# Patient Record
Sex: Female | Born: 1935 | Race: White | Hispanic: No | Marital: Married | Smoking: Current every day smoker
Health system: Southern US, Community
[De-identification: ages and names within clinical notes are randomized; demographics above are authoritative.]

## PROBLEM LIST (undated history)

## (undated) DIAGNOSIS — E559 Vitamin D deficiency, unspecified: Secondary | ICD-10-CM

## (undated) DIAGNOSIS — K2970 Gastritis, unspecified, without bleeding: Secondary | ICD-10-CM

## (undated) DIAGNOSIS — I341 Nonrheumatic mitral (valve) prolapse: Secondary | ICD-10-CM

## (undated) DIAGNOSIS — F0390 Unspecified dementia without behavioral disturbance: Secondary | ICD-10-CM

## (undated) DIAGNOSIS — Z720 Tobacco use: Secondary | ICD-10-CM

## (undated) DIAGNOSIS — IMO0002 Reserved for concepts with insufficient information to code with codable children: Secondary | ICD-10-CM

## (undated) DIAGNOSIS — F1010 Alcohol abuse, uncomplicated: Secondary | ICD-10-CM

## (undated) DIAGNOSIS — S72141A Displaced intertrochanteric fracture of right femur, initial encounter for closed fracture: Secondary | ICD-10-CM

## (undated) DIAGNOSIS — I219 Acute myocardial infarction, unspecified: Secondary | ICD-10-CM

## (undated) DIAGNOSIS — H3530 Unspecified macular degeneration: Secondary | ICD-10-CM

## (undated) DIAGNOSIS — G8929 Other chronic pain: Secondary | ICD-10-CM

## (undated) DIAGNOSIS — E43 Unspecified severe protein-calorie malnutrition: Secondary | ICD-10-CM

## (undated) DIAGNOSIS — R634 Abnormal weight loss: Secondary | ICD-10-CM

## (undated) DIAGNOSIS — F32A Depression, unspecified: Secondary | ICD-10-CM

## (undated) DIAGNOSIS — K5790 Diverticulosis of intestine, part unspecified, without perforation or abscess without bleeding: Secondary | ICD-10-CM

## (undated) DIAGNOSIS — I480 Paroxysmal atrial fibrillation: Secondary | ICD-10-CM

## (undated) DIAGNOSIS — G4700 Insomnia, unspecified: Secondary | ICD-10-CM

## (undated) DIAGNOSIS — F341 Dysthymic disorder: Secondary | ICD-10-CM

## (undated) DIAGNOSIS — I10 Essential (primary) hypertension: Secondary | ICD-10-CM

## (undated) DIAGNOSIS — W19XXXA Unspecified fall, initial encounter: Secondary | ICD-10-CM

## (undated) DIAGNOSIS — I452 Bifascicular block: Secondary | ICD-10-CM

## (undated) DIAGNOSIS — M1990 Unspecified osteoarthritis, unspecified site: Secondary | ICD-10-CM

## (undated) DIAGNOSIS — E119 Type 2 diabetes mellitus without complications: Secondary | ICD-10-CM

## (undated) DIAGNOSIS — M549 Dorsalgia, unspecified: Secondary | ICD-10-CM

## (undated) DIAGNOSIS — E785 Hyperlipidemia, unspecified: Secondary | ICD-10-CM

## (undated) DIAGNOSIS — M519 Unspecified thoracic, thoracolumbar and lumbosacral intervertebral disc disorder: Secondary | ICD-10-CM

## (undated) DIAGNOSIS — J449 Chronic obstructive pulmonary disease, unspecified: Secondary | ICD-10-CM

## (undated) DIAGNOSIS — F329 Major depressive disorder, single episode, unspecified: Secondary | ICD-10-CM

## (undated) HISTORY — DX: Reserved for concepts with insufficient information to code with codable children: IMO0002

## (undated) HISTORY — DX: Gastritis, unspecified, without bleeding: K29.70

## (undated) HISTORY — DX: Dorsalgia, unspecified: M54.9

## (undated) HISTORY — DX: Unspecified osteoarthritis, unspecified site: M19.90

## (undated) HISTORY — DX: Displaced intertrochanteric fracture of right femur, initial encounter for closed fracture: S72.141A

## (undated) HISTORY — PX: OTHER SURGICAL HISTORY: SHX169

## (undated) HISTORY — DX: Dysthymic disorder: F34.1

## (undated) HISTORY — PX: KNEE ARTHROSCOPY: SUR90

## (undated) HISTORY — DX: Vitamin D deficiency, unspecified: E55.9

## (undated) HISTORY — DX: Unspecified macular degeneration: H35.30

## (undated) HISTORY — DX: Insomnia, unspecified: G47.00

## (undated) HISTORY — DX: Unspecified dementia without behavioral disturbance: F03.90

## (undated) HISTORY — DX: Hyperlipidemia, unspecified: E78.5

## (undated) HISTORY — DX: Major depressive disorder, single episode, unspecified: F32.9

## (undated) HISTORY — DX: Abnormal weight loss: R63.4

## (undated) HISTORY — DX: Acute myocardial infarction, unspecified: I21.9

## (undated) HISTORY — DX: Alcohol abuse, uncomplicated: F10.10

## (undated) HISTORY — DX: Paroxysmal atrial fibrillation: I48.0

## (undated) HISTORY — DX: Other chronic pain: G89.29

## (undated) HISTORY — DX: Unspecified fall, initial encounter: W19.XXXA

## (undated) HISTORY — DX: Diverticulosis of intestine, part unspecified, without perforation or abscess without bleeding: K57.90

## (undated) HISTORY — DX: Nonrheumatic mitral (valve) prolapse: I34.1

## (undated) HISTORY — DX: Unspecified thoracic, thoracolumbar and lumbosacral intervertebral disc disorder: M51.9

## (undated) HISTORY — DX: Bifascicular block: I45.2

## (undated) HISTORY — DX: Chronic obstructive pulmonary disease, unspecified: J44.9

## (undated) HISTORY — DX: Tobacco use: Z72.0

## (undated) HISTORY — DX: Type 2 diabetes mellitus without complications: E11.9

## (undated) HISTORY — DX: Essential (primary) hypertension: I10

## (undated) HISTORY — DX: Depression, unspecified: F32.A

## (undated) HISTORY — DX: Unspecified severe protein-calorie malnutrition: E43

---

## 1997-09-01 ENCOUNTER — Inpatient Hospital Stay (HOSPITAL_COMMUNITY): Admission: EM | Admit: 1997-09-01 | Discharge: 1997-09-04 | Payer: Self-pay | Admitting: Emergency Medicine

## 1999-01-12 ENCOUNTER — Other Ambulatory Visit: Admission: RE | Admit: 1999-01-12 | Discharge: 1999-01-12 | Payer: Self-pay | Admitting: *Deleted

## 1999-06-17 ENCOUNTER — Encounter: Payer: Self-pay | Admitting: Family Medicine

## 1999-06-17 ENCOUNTER — Encounter: Admission: RE | Admit: 1999-06-17 | Discharge: 1999-06-17 | Payer: Self-pay | Admitting: Family Medicine

## 2001-12-14 ENCOUNTER — Encounter: Payer: Self-pay | Admitting: Orthopedic Surgery

## 2001-12-14 ENCOUNTER — Encounter: Admission: RE | Admit: 2001-12-14 | Discharge: 2001-12-14 | Payer: Self-pay | Admitting: Orthopedic Surgery

## 2001-12-27 ENCOUNTER — Encounter: Admission: RE | Admit: 2001-12-27 | Discharge: 2001-12-27 | Payer: Self-pay | Admitting: Orthopedic Surgery

## 2001-12-27 ENCOUNTER — Encounter: Payer: Self-pay | Admitting: Orthopedic Surgery

## 2002-01-17 ENCOUNTER — Encounter: Payer: Self-pay | Admitting: Orthopedic Surgery

## 2002-01-17 ENCOUNTER — Encounter: Admission: RE | Admit: 2002-01-17 | Discharge: 2002-01-17 | Payer: Self-pay | Admitting: Orthopedic Surgery

## 2002-02-07 ENCOUNTER — Encounter: Admission: RE | Admit: 2002-02-07 | Discharge: 2002-02-07 | Payer: Self-pay | Admitting: Orthopedic Surgery

## 2002-02-07 ENCOUNTER — Encounter: Payer: Self-pay | Admitting: Orthopedic Surgery

## 2003-01-15 ENCOUNTER — Encounter: Admission: RE | Admit: 2003-01-15 | Discharge: 2003-01-15 | Payer: Self-pay | Admitting: Orthopedic Surgery

## 2003-01-15 ENCOUNTER — Encounter: Payer: Self-pay | Admitting: Orthopedic Surgery

## 2003-02-06 ENCOUNTER — Encounter: Admission: RE | Admit: 2003-02-06 | Discharge: 2003-02-06 | Payer: Self-pay | Admitting: Orthopedic Surgery

## 2003-02-06 ENCOUNTER — Encounter: Payer: Self-pay | Admitting: Orthopedic Surgery

## 2003-03-21 ENCOUNTER — Encounter: Admission: RE | Admit: 2003-03-21 | Discharge: 2003-03-21 | Payer: Self-pay | Admitting: Orthopedic Surgery

## 2003-03-25 ENCOUNTER — Emergency Department (HOSPITAL_COMMUNITY): Admission: EM | Admit: 2003-03-25 | Discharge: 2003-03-25 | Payer: Self-pay | Admitting: Emergency Medicine

## 2004-03-16 ENCOUNTER — Ambulatory Visit: Payer: Self-pay | Admitting: Gastroenterology

## 2004-03-30 ENCOUNTER — Encounter (INDEPENDENT_AMBULATORY_CARE_PROVIDER_SITE_OTHER): Payer: Self-pay | Admitting: *Deleted

## 2004-03-30 ENCOUNTER — Ambulatory Visit: Payer: Self-pay | Admitting: Gastroenterology

## 2004-03-31 ENCOUNTER — Ambulatory Visit: Payer: Self-pay | Admitting: Gastroenterology

## 2004-05-27 ENCOUNTER — Ambulatory Visit: Payer: Self-pay | Admitting: Family Medicine

## 2004-06-28 ENCOUNTER — Ambulatory Visit: Payer: Self-pay

## 2005-09-08 ENCOUNTER — Ambulatory Visit: Payer: Self-pay | Admitting: Family Medicine

## 2006-03-02 ENCOUNTER — Ambulatory Visit: Payer: Self-pay | Admitting: Family Medicine

## 2006-03-02 LAB — CONVERTED CEMR LAB
ALT: 24 units/L (ref 0–40)
AST: 34 units/L (ref 0–37)
BUN: 11 mg/dL (ref 6–23)
Basophils Relative: 0.4 % (ref 0.0–1.0)
CO2: 30 meq/L (ref 19–32)
Calcium: 9.5 mg/dL (ref 8.4–10.5)
Chloride: 98 meq/L (ref 96–112)
Chol/HDL Ratio, serum: 2.7
Creatinine, Ser: 0.6 mg/dL (ref 0.4–1.2)
Eosinophil percent: 2.4 % (ref 0.0–5.0)
Microalb Creat Ratio: 15.5 mg/g (ref 0.0–30.0)
Microalb, Ur: 2.9 mg/dL — ABNORMAL HIGH (ref 0.0–1.9)
Platelets: 267 10*3/uL (ref 150–400)
RBC: 3.82 M/uL — ABNORMAL LOW (ref 3.87–5.11)
RDW: 12.5 % (ref 11.5–14.6)
Triglyceride fasting, serum: 189 mg/dL — ABNORMAL HIGH (ref 0–149)
VLDL: 38 mg/dL (ref 0–40)
WBC: 9.3 10*3/uL (ref 4.5–10.5)

## 2006-12-05 ENCOUNTER — Telehealth (INDEPENDENT_AMBULATORY_CARE_PROVIDER_SITE_OTHER): Payer: Self-pay | Admitting: *Deleted

## 2006-12-12 ENCOUNTER — Encounter: Payer: Self-pay | Admitting: Family Medicine

## 2007-01-25 ENCOUNTER — Telehealth: Payer: Self-pay | Admitting: Family Medicine

## 2007-02-06 ENCOUNTER — Telehealth: Payer: Self-pay | Admitting: Family Medicine

## 2007-03-08 ENCOUNTER — Telehealth: Payer: Self-pay | Admitting: Family Medicine

## 2007-03-22 ENCOUNTER — Telehealth: Payer: Self-pay | Admitting: Family Medicine

## 2007-03-27 ENCOUNTER — Telehealth: Payer: Self-pay | Admitting: Family Medicine

## 2007-04-09 ENCOUNTER — Telehealth: Payer: Self-pay | Admitting: Family Medicine

## 2007-04-10 ENCOUNTER — Telehealth: Payer: Self-pay | Admitting: Family Medicine

## 2007-05-06 ENCOUNTER — Encounter: Payer: Self-pay | Admitting: Family Medicine

## 2007-05-16 ENCOUNTER — Telehealth: Payer: Self-pay | Admitting: Family Medicine

## 2007-05-23 ENCOUNTER — Telehealth: Payer: Self-pay | Admitting: Family Medicine

## 2007-05-30 ENCOUNTER — Ambulatory Visit: Payer: Self-pay | Admitting: Family Medicine

## 2007-05-30 DIAGNOSIS — M129 Arthropathy, unspecified: Secondary | ICD-10-CM | POA: Insufficient documentation

## 2007-05-30 DIAGNOSIS — E785 Hyperlipidemia, unspecified: Secondary | ICD-10-CM

## 2007-05-30 DIAGNOSIS — M5137 Other intervertebral disc degeneration, lumbosacral region: Secondary | ICD-10-CM | POA: Insufficient documentation

## 2007-05-30 DIAGNOSIS — D649 Anemia, unspecified: Secondary | ICD-10-CM | POA: Insufficient documentation

## 2007-05-30 DIAGNOSIS — T50995A Adverse effect of other drugs, medicaments and biological substances, initial encounter: Secondary | ICD-10-CM | POA: Insufficient documentation

## 2007-05-30 HISTORY — DX: Hyperlipidemia, unspecified: E78.5

## 2007-05-31 ENCOUNTER — Telehealth: Payer: Self-pay | Admitting: Family Medicine

## 2007-06-04 LAB — CONVERTED CEMR LAB
BUN: 10 mg/dL (ref 6–23)
Basophils Absolute: 0 10*3/uL (ref 0.0–0.1)
Bilirubin, Direct: 0.2 mg/dL (ref 0.0–0.3)
Chloride: 97 meq/L (ref 96–112)
Cholesterol: 126 mg/dL (ref 0–200)
Creatinine, Ser: 0.6 mg/dL (ref 0.4–1.2)
GFR calc Af Amer: 126 mL/min
Glucose, Bld: 100 mg/dL — ABNORMAL HIGH (ref 70–99)
HDL: 41.6 mg/dL (ref >39.0)
Hemoglobin: 15.6 g/dL — ABNORMAL HIGH (ref 12.0–15.0)
Hgb A1c MFr Bld: 6.1 % — ABNORMAL HIGH (ref 4.6–6.0)
LDL Cholesterol: 61 mg/dL (ref 0–99)
MCHC: 36 g/dL (ref 30.0–36.0)
Monocytes Absolute: 0.8 10*3/uL — ABNORMAL HIGH (ref 0.2–0.7)
Monocytes Relative: 8.5 % (ref 3.0–11.0)
Neutro Abs: 6.2 10*3/uL (ref 1.4–7.7)
Potassium: 4.9 meq/L (ref 3.5–5.1)
RDW: 12.1 % (ref 11.5–14.6)
Sodium: 139 meq/L (ref 135–145)
Total Bilirubin: 1.2 mg/dL (ref 0.3–1.2)
Total Protein: 7.3 g/dL (ref 6.0–8.3)

## 2007-06-05 ENCOUNTER — Telehealth: Payer: Self-pay | Admitting: Family Medicine

## 2007-07-03 ENCOUNTER — Telehealth: Payer: Self-pay | Admitting: Family Medicine

## 2007-07-17 ENCOUNTER — Telehealth: Payer: Self-pay | Admitting: Family Medicine

## 2007-07-18 ENCOUNTER — Ambulatory Visit: Payer: Self-pay | Admitting: Cardiology

## 2007-07-18 ENCOUNTER — Inpatient Hospital Stay (HOSPITAL_COMMUNITY): Admission: EM | Admit: 2007-07-18 | Discharge: 2007-07-21 | Payer: Self-pay | Admitting: Emergency Medicine

## 2007-07-18 ENCOUNTER — Ambulatory Visit: Payer: Self-pay | Admitting: Family Medicine

## 2007-07-18 DIAGNOSIS — I4891 Unspecified atrial fibrillation: Secondary | ICD-10-CM

## 2007-07-20 ENCOUNTER — Encounter: Payer: Self-pay | Admitting: Cardiovascular Disease

## 2007-07-24 ENCOUNTER — Ambulatory Visit: Payer: Self-pay | Admitting: Cardiology

## 2007-07-26 ENCOUNTER — Telehealth: Payer: Self-pay | Admitting: Family Medicine

## 2007-07-30 ENCOUNTER — Ambulatory Visit: Payer: Self-pay | Admitting: Internal Medicine

## 2007-08-06 ENCOUNTER — Telehealth: Payer: Self-pay | Admitting: Family Medicine

## 2007-08-20 ENCOUNTER — Ambulatory Visit: Payer: Self-pay | Admitting: Cardiovascular Disease

## 2007-08-22 ENCOUNTER — Telehealth: Payer: Self-pay | Admitting: Family Medicine

## 2007-08-31 ENCOUNTER — Ambulatory Visit: Payer: Self-pay | Admitting: Internal Medicine

## 2007-08-31 ENCOUNTER — Encounter: Payer: Self-pay | Admitting: Family Medicine

## 2007-09-03 ENCOUNTER — Telehealth: Payer: Self-pay | Admitting: Family Medicine

## 2007-09-04 ENCOUNTER — Telehealth: Payer: Self-pay | Admitting: Family Medicine

## 2007-09-05 ENCOUNTER — Ambulatory Visit: Payer: Self-pay

## 2007-09-05 ENCOUNTER — Ambulatory Visit: Payer: Self-pay | Admitting: Cardiology

## 2007-09-07 ENCOUNTER — Ambulatory Visit: Payer: Self-pay | Admitting: Cardiovascular Disease

## 2007-09-07 ENCOUNTER — Inpatient Hospital Stay (HOSPITAL_COMMUNITY): Admission: EM | Admit: 2007-09-07 | Discharge: 2007-09-18 | Payer: Self-pay | Admitting: Emergency Medicine

## 2007-09-12 ENCOUNTER — Ambulatory Visit: Payer: Self-pay | Admitting: Physical Medicine & Rehabilitation

## 2007-09-18 ENCOUNTER — Telehealth: Payer: Self-pay | Admitting: Family Medicine

## 2007-09-19 ENCOUNTER — Telehealth: Payer: Self-pay | Admitting: Family Medicine

## 2007-09-20 ENCOUNTER — Ambulatory Visit: Payer: Self-pay | Admitting: Internal Medicine

## 2007-09-24 ENCOUNTER — Telehealth: Payer: Self-pay | Admitting: Family Medicine

## 2007-09-26 ENCOUNTER — Ambulatory Visit: Payer: Self-pay | Admitting: Cardiology

## 2007-09-27 ENCOUNTER — Telehealth: Payer: Self-pay | Admitting: Family Medicine

## 2007-10-16 ENCOUNTER — Ambulatory Visit: Payer: Self-pay | Admitting: Internal Medicine

## 2007-10-16 ENCOUNTER — Ambulatory Visit: Payer: Self-pay | Admitting: Cardiology

## 2007-10-31 ENCOUNTER — Ambulatory Visit: Payer: Self-pay | Admitting: Internal Medicine

## 2007-11-06 ENCOUNTER — Telehealth: Payer: Self-pay | Admitting: Family Medicine

## 2007-11-30 ENCOUNTER — Ambulatory Visit: Payer: Self-pay | Admitting: Cardiology

## 2007-12-18 ENCOUNTER — Ambulatory Visit: Payer: Self-pay | Admitting: Cardiology

## 2007-12-31 ENCOUNTER — Ambulatory Visit: Payer: Self-pay | Admitting: Cardiology

## 2008-01-08 ENCOUNTER — Telehealth: Payer: Self-pay | Admitting: Family Medicine

## 2008-01-24 ENCOUNTER — Ambulatory Visit: Payer: Self-pay | Admitting: Cardiology

## 2008-02-13 ENCOUNTER — Telehealth: Payer: Self-pay | Admitting: Family Medicine

## 2008-02-19 ENCOUNTER — Telehealth: Payer: Self-pay | Admitting: Family Medicine

## 2008-02-22 ENCOUNTER — Ambulatory Visit: Payer: Self-pay | Admitting: Internal Medicine

## 2008-03-14 ENCOUNTER — Ambulatory Visit: Payer: Self-pay | Admitting: Internal Medicine

## 2008-03-20 ENCOUNTER — Ambulatory Visit: Payer: Self-pay | Admitting: Family Medicine

## 2008-03-20 DIAGNOSIS — J309 Allergic rhinitis, unspecified: Secondary | ICD-10-CM | POA: Insufficient documentation

## 2008-03-20 DIAGNOSIS — E559 Vitamin D deficiency, unspecified: Secondary | ICD-10-CM | POA: Insufficient documentation

## 2008-03-20 DIAGNOSIS — M954 Acquired deformity of chest and rib: Secondary | ICD-10-CM | POA: Insufficient documentation

## 2008-03-20 DIAGNOSIS — M545 Low back pain: Secondary | ICD-10-CM | POA: Insufficient documentation

## 2008-03-20 DIAGNOSIS — E039 Hypothyroidism, unspecified: Secondary | ICD-10-CM | POA: Insufficient documentation

## 2008-03-20 DIAGNOSIS — E1149 Type 2 diabetes mellitus with other diabetic neurological complication: Secondary | ICD-10-CM | POA: Insufficient documentation

## 2008-03-20 HISTORY — DX: Vitamin D deficiency, unspecified: E55.9

## 2008-03-20 LAB — CONVERTED CEMR LAB
Blood in Urine, dipstick: NEGATIVE
Nitrite: NEGATIVE
Urobilinogen, UA: 0.2

## 2008-03-21 ENCOUNTER — Telehealth: Payer: Self-pay | Admitting: Family Medicine

## 2008-03-25 ENCOUNTER — Telehealth: Payer: Self-pay | Admitting: Family Medicine

## 2008-03-26 LAB — CONVERTED CEMR LAB
AST: 46 units/L — ABNORMAL HIGH (ref 0–37)
Albumin: 4.3 g/dL (ref 3.5–5.2)
Alkaline Phosphatase: 87 units/L (ref 39–117)
BUN: 17 mg/dL (ref 6–23)
Basophils Absolute: 0 10*3/uL (ref 0.0–0.1)
Chloride: 102 meq/L (ref 96–112)
Creatinine, Ser: 0.5 mg/dL (ref 0.4–1.2)
Eosinophils Absolute: 0.2 10*3/uL (ref 0.0–0.7)
Eosinophils Relative: 2.9 % (ref 0.0–5.0)
Glucose, Bld: 125 mg/dL — ABNORMAL HIGH (ref 70–99)
HDL: 49.2 mg/dL (ref >39.0)
MCHC: 35.6 g/dL (ref 30.0–36.0)
MCV: 103.4 fL — ABNORMAL HIGH (ref 78.0–100.0)
Neutrophils Relative %: 53.2 % (ref 43.0–77.0)
Platelets: 192 10*3/uL (ref 150–400)
Potassium: 4.5 meq/L (ref 3.5–5.1)
Total Protein: 7.1 g/dL (ref 6.0–8.3)
VLDL: 22 mg/dL (ref 0–40)
Vit D, 1,25-Dihydroxy: 12 — ABNORMAL LOW (ref 30–89)
WBC: 8.2 10*3/uL (ref 4.5–10.5)

## 2008-04-03 ENCOUNTER — Ambulatory Visit: Payer: Self-pay | Admitting: Cardiology

## 2008-04-03 ENCOUNTER — Telehealth: Payer: Self-pay | Admitting: Family Medicine

## 2008-04-25 ENCOUNTER — Ambulatory Visit: Payer: Self-pay | Admitting: Cardiovascular Disease

## 2008-05-15 ENCOUNTER — Ambulatory Visit: Payer: Self-pay | Admitting: Family Medicine

## 2008-05-23 ENCOUNTER — Ambulatory Visit: Payer: Self-pay | Admitting: Cardiology

## 2008-06-04 ENCOUNTER — Telehealth: Payer: Self-pay | Admitting: Family Medicine

## 2008-06-18 ENCOUNTER — Ambulatory Visit: Payer: Self-pay | Admitting: Cardiology

## 2008-06-27 ENCOUNTER — Ambulatory Visit: Payer: Self-pay | Admitting: Internal Medicine

## 2008-07-11 ENCOUNTER — Ambulatory Visit: Payer: Self-pay | Admitting: Internal Medicine

## 2008-07-11 ENCOUNTER — Ambulatory Visit: Payer: Self-pay

## 2008-07-29 ENCOUNTER — Ambulatory Visit: Payer: Self-pay | Admitting: Internal Medicine

## 2008-08-12 ENCOUNTER — Telehealth: Payer: Self-pay | Admitting: Family Medicine

## 2008-08-19 ENCOUNTER — Telehealth: Payer: Self-pay | Admitting: Family Medicine

## 2008-08-19 ENCOUNTER — Ambulatory Visit: Payer: Self-pay | Admitting: Cardiovascular Disease

## 2008-09-16 ENCOUNTER — Ambulatory Visit: Payer: Self-pay | Admitting: Internal Medicine

## 2008-09-24 ENCOUNTER — Telehealth: Payer: Self-pay | Admitting: Family Medicine

## 2008-09-30 ENCOUNTER — Telehealth: Payer: Self-pay | Admitting: Family Medicine

## 2008-10-09 ENCOUNTER — Telehealth: Payer: Self-pay | Admitting: Family Medicine

## 2008-10-10 ENCOUNTER — Ambulatory Visit: Payer: Self-pay | Admitting: Internal Medicine

## 2008-10-15 ENCOUNTER — Encounter: Payer: Self-pay | Admitting: *Deleted

## 2008-11-06 ENCOUNTER — Ambulatory Visit: Payer: Self-pay | Admitting: Internal Medicine

## 2008-11-13 ENCOUNTER — Telehealth (INDEPENDENT_AMBULATORY_CARE_PROVIDER_SITE_OTHER): Payer: Self-pay | Admitting: *Deleted

## 2008-11-19 ENCOUNTER — Encounter: Payer: Self-pay | Admitting: *Deleted

## 2008-12-01 ENCOUNTER — Telehealth: Payer: Self-pay | Admitting: Family Medicine

## 2008-12-02 ENCOUNTER — Ambulatory Visit: Payer: Self-pay | Admitting: Cardiology

## 2008-12-02 LAB — CONVERTED CEMR LAB: POC INR: 2.5

## 2008-12-30 ENCOUNTER — Ambulatory Visit: Payer: Self-pay | Admitting: Cardiovascular Disease

## 2008-12-30 LAB — CONVERTED CEMR LAB: POC INR: 2.8

## 2009-01-27 ENCOUNTER — Telehealth: Payer: Self-pay | Admitting: Family Medicine

## 2009-01-29 ENCOUNTER — Ambulatory Visit: Payer: Self-pay | Admitting: Cardiology

## 2009-02-10 ENCOUNTER — Telehealth: Payer: Self-pay | Admitting: Family Medicine

## 2009-02-18 ENCOUNTER — Telehealth: Payer: Self-pay | Admitting: Family Medicine

## 2009-02-24 ENCOUNTER — Encounter (INDEPENDENT_AMBULATORY_CARE_PROVIDER_SITE_OTHER): Payer: Self-pay | Admitting: *Deleted

## 2009-03-11 ENCOUNTER — Ambulatory Visit: Payer: Self-pay | Admitting: Internal Medicine

## 2009-03-11 ENCOUNTER — Ambulatory Visit: Payer: Self-pay | Admitting: Cardiology

## 2009-03-11 DIAGNOSIS — I447 Left bundle-branch block, unspecified: Secondary | ICD-10-CM

## 2009-03-11 DIAGNOSIS — I495 Sick sinus syndrome: Secondary | ICD-10-CM

## 2009-03-12 ENCOUNTER — Encounter (INDEPENDENT_AMBULATORY_CARE_PROVIDER_SITE_OTHER): Payer: Self-pay | Admitting: *Deleted

## 2009-03-20 ENCOUNTER — Encounter (INDEPENDENT_AMBULATORY_CARE_PROVIDER_SITE_OTHER): Payer: Self-pay | Admitting: *Deleted

## 2009-03-24 ENCOUNTER — Telehealth: Payer: Self-pay | Admitting: Family Medicine

## 2009-04-01 ENCOUNTER — Telehealth: Payer: Self-pay | Admitting: Family Medicine

## 2009-04-13 ENCOUNTER — Encounter: Payer: Self-pay | Admitting: Family Medicine

## 2009-04-16 ENCOUNTER — Ambulatory Visit: Payer: Self-pay | Admitting: Cardiovascular Disease

## 2009-04-23 ENCOUNTER — Encounter (INDEPENDENT_AMBULATORY_CARE_PROVIDER_SITE_OTHER): Payer: Self-pay | Admitting: *Deleted

## 2009-04-29 ENCOUNTER — Ambulatory Visit: Payer: Self-pay | Admitting: Family Medicine

## 2009-04-29 LAB — CONVERTED CEMR LAB
Bilirubin Urine: NEGATIVE
Blood in Urine, dipstick: NEGATIVE
Glucose, Urine, Semiquant: NEGATIVE
Ketones, urine, test strip: NEGATIVE
Nitrite: NEGATIVE
Specific Gravity, Urine: 1.02
pH: 6

## 2009-05-06 LAB — CONVERTED CEMR LAB
Albumin: 4.3 g/dL (ref 3.5–5.2)
Alkaline Phosphatase: 57 units/L (ref 39–117)
Basophils Relative: 0.2 % (ref 0.0–3.0)
CO2: 31 meq/L (ref 19–32)
Calcium: 9.4 mg/dL (ref 8.4–10.5)
Chloride: 102 meq/L (ref 96–112)
Cholesterol: 131 mg/dL (ref 0–200)
Eosinophils Absolute: 0.2 10*3/uL (ref 0.0–0.7)
Glucose, Bld: 78 mg/dL (ref 70–99)
HCT: 43.9 % (ref 36.0–46.0)
HDL: 46.2 mg/dL (ref >39.00)
Hemoglobin: 14.7 g/dL (ref 12.0–15.0)
Lymphocytes Relative: 25.8 % (ref 12.0–46.0)
Lymphs Abs: 2.2 10*3/uL (ref 0.7–4.0)
MCHC: 33.4 g/dL (ref 30.0–36.0)
MCV: 106.9 fL — ABNORMAL HIGH (ref 78.0–100.0)
Microalb, Ur: 1 mg/dL (ref 0.0–1.9)
Neutro Abs: 5.2 10*3/uL (ref 1.4–7.7)
Potassium: 4.9 meq/L (ref 3.5–5.1)
RBC: 4.11 M/uL (ref 3.87–5.11)
RDW: 12.8 % (ref 11.5–14.6)
Sodium: 141 meq/L (ref 135–145)
TSH: 1.26 microintl units/mL (ref 0.35–5.50)
Total CHOL/HDL Ratio: 3
Total Protein: 7.5 g/dL (ref 6.0–8.3)
Triglycerides: 168 mg/dL — ABNORMAL HIGH (ref 0.0–149.0)
VLDL: 33.6 mg/dL (ref 0.0–40.0)

## 2009-05-13 ENCOUNTER — Telehealth: Payer: Self-pay | Admitting: Family Medicine

## 2009-05-19 ENCOUNTER — Ambulatory Visit: Payer: Self-pay | Admitting: Cardiology

## 2009-05-19 ENCOUNTER — Encounter (INDEPENDENT_AMBULATORY_CARE_PROVIDER_SITE_OTHER): Payer: Self-pay | Admitting: Cardiology

## 2009-06-07 ENCOUNTER — Observation Stay (HOSPITAL_COMMUNITY): Admission: EM | Admit: 2009-06-07 | Discharge: 2009-06-08 | Payer: Self-pay | Admitting: Emergency Medicine

## 2009-06-10 ENCOUNTER — Telehealth: Payer: Self-pay | Admitting: Family Medicine

## 2009-06-16 ENCOUNTER — Telehealth: Payer: Self-pay | Admitting: Family Medicine

## 2009-06-17 ENCOUNTER — Encounter: Payer: Self-pay | Admitting: Family Medicine

## 2009-06-17 DIAGNOSIS — J449 Chronic obstructive pulmonary disease, unspecified: Secondary | ICD-10-CM

## 2009-06-17 DIAGNOSIS — J4489 Other specified chronic obstructive pulmonary disease: Secondary | ICD-10-CM | POA: Insufficient documentation

## 2009-06-18 ENCOUNTER — Ambulatory Visit: Payer: Self-pay | Admitting: Cardiology

## 2009-06-24 ENCOUNTER — Telehealth: Payer: Self-pay | Admitting: Family Medicine

## 2009-07-02 ENCOUNTER — Telehealth: Payer: Self-pay | Admitting: Family Medicine

## 2009-07-06 ENCOUNTER — Telehealth: Payer: Self-pay | Admitting: Cardiology

## 2009-07-09 ENCOUNTER — Telehealth: Payer: Self-pay | Admitting: Family Medicine

## 2009-07-15 ENCOUNTER — Telehealth: Payer: Self-pay | Admitting: Family Medicine

## 2009-07-16 ENCOUNTER — Telehealth: Payer: Self-pay | Admitting: Family Medicine

## 2009-07-20 ENCOUNTER — Ambulatory Visit: Payer: Self-pay | Admitting: Cardiovascular Disease

## 2009-07-21 ENCOUNTER — Inpatient Hospital Stay (HOSPITAL_COMMUNITY): Admission: EM | Admit: 2009-07-21 | Discharge: 2009-07-25 | Payer: Self-pay | Admitting: Emergency Medicine

## 2009-07-21 ENCOUNTER — Encounter (INDEPENDENT_AMBULATORY_CARE_PROVIDER_SITE_OTHER): Payer: Self-pay | Admitting: Internal Medicine

## 2009-07-28 ENCOUNTER — Telehealth (INDEPENDENT_AMBULATORY_CARE_PROVIDER_SITE_OTHER): Payer: Self-pay | Admitting: *Deleted

## 2009-07-28 ENCOUNTER — Telehealth: Payer: Self-pay | Admitting: Family Medicine

## 2009-07-29 ENCOUNTER — Ambulatory Visit: Payer: Self-pay | Admitting: Pulmonary Disease

## 2009-07-29 ENCOUNTER — Encounter: Payer: Self-pay | Admitting: Internal Medicine

## 2009-07-29 DIAGNOSIS — R0602 Shortness of breath: Secondary | ICD-10-CM | POA: Insufficient documentation

## 2009-07-29 LAB — CONVERTED CEMR LAB: POC INR: 3

## 2009-08-11 ENCOUNTER — Telehealth: Payer: Self-pay | Admitting: Internal Medicine

## 2009-08-11 ENCOUNTER — Telehealth: Payer: Self-pay | Admitting: Family Medicine

## 2009-09-01 ENCOUNTER — Telehealth: Payer: Self-pay | Admitting: Family Medicine

## 2009-09-02 ENCOUNTER — Telehealth: Payer: Self-pay | Admitting: Family Medicine

## 2009-09-07 ENCOUNTER — Ambulatory Visit: Payer: Self-pay | Admitting: Cardiovascular Disease

## 2009-09-16 ENCOUNTER — Observation Stay (HOSPITAL_COMMUNITY): Admission: EM | Admit: 2009-09-16 | Discharge: 2009-09-18 | Payer: Self-pay | Admitting: Emergency Medicine

## 2009-09-17 ENCOUNTER — Telehealth: Payer: Self-pay | Admitting: Family Medicine

## 2009-09-18 ENCOUNTER — Telehealth: Payer: Self-pay | Admitting: Cardiovascular Disease

## 2009-09-23 ENCOUNTER — Ambulatory Visit: Payer: Self-pay | Admitting: Internal Medicine

## 2009-09-24 ENCOUNTER — Telehealth: Payer: Self-pay | Admitting: Family Medicine

## 2009-09-29 ENCOUNTER — Telehealth: Payer: Self-pay | Admitting: Family Medicine

## 2009-10-01 ENCOUNTER — Ambulatory Visit: Payer: Self-pay | Admitting: Family Medicine

## 2009-10-01 DIAGNOSIS — F341 Dysthymic disorder: Secondary | ICD-10-CM

## 2009-10-01 DIAGNOSIS — IMO0001 Reserved for inherently not codable concepts without codable children: Secondary | ICD-10-CM

## 2009-10-01 DIAGNOSIS — D126 Benign neoplasm of colon, unspecified: Secondary | ICD-10-CM

## 2009-10-01 HISTORY — DX: Dysthymic disorder: F34.1

## 2009-10-06 ENCOUNTER — Telehealth: Payer: Self-pay | Admitting: Family Medicine

## 2009-10-06 LAB — CONVERTED CEMR LAB
ALT: 13 units/L (ref 0–35)
Alkaline Phosphatase: 68 units/L (ref 39–117)
Bilirubin, Direct: 0.2 mg/dL (ref 0.0–0.3)
Cholesterol: 139 mg/dL (ref 0–200)
Total Protein: 6.9 g/dL (ref 6.0–8.3)
VLDL: 30 mg/dL (ref 0.0–40.0)

## 2009-10-07 ENCOUNTER — Telehealth: Payer: Self-pay | Admitting: Family Medicine

## 2009-10-09 ENCOUNTER — Encounter (INDEPENDENT_AMBULATORY_CARE_PROVIDER_SITE_OTHER): Payer: Self-pay | Admitting: *Deleted

## 2009-10-16 ENCOUNTER — Ambulatory Visit: Payer: Self-pay | Admitting: Cardiovascular Disease

## 2009-10-16 LAB — CONVERTED CEMR LAB: POC INR: 3.3

## 2009-10-22 ENCOUNTER — Encounter: Payer: Self-pay | Admitting: Family Medicine

## 2009-10-30 ENCOUNTER — Ambulatory Visit: Payer: Self-pay | Admitting: Cardiology

## 2009-10-30 LAB — CONVERTED CEMR LAB: POC INR: 1.7

## 2009-11-10 ENCOUNTER — Ambulatory Visit: Payer: Self-pay | Admitting: Internal Medicine

## 2009-12-10 ENCOUNTER — Ambulatory Visit: Payer: Self-pay | Admitting: Internal Medicine

## 2009-12-10 LAB — CONVERTED CEMR LAB: POC INR: 2.2

## 2009-12-14 ENCOUNTER — Ambulatory Visit: Payer: Self-pay | Admitting: Gastroenterology

## 2009-12-14 ENCOUNTER — Telehealth: Payer: Self-pay | Admitting: Internal Medicine

## 2009-12-14 DIAGNOSIS — R198 Other specified symptoms and signs involving the digestive system and abdomen: Secondary | ICD-10-CM

## 2009-12-18 ENCOUNTER — Telehealth: Payer: Self-pay | Admitting: Internal Medicine

## 2009-12-24 ENCOUNTER — Ambulatory Visit: Payer: Self-pay | Admitting: Gastroenterology

## 2009-12-25 LAB — CONVERTED CEMR LAB: OCCULT 1: NEGATIVE

## 2010-01-06 ENCOUNTER — Ambulatory Visit: Payer: Self-pay | Admitting: Internal Medicine

## 2010-02-03 ENCOUNTER — Ambulatory Visit: Payer: Self-pay | Admitting: Cardiology

## 2010-02-03 ENCOUNTER — Telehealth: Payer: Self-pay | Admitting: Family Medicine

## 2010-02-03 LAB — CONVERTED CEMR LAB: POC INR: 2.2

## 2010-02-11 ENCOUNTER — Telehealth: Payer: Self-pay | Admitting: Gastroenterology

## 2010-02-12 ENCOUNTER — Encounter: Payer: Self-pay | Admitting: Gastroenterology

## 2010-02-19 ENCOUNTER — Telehealth: Payer: Self-pay | Admitting: Gastroenterology

## 2010-03-03 ENCOUNTER — Telehealth: Payer: Self-pay | Admitting: Gastroenterology

## 2010-03-03 ENCOUNTER — Ambulatory Visit: Payer: Self-pay | Admitting: Gastroenterology

## 2010-03-07 ENCOUNTER — Encounter: Payer: Self-pay | Admitting: Gastroenterology

## 2010-03-09 ENCOUNTER — Telehealth: Payer: Self-pay | Admitting: Family Medicine

## 2010-03-12 ENCOUNTER — Telehealth: Payer: Self-pay | Admitting: Family Medicine

## 2010-03-15 ENCOUNTER — Ambulatory Visit: Payer: Self-pay | Admitting: Cardiology

## 2010-03-15 ENCOUNTER — Ambulatory Visit: Payer: Self-pay | Admitting: Internal Medicine

## 2010-03-16 ENCOUNTER — Telehealth: Payer: Self-pay | Admitting: Internal Medicine

## 2010-03-17 ENCOUNTER — Ambulatory Visit: Payer: Self-pay | Admitting: Family Medicine

## 2010-03-17 DIAGNOSIS — N63 Unspecified lump in unspecified breast: Secondary | ICD-10-CM | POA: Insufficient documentation

## 2010-03-24 ENCOUNTER — Encounter: Payer: Self-pay | Admitting: Family Medicine

## 2010-04-01 ENCOUNTER — Ambulatory Visit: Payer: Self-pay | Admitting: Cardiovascular Disease

## 2010-04-01 LAB — CONVERTED CEMR LAB: POC INR: 1.9

## 2010-04-12 ENCOUNTER — Telehealth: Payer: Self-pay | Admitting: *Deleted

## 2010-04-12 ENCOUNTER — Telehealth: Payer: Self-pay | Admitting: Family Medicine

## 2010-04-22 ENCOUNTER — Ambulatory Visit: Payer: Self-pay

## 2010-04-28 ENCOUNTER — Ambulatory Visit: Payer: Self-pay | Admitting: Cardiology

## 2010-04-28 LAB — CONVERTED CEMR LAB: POC INR: 2.6

## 2010-05-04 ENCOUNTER — Telehealth: Payer: Self-pay | Admitting: Family Medicine

## 2010-05-06 ENCOUNTER — Telehealth: Payer: Self-pay | Admitting: Family Medicine

## 2010-05-11 ENCOUNTER — Telehealth: Payer: Self-pay | Admitting: Family Medicine

## 2010-05-26 ENCOUNTER — Ambulatory Visit: Admission: RE | Admit: 2010-05-26 | Discharge: 2010-05-26 | Payer: Self-pay | Source: Home / Self Care

## 2010-05-31 ENCOUNTER — Ambulatory Visit
Admission: RE | Admit: 2010-05-31 | Discharge: 2010-05-31 | Payer: Self-pay | Source: Home / Self Care | Attending: Internal Medicine | Admitting: Internal Medicine

## 2010-05-31 ENCOUNTER — Telehealth: Payer: Self-pay | Admitting: Internal Medicine

## 2010-06-06 ENCOUNTER — Encounter: Payer: Self-pay | Admitting: Orthopedic Surgery

## 2010-06-09 ENCOUNTER — Ambulatory Visit
Admission: RE | Admit: 2010-06-09 | Discharge: 2010-06-09 | Payer: Self-pay | Source: Home / Self Care | Attending: Internal Medicine | Admitting: Internal Medicine

## 2010-06-17 NOTE — Letter (Signed)
Summary: Patient Notice-Hyperplastic Polyps  [facility]  [address] [place], [place] [address]   Phone: [phone]  Fax: [phone]        March 07, 2010 MRN: [id number]    [facility] [address] [place], [place]  [address]    Dear [name],  I am pleased to inform you that the colon polyp(s) removed during your recent colonoscopy was (were) found to be hyperplastic. These types of polyps are NOT pre-cancerous.  Should you develop new or worsening symptoms of abdominal pain, bowel habit changes or bleeding from the rectum or bowels, please schedule an evaluation with either your primary care physician or with me.  No further action with gastroenterology is needed at this time.      Please follow-up with your primary care physician for your other      healthcare needs.  Continue treatment plan as outlined the day of your exam.  Please call us if you are having persistent problems or have questions about your condition that have not been fully answered at this time.  Sincerely,  Meryl Dare MD [facility]  This letter has been electronically signed by your physician.  Appended Document: Patient Notice-Hyperplastic Polyps letter mailed

## 2010-06-17 NOTE — Letter (Signed)
Summary: Anticoagulation Modification Letter  [facility]  [address] [place], [place] [address]   Phone: [phone]  Fax: [phone]    December 14, 2009  Re:    [name] DOB:    09-26-1932 MRN:    [id number]    Dear Dr. Graciela Husbands,  We have scheduled the above patient for an endoscopic procedure. Our records show that  he/she is on anticoagulation therapy. Please advise as to how long the patient may come off their therapy of warfarin prior to the scheduled procedure(s) on 12/24/09.   Please fax back/or route the completed form to [place] at [phone].  Thank you for your help with this matter.  Sincerely,  Christie Nottingham CMA Duncan Dull)   Physician Recommendation:  Hold Plavix 7 days prior ________________  Hold Coumadin 5 days prior ____________  Other ______________________________     Appended Document: Anticoagulation Modification Letter ok to hold coumadin [name]  Appended Document: Anticoagulation Modification Letter Pt notified to come off coumadin 5 days before procedure. Pt verbalized understanding.

## 2010-06-17 NOTE — Progress Notes (Signed)
Summary: REFILL REQUEST  Phone Note Refill Request Message from:  Patient on May 06, 2010 11:20 AM  Refills Requested: Medication #1:  DURAGESIC-50 50 MCG/HR PT72 1 patch every 3 days for pain   Dosage confirmed as above?Dosage Confirmed   Notes: Pt can be reached at [phone] when Rx is ready for p/u.... Pt has appt for cpx on 09/16/2010  at  10am.  Medication #2:  TEMAZEPAM 30 MG  CAPS 1 capsule at bedtime   Notes: [facility] [address].  Pt has appt for CPX with Dr Scotty Court on 09/16/2010 at  10am.    Initial call taken by: Debbra Riding,  May 06, 2010 11:22 AM    Prescriptions: TEMAZEPAM 30 MG  CAPS (TEMAZEPAM) 1 capsule at bedtime  #30 x 0   Entered by:   Romualdo Bolk, CMA (AAMA)   Authorized by:   Judithann Sheen MD   Signed by:   Romualdo Bolk, CMA (AAMA) on 05/07/2010   Method used:   Print then Give to Patient   RxID:   [id number] DURAGESIC-50 50 MCG/HR PT72 (FENTANYL) 1 patch every 3 days for pain  #10 x 0   Entered by:   Romualdo Bolk, CMA (AAMA)   Authorized by:   Judithann Sheen MD   Signed by:   Romualdo Bolk, CMA (AAMA) on 05/07/2010   Method used:   Print then Give to Patient   RxID:   [id number]

## 2010-06-17 NOTE — Medication Information (Signed)
Summary: rov/jaj   Anticoagulant Therapy  Managed by: Weston Brass, PharmD Referring MD: Olga Millers MD PCP: Dianna Limbo, MD Supervising MD: Juanda Chance MD, Bruce Indication 1: Atrial Fibrillation (ICD-427.31) Indication 2: Atrial Flutter (ICD-427.32) Lab Used: [facility] Site: [facility] INR RANGE 2 - 3           Allergies: 1)  Codeine  Anticoagulation Management History:      Positive risk factors for bleeding include an age of 75 years or older and presence of serious comorbidities.  The bleeding index is 'intermediate risk'.  Positive CHADS2 values include History of Diabetes.  Negative CHADS2 values include Age > 51 years old.  The start date was 07/16/2007.  Her last INR was 3.0 ratio.  Anticoagulation responsible Anayia Eugene: Juanda Chance MD, Smitty Cords.  Exp: 03/2011.    Anticoagulation Management Assessment/Plan:      The patient's current anticoagulation dose is Warfarin sodium 5 mg tabs: Take as directed by coumadin clinic..  The target INR is 2.0-3.0.  The next INR is due 03/15/2010.  Anticoagulation instructions were given to patient.  Results were reviewed/authorized by Weston Brass, PharmD.         Prior Anticoagulation Instructions: INR 2.2  Continue taking 1/2 tablet everyday except take 1 tablet on Mondays and Fridays. Stop Coumadin on 02/26/10 prior to colonscopy. When the doctor doing the procedure tells you to restart the Coumadin, resume your regular dose. Re-check INR on 03/15/10. Call us if you have any questions or problems.   Appended Document: Coumadin Clinic    Anticoagulant Therapy  Managed by: Tammy Sours, PharmD Referring MD: Olga Millers MD PCP: Dianna Limbo, MD Supervising MD: Shirlee Latch MD, Dalton Indication 1: Atrial Fibrillation (ICD-427.31) Indication 2: Atrial Flutter (ICD-427.32) Lab Used: [facility] Site: [facility] INR POC 1.6 INR RANGE 2 - 3  Dietary changes: no    Health status changes: no    Bleeding/hemorrhagic  complications: no    Recent/future hospitalizations: no    Any changes in medication regimen? yes       Details: Dr. Graciela Husbands changed today, pt. does not know the changes.  Recent/future dental: no  Any missed doses?: yes     Details: Pt. stopped taking med 10/14-10/19 for colonoscopy.  Is patient compliant with meds? yes       Allergies: 1)  Codeine  Anticoagulation Management History:      The patient is taking warfarin and comes in today for a routine follow up visit.  Positive risk factors for bleeding include an age of 32 years or older and presence of serious comorbidities.  The bleeding index is 'intermediate risk'.  Positive CHADS2 values include History of Diabetes.  Negative CHADS2 values include Age > 48 years old.  The start date was 07/16/2007.  Her last INR was 3.0 ratio.  Anticoagulation responsible Inaara Tye: Shirlee Latch MD, Dalton.  INR POC: 1.6.  Cuvette Lot#: [id number].  Exp: 03/2011.    Anticoagulation Management Assessment/Plan:      The patient's current anticoagulation dose is Warfarin sodium 5 mg tabs: Take as directed by coumadin clinic..  The target INR is 2.0-3.0.  The next INR is due 03/29/2010.  Anticoagulation instructions were given to patient.  Results were reviewed/authorized by Tammy Sours, PharmD.         Prior Anticoagulation Instructions: INR 2.2  Continue taking 1/2 tablet everyday except take 1 tablet on Mondays and Fridays. Stop Coumadin on 02/26/10 prior to colonscopy. When the doctor doing the procedure tells you to restart the  Coumadin, resume your regular dose. Re-check INR on 03/15/10. Call us if you have any questions or problems.   Current Anticoagulation Instructions: INR 1.6  Take 2 whole tablets today, then resume normal schedule of 1/2 tablet (2.5 mg) everyday except take 1 tablet (5 mg) on Monday and Friday.  Re-check INR in 2 weeks.

## 2010-06-17 NOTE — Letter (Signed)
Summary: Handout Printed  Printed Handout:  - Coumadin Instructions-w/out Meds 

## 2010-06-17 NOTE — Miscellaneous (Signed)
Summary: Orders Update   Clinical Lists Changes  Problems: Added new problem of COPD (ICD-496) Orders: Added new Referral order of Pulmonary Referral (Pulmonary) - Signed

## 2010-06-17 NOTE — Assessment & Plan Note (Signed)
Summary: consult for dyspnea   Copy to:  [facility] Primary Provider/Referring Provider:  Judithann Sheen MD  CC:  Consult for COPD.  History of Present Illness: The pt is a 77y/o female who I have been asked to see for dyspnea.  She has had breathing issues for the past 4-5 mos, and recently was hospitalized for an episode of chest pain unknown etiology.  She has continued to have sob since d/c home, but also continues to smoke.  She has smoked her entire adult life.  Prior to hospitalization, she had a one block doe at moderate pace on flat ground.  She feels better since being in the hospital.  She has minimal cough currently, with scant mucus.  She also describes a lot of spine issues which she thinks affects her mobility more than her breathing.    Current Medications (verified): 1)  Verapamil Hcl Cr 180 Mg Cr-Tabs (Verapamil Hcl) .... Take 1 Tablet By Mouth Two Times A Day 2)  Temazepam 30 Mg  Caps (Temazepam) .Marland Kitchen.. 1 Capsule At Bedtime 3)  Glimepiride 4 Mg Tabs (Glimepiride) .... Take 1/2 Tab By Mouth Two Times A Day 4)  Paxil 40 Mg  Tabs (Paroxetine Hcl) .Marland Kitchen.. 1 By Mouth Once Daily 5)  Duragesic-50 50 Mcg/hr  Pt72 (Fentanyl) .... Take One Patch Every 3 Days Fill For   Jul 15, 2009 6)  Simvastatin 80 Mg Tabs (Simvastatin) .Marland Kitchen.. 1 Hs 7)  Warfarin Sodium 5 Mg Tabs (Warfarin Sodium) .... Take As Directed By Coumadin Clinic. 8)  Metoprolol Tartrate 25 Mg Tabs (Metoprolol Tartrate) .... Take 1/2 Tablet Two Times A Day 9)  Diazepam 5 Mg Tabs (Diazepam) .Marland Kitchen.. 1 By Mouth Four Times A Day As Needed Stress 10)  Hydrocodone-Acetaminophen 10-650 Mg Tabs (Hydrocodone-Acetaminophen) .Marland Kitchen.. 1 By Mouth Every 4-6 Hrs As Needed Pain. Not To Exceed 4 Per Day. 11)  Vitamin D 1000 Unit  Tabs (Cholecalciferol) .... Once Daily 12)  One Touch Ultra Strips .... Test in Am 13)  Metformin Hcl 500 Mg Tabs (Metformin Hcl) .Marland Kitchen.. 1 Bid 14)  Medrol 4 Mg Tabs (Methylprednisolone) .Marland Kitchen.. 1 Once Daily 14 Days Then 1/2 Tab  Once Daily, Increase To 1 Per Day  If Neccessary For Arthritic Pain 15)  Proair Hfa 108 (90 Base) Mcg/act  Aers (Albuterol Sulfate) .Marland Kitchen.. 1-2 Puffs Every 4-6 Hours As Needed 16)  Combivent 18-103 Mcg/act Aero (Ipratropium-Albuterol) .... Inhale 2 Puffs Four Times A Day  Allergies (verified): No Known Drug Allergies  Past History:  Past Medical History: Reviewed history from 06/11/2009 and no changes required. arthritis htn hyperlipideminaia Diabetes mellitus, type I Paroxysmal atrial fibrillation Fatigue Incomplete left bundle-branch block with left axis deviation Chronic back pain Mitral valve prolapse Macular degeneration Esophageal Stricture Gastritis Diverticulosis hyperplastic colon polyps  03/2004 Hemorrhoids  Past Surgical History: Right ankle surgery  Family History: Reviewed history from 08/06/2008 and no changes required. Mother died at age 12, heart disease  father died at age 42, throat cancer.   She does have a sister that is alive and also has atrial  fibrillation.   Social History: Reviewed history from 08/06/2008 and no changes required. Retired --[facility] Married  Tobacco Use - Yes.  1/2 ppd.  started at age 77.   Alcohol Use - yes-Daily  Review of Systems       The patient complains of shortness of breath with activity, productive cough, irregular heartbeats, acid heartburn, depression, joint stiffness or pain, and change in color of  mucus.  The patient denies shortness of breath at rest, non-productive cough, coughing up blood, chest pain, indigestion, loss of appetite, weight change, abdominal pain, difficulty swallowing, sore throat, tooth/dental problems, headaches, nasal congestion/difficulty breathing through nose, sneezing, itching, ear ache, anxiety, hand/feet swelling, rash, and fever.    Vital Signs:  Patient profile:   77 year old female Height:      61 inches Weight:      144.38 pounds BMI:     27.38 O2 Sat:       92 % on Room air Temp:     97.7 degrees F oral Pulse rate:   57 / minute BP sitting:   100 / 60  (left arm) Cuff size:   regular  Vitals Entered By: Abigail Miyamoto RN (July 29, 2009 11:19 AM)  O2 Flow:  Room air CC: Consult for COPD Comments Medications reviewed with patient Arman Filter LPN  July 29, 2009 11:29 AM    Physical Exam  General:  wd female in nad Eyes:  PERRLA and EOMI.   Nose:  patent without discharge Mouth:  clear Neck:  no jvd, tmg, LN Lungs:  mildly decreased bs, no wheezing or rhonchi Heart:  rrr, no mrg Abdomen:  soft and nontender, bs+ Extremities:  no edema or cyanosis pulses intact distally Neurologic:  alert and oriented, moves all 4.   Impression & Recommendations:  Problem # 1:  DYSPNEA (ICD-786.05) the pt is having doe that most likely is multifactorial.  The main question that I have is whether she has underlying obstructive disease related to  her ongoing smoking, or whether she just has chronic airway inflammation that would resolve with smoking cessation.  I have asked her to continue on her current bronchodilators for now, but will re-assess once her pfts are done.  I have also told her it is essential that she stops smoking.  Medications Added to Medication List This Visit: 1)  Glimepiride 4 Mg Tabs (Glimepiride) .... Take 1/2 tab by mouth two times a day 2)  Proair Hfa 108 (90 Base) Mcg/act Aers (Albuterol sulfate) .Marland Kitchen.. 1-2 puffs every 4-6 hours as needed 3)  Combivent 18-103 Mcg/act Aero (Ipratropium-albuterol) .... Inhale 2 puffs four times a day  Other Orders: Consultation Level IV (04540) Pulmonary Referral (Pulmonary) TLB-PT (Protime) (85610-PTP)  Patient Instructions: 1)  continue your taper of medrol as directed previously 2)  no change in breathing meds for now. 3)  will schedule for breathing studies and see you back on same day 4)  no smoking.

## 2010-06-17 NOTE — Progress Notes (Signed)
Summary: requesting a call ASAP  Phone Note Call from Patient   Caller: Patient Call For: Toni Sheen MD Reason for Call: Talk to Doctor Action Taken: Provider Notified Summary of Call: Pt is calling to ask Dr. Scotty Court to call her ASAP.  She was discharged from the hospital this weekend and is having a difficult time with her BS .  States she cannot spend another day like yesterday, and does not want to talk to anyone but Dr. Scotty Court. [phone] Dr. Mena Pauls was her MD while in the hospital, and he changed ALL of her meds. Initial call taken by: Lynann Beaver CMA,  July 28, 2009 8:33 AM  Follow-up for Phone Call        called pt

## 2010-06-17 NOTE — Progress Notes (Signed)
Summary: coumadin clinic   Phone Note Call from Patient Call back at Home Phone [phone]   Caller: Patient Reason for Call: Talk to Nurse Summary of Call: request to speak to someone in the coumadin clinic, please call before 3:15 Initial call taken by: Migdalia Dk,  Sep 18, 2009 12:43 PM  Follow-up for Phone Call        Spoke with pt's husband.  She was in the hospital this week for hypoglycemia.  Her INR today was 1.59.  They instructed her to take 5mg  x 2 days then resume same dose.  Told pt this was okay and they have an appt on 5/11.  Follow-up by: Weston Brass PharmD,  Sep 18, 2009 1:30 PM

## 2010-06-17 NOTE — Progress Notes (Signed)
Summary: new rx glimeride needs ov  Phone Note Call from Patient Call back at [phone]   Caller: Spouse Call For: Judithann Sheen MD Summary of Call: pt needs new rx for glimepiride 4 mg take half twice a day call into [facility] [phone] Initial call taken by: Heron Sabins,  September 01, 2009 10:11 AM  Follow-up for Phone Call         ok p[er drstafford but needs office visit.  Follow-up by: Pura Spice, RN,  September 01, 2009 5:09 PM    New/Updated Medications: GLIMEPIRIDE 4 MG TABS (GLIMEPIRIDE) take 1/2 tab by mouth two times a day needs to be seen Prescriptions: GLIMEPIRIDE 4 MG TABS (GLIMEPIRIDE) take 1/2 tab by mouth two times a day needs to be seen  #30 x 0   Entered by:   Pura Spice, RN   Authorized by:   Judithann Sheen MD   Signed by:   Pura Spice, RN on 09/01/2009   Method used:   Electronically to        [facility] (retail)       [address]       [place], [place]  [address]       Ph: [phone]       Fax: [phone]   RxID:   [id number]

## 2010-06-17 NOTE — Progress Notes (Signed)
Summary: fasting?  Phone Note Call from Patient   Caller: Patient Call For: Judithann Sheen MD Summary of Call: Pt has appt Thursday, and is planning to get labs but wants to be sure Dr. Scotty Court wants her to fast.  Have explained to her if she wants Lipids etc, she needs to fast.  She insists we ask Dr. Scotty Court????? [phone] Initial call taken by: Lynann Beaver CMA,  Sep 29, 2009 9:41 AM  Follow-up for Phone Call        thanks you are absolutely correct we need fasting labs  Follow-up by: Pura Spice, RN,  Sep 29, 2009 9:44 AM  Additional Follow-up for Phone Call Additional follow up Details #1::        Pt notified. Additional Follow-up by: Lynann Beaver CMA,  Sep 29, 2009 9:51 AM

## 2010-06-17 NOTE — Progress Notes (Signed)
Summary: refill steroids  Phone Note Call from Patient   Caller: Patient Call For: Judithann Sheen MD Summary of Call: Pt will run out of steroids this weekend, and [facility]  Initial call taken by: Lynann Beaver CMA,  July 15, 2009 11:09 AM  Follow-up for Phone Call        will call in AM, left message

## 2010-06-17 NOTE — Progress Notes (Signed)
Summary: colonoscopy referral  Phone Note Call from Patient Call back at Home Phone [phone]   Summary of Call: Colonoscopy referral.  Who was the doctor you said? Initial call taken by: Rudy Jew, RN,  Oct 07, 2009 4:43 PM  Follow-up for Phone Call        to schedule with Dr. Marina Goodell

## 2010-06-17 NOTE — Progress Notes (Signed)
Summary: refill meds   Phone Note Refill Request Call back at Home Phone [phone] Message from:  Patient on December 14, 2009 8:59 AM  Refills Requested: Medication #1:  SIMVASTATIN 80 MG TABS 1 hs [facility] on [address] . [phone]   Method Requested: Fax to Local Pharmacy Initial call taken by: Lorne Skeens,  December 14, 2009 9:00 AM  Follow-up for Phone Call        let pt know it was filled on the 29th Follow-up by: Hardin Negus, RMA,  December 16, 2009 2:02 PM

## 2010-06-17 NOTE — Assessment & Plan Note (Signed)
Summary: consult re: lump in breast/cjr/pt rsc/cjr/pt rsc/cjr/pt rsc/cjr   Vital Signs:  Patient profile:   77 year old female Height:      61 inches (154.94 cm) Weight:      150.31 pounds (68.32 kg) O2 Sat:      93 % on Room air Temp:     97.7 degrees F (36.50 degrees C) oral Pulse rate:   82 / minute BP sitting:   122 / 82  (left arm) Cuff size:   regular  Vitals Entered By: Josph Macho RMA (March 17, 2010 11:16 AM)  O2 Flow:  Room air CC: Lump in left breast- realized it X10 days/ CF Is Patient Diabetic? Yes   History of Present Illness: Patient is a 77 yo in today for reevaluation of breast lesions. She follows with [facility]/Dr Yolanda Bonine for some breast lesions and calcifications having MGMs every 6 months. She noted increased tenderness and a lump in her left breast in the past week. Denies any discharge from breast, skin changes or anorexia. No CP/palp/SOB/f/c/gi or gu c/o.  Current Medications (verified): 1)  Verapamil Hcl Cr 180 Mg Cr-Tabs (Verapamil Hcl) .... Take 1 Tablet By Mouth Two Times A Day 2)  Temazepam 30 Mg  Caps (Temazepam) .Marland Kitchen.. 1 Capsule At Bedtime 3)  Glimepiride 4 Mg Tabs (Glimepiride) .... Take 1/2 Tab By Mouth Two Times A Day 4)  Paxil 40 Mg  Tabs (Paroxetine Hcl) .Marland Kitchen.. 1 By Mouth Once Daily 5)  Warfarin Sodium 5 Mg Tabs (Warfarin Sodium) .... Take As Directed By Coumadin Clinic. 6)  Metoprolol Tartrate 25 Mg Tabs (Metoprolol Tartrate) .... Take 1/2 Tablet Two Times A Day 7)  Diazepam 5 Mg Tabs (Diazepam) .Marland Kitchen.. 1 By Mouth Four Times A Day As Needed Stress 8)  Hydrocodone-Acetaminophen 10-650 Mg Tabs (Hydrocodone-Acetaminophen) .Marland Kitchen.. 1 By Mouth Every 4-6 Hrs As Needed Pain. Not To Exceed 4 Per Day. 9)  One Touch Ultra Strips .... Test in Am 10)  Metformin Hcl 500 Mg Tabs (Metformin Hcl) .Marland Kitchen.. 1 Bid 11)  Duragesic-50 50 Mcg/hr Pt72 (Fentanyl) .Marland Kitchen.. 1 Patch Every 3 Days For Pain 12)  Altace 5 Mg Caps (Ramipril) .... Take 1 Capsule By Mouth Once A Day 13)   Vitamin D 1000 Unit Tabs (Cholecalciferol) .... Take 1 Tablet By Mouth Once A Day 14)  Align  Caps (Probiotic Product) .... One Capsule By Mouth Once Daily X 1 Month 15)  Metamucil Multihealth Fiber 58.6 % Powd (Psyllium) .... Once Daily 16)  Levsin/sl 0.125 Mg Subl (Hyoscyamine Sulfate) .Marland Kitchen.. 1-2 Sl Qid As Needed For Abdominal Cramping and Diarrhea 17)  Pravastatin Sodium 80 Mg Tabs (Pravastatin Sodium) .... Once Daily  Allergies (verified): 1)  Codeine  Past History:  Past medical history reviewed for relevance to current acute and chronic problems. Social history (including risk factors) reviewed for relevance to current acute and chronic problems.  Past Medical History: Reviewed history from 03/12/2010 and no changes required. Arthritis HTN Hyperlipideminaia Diabetes mellitus, type I Paroxysmal atrial fibrillation Fatigue Incomplete left bundle-branch block with left axis deviation Chronic back pain-narcotic dependence Lumbar disc disease Mitral valve prolapse Macular degeneration Esophageal Stricture Gastritis, RUT negative, 2005 Diverticulosis Hyperplastic colon polyps  03/2004 Hemorrhoids Alcohol use-dependence  Social History: Reviewed history from 12/14/2009 and no changes required. Retired --[facility] Married, 1 girl Tobacco Use - Yes.  1/2 ppd.  started at age 22.   Alcohol Use - yes-Daily Daily Caffeine Use 2 cups of 1/2 caf Illicit Drug Use -  no  Review of Systems      See HPI       Flu Vaccine Consent Questions     Do you have a history of severe allergic reactions to this vaccine? no    Any prior history of allergic reactions to egg and/or gelatin? no    Do you have a sensitivity to the preservative Thimersol? no    Do you have a past history of Guillan-Barre Syndrome? no    Do you currently have an acute febrile illness? no    Have you ever had a severe reaction to latex? no    Vaccine information given and explained to  patient? yes    Are you currently pregnant? no    Lot Number:[id number]   Exp Date:11/13/2010   Site Given  Left Deltoid IM Josph Macho RMA  March 17, 2010 11:32 AM   Physical Exam  General:  Well-developed,well-nourished,in no acute distress; alert,appropriate and cooperative throughout examination Head:  Normocephalic and atraumatic without obvious abnormalities. No apparent alopecia or balding. Mouth:  Oral mucosa and oropharynx without lesions or exudates.  Teeth in good repair. Neck:  No deformities, masses, or tenderness noted. Breasts:  tender at 2 oclock in right breast, lesion, firm linear lesion at 9 oclock in the left breast, tender and a 1 cm, firm, mobile circular lesion at 1 oclock in left breast Lungs:  Normal respiratory effort, chest expands symmetrically. Lungs are clear to auscultation, no crackles or wheezes. Heart:  Normal rate and irregular  rhythm. S1 and S2 normal without gallop, murmur, click, rub or other extra sounds irregular rhythm.   Abdomen:  Bowel sounds positive,abdomen soft and non-tender without masses, organomegaly or hernias noted. Extremities:  No clubbing, cyanosis, edema, or deformity noted with normal full range of motion of all joints.   Psych:  Cognition and judgment appear intact. Alert and cooperative with normal attention span and concentration. No apparent delusions, illusions, hallucinations   Impression & Recommendations:  Problem # 1:  BREAST MASS, LEFT (ICD-611.72)  Orders: Radiology Referral (Radiology) Sees [facility] and Dr Yolanda Bonine already will rerefer regarding the new lesions.   Problem # 2:  DIABETES MELLITUS, TYPE II, UNCONTROLLED (ICD-250.02)  Her updated medication list for this problem includes:    Glimepiride 4 Mg Tabs (Glimepiride) .Marland Kitchen... Take 1/2 tab by mouth two times a day    Metformin Hcl 500 Mg Tabs (Metformin hcl) .Marland Kitchen... 1 bid    Altace 5 Mg Caps (Ramipril) .Marland Kitchen... Take 1 capsule by mouth once a day No recent bump  in sugars  Problem # 3:  ATRIAL FIBRILLATION (ICD-427.31)  Her updated medication list for this problem includes:    Verapamil Hcl Cr 180 Mg Cr-tabs (Verapamil hcl) .Marland Kitchen... Take 1 tablet by mouth two times a day    Warfarin Sodium 5 Mg Tabs (Warfarin sodium) .Marland Kitchen... Take as directed by coumadin clinic.    Metoprolol Tartrate 25 Mg Tabs (Metoprolol tartrate) .Marland Kitchen... Take 1/2 tablet two times a day Rate controlled  Complete Medication List: 1)  Verapamil Hcl Cr 180 Mg Cr-tabs (Verapamil hcl) .... Take 1 tablet by mouth two times a day 2)  Temazepam 30 Mg Caps (Temazepam) .Marland Kitchen.. 1 capsule at bedtime 3)  Glimepiride 4 Mg Tabs (Glimepiride) .... Take 1/2 tab by mouth two times a day 4)  Paxil 40 Mg Tabs (Paroxetine hcl) .Marland Kitchen.. 1 by mouth once daily 5)  Warfarin Sodium 5 Mg Tabs (Warfarin sodium) .... Take as directed by coumadin clinic. 6)  Metoprolol Tartrate 25 Mg Tabs (Metoprolol tartrate) .... Take 1/2 tablet two times a day 7)  Diazepam 5 Mg Tabs (Diazepam) .Marland Kitchen.. 1 by mouth four times a day as needed stress 8)  Hydrocodone-acetaminophen 10-650 Mg Tabs (Hydrocodone-acetaminophen) .Marland Kitchen.. 1 by mouth every 4-6 hrs as needed pain. not to exceed 4 per day. 9)  One Touch Ultra Strips  .... Test in am 10)  Metformin Hcl 500 Mg Tabs (Metformin hcl) .Marland Kitchen.. 1 bid 11)  Duragesic-50 50 Mcg/hr Pt72 (Fentanyl) .Marland Kitchen.. 1 patch every 3 days for pain 12)  Altace 5 Mg Caps (Ramipril) .... Take 1 capsule by mouth once a day 13)  Vitamin D 1000 Unit Tabs (Cholecalciferol) .... Take 1 tablet by mouth once a day 14)  Align Caps (Probiotic product) .... One capsule by mouth once daily x 1 month 15)  Metamucil Multihealth Fiber 58.6 % Powd (Psyllium) .... Once daily 16)  Levsin/sl 0.125 Mg Subl (Hyoscyamine sulfate) .Marland Kitchen.. 1-2 sl qid as needed for abdominal cramping and diarrhea 17)  Pravastatin Sodium 80 Mg Tabs (Pravastatin sodium) .... Once daily  Other Orders: Flu Vaccine 27yrs + MEDICARE PATIENTS (M8413) Administration Flu  vaccine - MCR (K4401)  Patient Instructions: 1)  Please schedule a follow-up appointment in 2 to 3  months or as needed, call if any concerns Prescriptions: DURAGESIC-50 50 MCG/HR PT72 (FENTANYL) 1 patch every 3 days for pain  #10 x 0   Entered and Authorized by:   Danise Edge MD   Signed by:   Danise Edge MD on 03/17/2010   Method used:   Print then Give to Patient   RxID:   [id number]    Orders Added: 1)  Flu Vaccine 8yrs + MEDICARE PATIENTS [Q2039] 2)  Administration Flu vaccine - MCR [G0008] 3)  Radiology Referral [Radiology] 4)  Est. Patient Level IV [59563]

## 2010-06-17 NOTE — Medication Information (Signed)
Summary: Coumadin Clinic  Anticoagulant Therapy  Managed by: Cloyde Reams, RN, BSN Referring MD: Olga Millers MD Supervising MD: Johney Frame MD, Fayrene Fearing Indication 1: Atrial Fibrillation (ICD-427.31) Indication 2: Atrial Flutter (ICD-427.32) Lab Used: [facility] Site: [facility] PT 30.6 INR POC 3.0 INR RANGE 2 - 3    Bleeding/hemorrhagic complications: no     Any changes in medication regimen? yes       Details: Continue on prednisone 12 day taper started on 07/25/09 upon hospital discharge.     Any missed doses?: no        Comments: Pt was d/c from hospital on 07/25/09 INR 1.82.  Pt was d/c on 2.5mg  daily, but since home resumed taking previous dosage prior to hospital which is 2.5mg  daily except 5mg  on Su,Th.  Allergies: No Known Drug Allergies  Anticoagulation Management History:      Her anticoagulation is being managed by telephone today.  Positive risk factors for bleeding include an age of 75 years or older and presence of serious comorbidities.  The bleeding index is 'intermediate risk'.  Positive CHADS2 values include History of Diabetes.  Negative CHADS2 values include Age > 32 years old.  The start date was 07/16/2007.  Prothrombin time is 30.6.  Anticoagulation responsible provider: Tashena Ibach MD, Fayrene Fearing.  INR POC: 3.0.  Exp: 08/2010.    Anticoagulation Management Assessment/Plan:      The patient's current anticoagulation dose is Warfarin sodium 5 mg tabs: Take as directed by coumadin clinic..  The target INR is 2.0-3.0.  The next INR is due 08/07/2009.  Anticoagulation instructions were given to patient.  Results were reviewed/authorized by Cloyde Reams, RN, BSN.  She was notified by Cloyde Reams RN.         Prior Anticoagulation Instructions: INR 2.3 Continue 2.5mg s daily except 5mg s on Sundays and Thursdays. Recheck in 4 weeks.   Current Anticoagulation Instructions: INR 3.0  Called spoke with pt.  Advised while on prednisone to decr coumadin to 2.5mg  daily, given  recent INR 7 upon hosp admission d/t 3 Medrol dose paks.  Recommended pt return for f/u in 1 week.  Pt requests to return after Prednisone completed.  Pt aware of risks assoc with prednisone and coumadin and the possibility of medication interaction.  Pt advised to monitor for bleeding problems.  Made f/u appt in clinic for 08/07/09.

## 2010-06-17 NOTE — Assessment & Plan Note (Signed)
Summary: DARK STOOL...AS.   History of Present Illness Visit Type: consult Primary GI MD: Elie Goody MD [facility] Primary Provider: Dianna Limbo, MD Requesting Provider: Dianna Limbo, MD Chief Complaint: alternating constipation and diarrhea History of Present Illness:   This is a 77 year old female previously evaluated by Dr. Victorino Dike, who relates a change in bowel habits over the past several months with alternating diarrhea and constipation. She is using Lomotil and Ducolax intermittently to help manage her symptoms, but her symptoms have been steadily worsening. She notes occasionally dark brown to black colored stool when she is very constipated. She denies tarry, sticky stools or red rectal bleeding.  She had a colonoscopy by Dr. Corinda Gubler in November 2005 that showed hyperplastic colon polyps, diverticulosis, and external hemorrhoids.   GI Review of Systems    Reports bloating and  heartburn.      Denies abdominal pain, acid reflux, belching, chest pain, dysphagia with liquids, dysphagia with solids, loss of appetite, nausea, vomiting, vomiting blood, weight loss, and  weight gain.      Reports constipation and  diarrhea.     Denies anal fissure, black tarry stools, change in bowel habit, diverticulosis, fecal incontinence, heme positive stool, hemorrhoids, irritable bowel syndrome, jaundice, light color stool, liver problems, rectal bleeding, and  rectal pain. Preventive Screening-Counseling & Management      Drug Use:  no.     Current Medications (verified): 1)  Verapamil Hcl Cr 180 Mg Cr-Tabs (Verapamil Hcl) .... Take 1 Tablet By Mouth Two Times A Day 2)  Temazepam 30 Mg  Caps (Temazepam) .Marland Kitchen.. 1 Capsule At Bedtime 3)  Glimepiride 4 Mg Tabs (Glimepiride) .... Take 1/2 Tab By Mouth Two Times A Day 4)  Paxil 40 Mg  Tabs (Paroxetine Hcl) .Marland Kitchen.. 1 By Mouth Once Daily 5)  Simvastatin 80 Mg Tabs (Simvastatin) .Marland Kitchen.. 1 Hs 6)  Warfarin Sodium 5 Mg Tabs (Warfarin Sodium) ....  Take As Directed By Coumadin Clinic. 7)  Metoprolol Tartrate 25 Mg Tabs (Metoprolol Tartrate) .... Take 1/2 Tablet Two Times A Day 8)  Diazepam 5 Mg Tabs (Diazepam) .Marland Kitchen.. 1 By Mouth Four Times A Day As Needed Stress 9)  Hydrocodone-Acetaminophen 10-650 Mg Tabs (Hydrocodone-Acetaminophen) .Marland Kitchen.. 1 By Mouth Every 4-6 Hrs As Needed Pain. Not To Exceed 4 Per Day. 10)  One Touch Ultra Strips .... Test in Am 11)  Metformin Hcl 500 Mg Tabs (Metformin Hcl) .Marland Kitchen.. 1 Bid 12)  Duragesic-50 50 Mcg/hr Pt72 (Fentanyl) .Marland Kitchen.. 1 Patch Every 3 Days For Pain Fill On July 19 13)  Altace 5 Mg Caps (Ramipril) .... Take 1 Capsule By Mouth Once A Day 14)  Vitamin D 1000 Unit Tabs (Cholecalciferol) .... Take 1 Tablet By Mouth Once A Day  Allergies (verified): 1)  Codeine  Past History:  Past Medical History: Arthritis HTN Hyperlipideminaia Diabetes mellitus, type I Paroxysmal atrial fibrillation Fatigue Incomplete left bundle-branch block with left axis deviation Chronic back pain Lumbar disc disease Mitral valve prolapse Macular degeneration Esophageal Stricture Gastritis, RUT negative, 2005 Diverticulosis Hyperplastic colon polyps  03/2004 Hemorrhoids  Past Surgical History: Right ankle surgery Knee Arthroscopy--right  Family History: Reviewed history from 07/29/2009 and no changes required. Mother died at age 58, heart disease  father died at age 10, throat cancer.   She does have a sister that is alive and also has atrial  fibrillation.   Social History: Retired --[facility] Married, 1 girl Tobacco Use - Yes.  1/2 ppd.  started at age 49.  Alcohol Use - yes-Daily Daily Caffeine Use 2 cups of 1/2 caf Illicit Drug Use - no Drug Use:  no  Review of Systems       The patient complains of arthritis/joint pain, back pain, heart rhythm changes, and shortness of breath.         The pertinent positives and negatives are noted as above and in the HPI. All other ROS were  reviewed and were negative.   Vital Signs:  Patient profile:   77 year old female Height:      61 inches Weight:      145 pounds BMI:     27.50 Pulse rate:   64 / minute Pulse rhythm:   regular BP sitting:   112 / 62  (left arm) Cuff size:   regular  Vitals Entered By: Francee Piccolo CMA Duncan Dull) (December 14, 2009 11:09 AM)  Physical Exam  General:  Well developed, well nourished, no acute distress. In a wheelchair, with uses a cane and sometimes a walker. Head:  Normocephalic and atraumatic. Eyes:  PERRLA, no icterus. Ears:  Normal auditory acuity. Mouth:  No deformity or lesions, dentition normal. Neck:  Supple; no masses or thyromegaly. Lungs:  Clear throughout to auscultation. Heart:  Regular rate and rhythm; no murmurs, rubs,  or bruits. Abdomen:  Soft, nontender and nondistended. No masses, hepatosplenomegaly or hernias noted. Normal bowel sounds. Rectal:  deferred until time of colonoscopy.  deferred until time of colonoscopy.   Pulses:  Normal pulses noted. Extremities:  No clubbing, cyanosis, edema or deformities noted. Neurologic:  Alert and  oriented x4;  grossly normal neurologically. Cervical Nodes:  No significant cervical adenopathy. Inguinal Nodes:  No significant inguinal adenopathy. Psych:  Alert and cooperative. Normal mood and affect.  Impression & Recommendations:  Problem # 1:  CHANGE IN BOWELS (ICD-787.99)  Change in bowel habits with worsening diarrhea and constipation and occasional dark stools. I have advised her to discontinue the use of Lomotil and Ducolax except for extreme circumstances. Begin a high fiber diet with daily fiber supplements and increased daily water intake to 6 or 8 glasses daily. Align for one month. Stool Hemoccults. Rule out colorectal lesions. The risks, benefits and alternatives to colonoscopy with possible biopsy and possible polypectomy were discussed with the patient and they consent to proceed. The procedure will be  scheduled electively.  Orders: Colonoscopy (Colon) [facility] Hemoccult Cards #3 (take home) (Hem cards #3)  Problem # 2:  COUMADIN THERAPY (ICD-V58.61)  The risks, benefits, and alternatives to 5 day hold Coumadin to allow for biopsy or polypectomy at the time of colonoscopy was discussed with the patient. She consents to proceed. She has had stable atrial fibrillation and we will obtain clearance from Dr. Graciela Husbands.  Orders: Colonoscopy (Colon)  Patient Instructions: 1)  Start Align one tablet by mouth once daily x 1 month. 2)  Start Metamucil once daily. 3)  High Fiber, Low Fat  Healthy Eating Plan brochure given.  4)  Colonoscopy and Flexible Sigmoidoscopy brochure given.  5)  Follow instructions on Hemoccult cards and mail back to [place] when finished.  6)  Copy sent to : Dianna Limbo, MD 7)  The medication list was reviewed and reconciled.  All changed / newly prescribed medications were explained.  A complete medication list was provided to the patient / caregiver.  Prescriptions: MOVIPREP 100 GM  SOLR (PEG-KCL-NACL-NASULF-NA ASC-C) As per prep instructions.  #1 x 0   Entered by:   Christie Nottingham CMA (  AAMA)   Authorized by:   Meryl Dare MD [facility]   Signed by:   Meryl Dare MD FACG on 12/14/2009   Method used:   Electronically to        [facility] [address] [phone]* (retail)       [address]       [place], [place]  [address]       Ph: [phone] or [phone]       Fax: [phone]   RxID:   [id number]   Appended Document: DARK STOOL...AS. reviewedOV  Appended Document: DARK STOOL...AS.    Clinical Lists Changes  Medications: Added new medication of MOVIPREP 100 GM  SOLR (PEG-KCL-NACL-NASULF-NA ASC-C) As per prep instructions. - Signed Rx of MOVIPREP 100 GM  SOLR (PEG-KCL-NACL-NASULF-NA ASC-C) As per prep instructions.;  #1 x 0;  Signed;  Entered by: Christie Nottingham CMA (AAMA);  Authorized by: Meryl Dare MD [facility];  Method used: Electronically  [facility]  [address] [phone]*, [address] [place], [place]  [address], Ph: [phone] or [phone], Fax: [phone]    Prescriptions: MOVIPREP 100 GM  SOLR (PEG-KCL-NACL-NASULF-NA ASC-C) As per prep instructions.  #1 x 0   Entered by:   Christie Nottingham CMA (AAMA)   Authorized by:   Meryl Dare MD [facility]   Signed by:   Christie Nottingham CMA (AAMA) on 02/12/2010   Method used:   [facility]        [facility]  [address] [phone]* (retail)       [address]       [place], [place]  [address]       Ph: [phone] or [phone]       Fax: [phone]   RxID:   [id number]

## 2010-06-17 NOTE — Procedures (Signed)
Summary: EGD Victorino Dike)   EGD  Procedure date:  03/30/2004  Findings:      Findings: Gastritis  Findings: Stricture:  Location: [facility]   Patient Name: [name], [name]. MRN:  Procedure Procedures: Panendoscopy (EGD) CPT: 43235.  Personnel: Endoscopist: Ulyess Mort, MD.  Referred By: Dianna Limbo, MD.  Exam Location: Exam performed in Outpatient Clinic. Outpatient  Patient Consent: Procedure, Alternatives, Risks and Benefits discussed, consent obtained, from patient. Consent was obtained by the RN.  Indications Symptoms: Dyspepsia, Abdominal pain,  Surveillance of: Prior gastric ulcer.  Exam Exam Info: Maximum depth of insertion Duodenum, intended Duodenum. Vocal cords visualized. Gastric retroflexion performed. Images were not taken. ASA Classification: II. Tolerance: good.  Sedation Meds: Patient assessed and found to be appropriate for moderate (conscious) sedation. Fentanyl 50 mcg. given IV. Versed 5 mg. given IV. Cetacaine Spray 2 sprays given aerosolized.  Monitoring: BP and pulse monitoring done. Oximetry used. Supplemental O2 given  Findings - HIATAL HERNIA: 2 cms. in length. stricture. ICD9: Esophageal Stricture: 530.3. - Dilation: Distal Esophagus. Maloney dilator used, Diameter: 52 mm, Minimal Resistance, No Heme present on extraction. Patient tolerance good.  - MUCOSAL ABNORMALITY: Body to Antrum. Friable mucosa. Granular mucosa. RUT done, results pending. ICD9: Gastritis, Chronic: 535.10. Comment: mild hemorrhagic gastritis.  - OTHER FINDING: deformity in Pyloric Sphincter.  - MUCOSAL ABNORMALITY: Duodenal Bulb to Jejunum. Granular mucosa.   Assessment Abnormal examination, see findings above.  Diagnoses: 530.3: Esophageal Stricture.  535.10: Gastritis, Chronic.   Events  Unplanned Intervention: No unplanned interventions were required.  Unplanned Events: There were no complications. Plans Medication(s): Await  pathology. Continue current medications.  Patient Education: Patient given standard instructions for: Hiatal Hernia. Reflux. Stenosis / Stricture. Mucosal Abnormality. needs ultra sound of gall bladder.  Disposition: After procedure patient sent to recovery. After recovery patient sent home.  This report was created from the original endoscopy report, which was reviewed and signed by the above listed endoscopist.    cc. London Sheer

## 2010-06-17 NOTE — Progress Notes (Signed)
Summary: Triage  Medications Added LEVSIN/SL 0.125 MG SUBL (HYOSCYAMINE SULFATE) 1-2 SL qid as needed for abdominal cramping and diarrhea       Phone Note Call from Patient Call back at Home Phone [phone]   Caller: Patient Call For: Dr. Russella Dar Reason for Call: Talk to Nurse Summary of Call: immediately after eating she gets abd pain, diarrhea, nausea Initial call taken by: Karna Christmas,  February 19, 2010 9:52 AM  Follow-up for Phone Call        Patient  is c/o urgency and diarrhea .  Mainly after meals.  Having difficulty getting to the bathroom on time.  Dr Russella Dar can we call her in an antispasmodic?  She is scheduled for a colon on 03/03/10 Follow-up by: Darcey Nora RN, CGRN,  February 19, 2010 11:01 AM  Additional Follow-up for Phone Call Additional follow up Details #1::        Trial of Levsin SL 1-2 qid as needed, #80, 5 refills Additional Follow-up by: Meryl Dare MD Clementeen Graham,  February 19, 2010 1:06 PM    Additional Follow-up for Phone Call Additional follow up Details #2::    Patient  aware of Dr Ardell Isaacs recommendations.  New orders sent to rx. Follow-up by: Darcey Nora RN, CGRN,  February 19, 2010 1:14 PM  New/Updated Medications: LEVSIN/SL 0.125 MG SUBL (HYOSCYAMINE SULFATE) 1-2 SL qid as needed for abdominal cramping and diarrhea Prescriptions: LEVSIN/SL 0.125 MG SUBL (HYOSCYAMINE SULFATE) 1-2 SL qid as needed for abdominal cramping and diarrhea  #80 x 5   Entered by:   Darcey Nora RN, CGRN   Authorized by:   Meryl Dare MD [facility]   Signed by:   Darcey Nora RN, CGRN on 02/19/2010   Method used:   Electronically to        [facility] (retail)       [address]       [place], [place]  [address]       Ph: [phone]       Fax: [phone]   RxID:   [id number]

## 2010-06-17 NOTE — Progress Notes (Signed)
  Phone Note Refill Request Message from:  Fax from Pharmacy on March 09, 2010 11:20 AM  Needs 90 days for insurance to pay instead of 30 day supply  Initial call taken by: Josph Macho RMA,  March 09, 2010 11:20 AM    Prescriptions: PAXIL 40 MG  TABS (PAROXETINE HCL) 1 by mouth once daily  #90 x 0   Entered by:   Josph Macho RMA   Authorized by:   Danise Edge MD   Signed by:   Josph Macho RMA on 03/09/2010   Method used:   Telephoned to ...       [facility]  [address] [phone]* (retail)       [address]       [place], [place]  [address]       Ph: [phone] or [phone]       Fax: [phone]   RxID:   [id number]

## 2010-06-17 NOTE — Medication Information (Signed)
Summary: rov/cb  Anticoagulant Therapy  Managed by: Bethena Midget, RN, BSN Referring MD: Olga Millers MD PCP: Judithann Sheen MD Supervising MD: Juanda Chance MD, Yoko Mcgahee Indication 1: Atrial Fibrillation (ICD-427.31) Indication 2: Atrial Flutter (ICD-427.32) Lab Used: [facility] Site: [facility] INR POC 1.7 INR RANGE 2 - 3  Dietary changes: no    Health status changes: no    Bleeding/hemorrhagic complications: no    Recent/future hospitalizations: no    Any changes in medication regimen? no    Recent/future dental: no  Any missed doses?: no       Is patient compliant with meds? yes       Allergies: No Known Drug Allergies  Anticoagulation Management History:      The patient is taking warfarin and comes in today for a routine follow up visit.  Positive risk factors for bleeding include an age of 31 years or older and presence of serious comorbidities.  The bleeding index is 'intermediate risk'.  Positive CHADS2 values include History of Diabetes.  Negative CHADS2 values include Age > 51 years old.  The start date was 07/16/2007.  Her last INR was 3.0 ratio.  Anticoagulation responsible provider: Juanda Chance MD, Smitty Cords.  INR POC: 1.7.  Cuvette Lot#: [id number].  Exp: 12/2010.    Anticoagulation Management Assessment/Plan:      The patient's current anticoagulation dose is Warfarin sodium 5 mg tabs: Take as directed by coumadin clinic..  The target INR is 2.0-3.0.  The next INR is due 11/10/2009.  Anticoagulation instructions were given to patient.  Results were reviewed/authorized by Bethena Midget, RN, BSN.  She was notified by Bethena Midget, RN, BSN.         Prior Anticoagulation Instructions: INR 3.3. Hold Coumadin today, then take 0.5 tablet daily except 1 tablet on Sundays. Recheck in 2 weeks.  Current Anticoagulation Instructions: INR 1.7 Today take 5mg s then change dose to 2.5mg s everyday except 5mg s on Mondays and Fridays. Recheck in 2 weeks.

## 2010-06-17 NOTE — Medication Information (Signed)
Summary: rov/kh   Anticoagulant Therapy  Managed by: Tammy Sours PharmD Referring MD: Olga Millers MD PCP: Dianna Limbo, MD Supervising MD: Shirlee Latch MD, Dalton Indication 1: Atrial Fibrillation (ICD-427.31) Indication 2: Atrial Flutter (ICD-427.32) Lab Used: [facility] Site: [facility] INR POC 2.6 INR RANGE 2 - 3  Dietary changes: no    Health status changes: no    Bleeding/hemorrhagic complications: no    Recent/future hospitalizations: no    Any changes in medication regimen? no    Recent/future dental: no  Any missed doses?: no       Is patient compliant with meds? yes       Allergies: 1)  Codeine  Anticoagulation Management History:      The patient is taking warfarin and comes in today for a routine follow up visit.  Positive risk factors for bleeding include an age of 75 years or older and presence of serious comorbidities.  The bleeding index is 'intermediate risk'.  Positive CHADS2 values include History of Diabetes.  Negative CHADS2 values include Age > 31 years old.  The start date was 07/16/2007.  Her last INR was 3.0 ratio.  Anticoagulation responsible Kemper Hochman: Shirlee Latch MD, Dalton.  INR POC: 2.6.  Cuvette Lot#: [id number].  Exp: 03/2011.    Anticoagulation Management Assessment/Plan:      The patient's current anticoagulation dose is Warfarin sodium 5 mg tabs: Take as directed by coumadin clinic..  The target INR is 2.0-3.0.  The next INR is due 04/22/2010.  Anticoagulation instructions were given to patient.  Results were reviewed/authorized by Tammy Sours PharmD.         Prior Anticoagulation Instructions: INR 1.9 Take 1 tablet today then resume previous dose of 0.5 tablet everyday except 1 tablet on Monday and Friday Recheck INR in 3 weeks   Current Anticoagulation Instructions: INR 2.6  Continue current regimen. Take 0.5 tablets everday except 1 tablet on Mondays and Fridays.  Recheck INR in 4 weeks.

## 2010-06-17 NOTE — Progress Notes (Signed)
Summary: pls call  Phone Note Call from Patient   Caller: Spouse, Joe Call For: Judithann Sheen MD Summary of Call: wants doctor to call, he thinks wife needs to go to the hospital, not getting better. Initial call taken by: Raechel Ache, RN,  July 16, 2009 12:06 PM  Follow-up for Phone Call        ok take her to [place] or [place]  Follow-up by: Pura Spice, RN,  July 16, 2009 12:39 PM  Additional Follow-up for Phone Call Additional follow up Details #1::        Pt notified. Additional Follow-up by: Lynann Beaver CMA,  July 16, 2009 12:52 PM

## 2010-06-17 NOTE — Medication Information (Signed)
Summary: rov/sp  Anticoagulant Therapy  Managed by: Bethena Midget, RN, BSN Referring MD: Olga Millers MD PCP: Judithann Sheen MD Supervising MD: Excell Seltzer MD, Casimiro Needle Indication 1: Atrial Fibrillation (ICD-427.31) Indication 2: Atrial Flutter (ICD-427.32) Lab Used: [facility] Site: [facility] INR POC 3.6 INR RANGE 2 - 3  Dietary changes: no    Health status changes: no    Bleeding/hemorrhagic complications: no    Recent/future hospitalizations: no    Any changes in medication regimen? no    Recent/future dental: no  Any missed doses?: no       Is patient compliant with meds? yes       Allergies: No Known Drug Allergies  Anticoagulation Management History:      The patient is taking warfarin and comes in today for a routine follow up visit.  Positive risk factors for bleeding include an age of 75 years or older and presence of serious comorbidities.  The bleeding index is 'intermediate risk'.  Positive CHADS2 values include History of Diabetes.  Negative CHADS2 values include Age > 70 years old.  The start date was 07/16/2007.  Her last INR was 3.0 ratio.  Anticoagulation responsible provider: Excell Seltzer MD, Casimiro Needle.  INR POC: 3.6.  Cuvette Lot#: [id number].  Exp: 10/2010.    Anticoagulation Management Assessment/Plan:      The patient's current anticoagulation dose is Warfarin sodium 5 mg tabs: Take as directed by coumadin clinic..  The target INR is 2.0-3.0.  The next INR is due 09/23/2009.  Anticoagulation instructions were given to patient.  Results were reviewed/authorized by Bethena Midget, RN, BSN.  She was notified by Bethena Midget, RN, BSN.         Prior Anticoagulation Instructions: INR 3.0  Called spoke with pt.  Advised while on prednisone to decr coumadin to 2.5mg  daily, given recent INR 7 upon hosp admission d/t 3 Medrol dose paks.  Recommended pt return for f/u in 1 week.  Pt requests to return after Prednisone completed.  Pt aware of risks assoc with prednisone  and coumadin and the possibility of medication interaction.  Pt advised to monitor for bleeding problems.  Made f/u appt in clinic for 08/07/09.  Current Anticoagulation Instructions: INR 3.6 Skip today's dose then change dose to 2.5mg s everyday except on Wednesday 1mg . Recheck in 2 weeks.

## 2010-06-17 NOTE — Progress Notes (Signed)
Summary: refill meds   Phone Note Refill Request Call back at Home Phone [phone] Message from:  Patient on July 06, 2009 10:18 AM  Refills Requested: Medication #1:  VERAPAMIL HCL CR 180 MG CR-TABS Take 1 tablet by mouth two times a day [facility] [address]    Method Requested: Fax to Local Pharmacy Initial call taken by: Lorne Skeens,  July 06, 2009 10:18 AM  Follow-up for Phone Call        called pharmacy and Rx already filled.  Rx was sent in with 11 refills in December.   Follow-up by: Judithe Modest CMA,  July 06, 2009 12:56 PM

## 2010-06-17 NOTE — Progress Notes (Signed)
Summary: BS up and down  Phone Note Call from Patient   Caller: Spouse Call For: Judithann Sheen MD Summary of Call: Husband called to let Almira Coaster know that pt's BS was 63 , and had a bowl of cereal and banana.  He found her sitting her on the couch dazed. Checked BS again and it was 280. Heart racing. Off Prednisone x 5 days. [phone] Not eating correctly. Initial call taken by: Lynann Beaver CMA,  August 11, 2009 11:18 AM  Follow-up for Phone Call         per dr Scotty Court will  make appt with dr Lurene Shadow Follow-up by: Pura Spice, RN,  August 11, 2009 11:28 AM  Additional Follow-up for Phone Call Additional follow up Details #1::        Phone Call Completed Additional Follow-up by: Rudy Jew, RN,  August 11, 2009 11:35 AM

## 2010-06-17 NOTE — Medication Information (Signed)
Summary: rov/tm  Anticoagulant Therapy  Managed by: Weston Brass, PharmD Referring MD: Olga Millers MD PCP: Judithann Sheen MD Supervising MD: Gala Romney MD, Reuel Boom Indication 1: Atrial Fibrillation (ICD-427.31) Indication 2: Atrial Flutter (ICD-427.32) Lab Used: [facility] Site: [facility] INR POC 2.3 INR RANGE 2 - 3  Dietary changes: no    Health status changes: no    Bleeding/hemorrhagic complications: no    Recent/future hospitalizations: no    Any changes in medication regimen? no    Recent/future dental: no  Any missed doses?: no       Is patient compliant with meds? yes       Allergies: No Known Drug Allergies  Anticoagulation Management History:      The patient is taking warfarin and comes in today for a routine follow up visit.  Positive risk factors for bleeding include an age of 45 years or older and presence of serious comorbidities.  The bleeding index is 'intermediate risk'.  Positive CHADS2 values include History of Diabetes.  Negative CHADS2 values include Age > 25 years old.  The start date was 07/16/2007.  Her last INR was 3.0 ratio.  Anticoagulation responsible provider: Erick Oxendine MD, Reuel Boom.  INR POC: 2.3.  Cuvette Lot#: [id number].  Exp: 01/2011.    Anticoagulation Management Assessment/Plan:      The patient's current anticoagulation dose is Warfarin sodium 5 mg tabs: Take as directed by coumadin clinic..  The target INR is 2.0-3.0.  The next INR is due 12/08/2009.  Anticoagulation instructions were given to patient.  Results were reviewed/authorized by Weston Brass, PharmD.  She was notified by Weston Brass PharmD.         Prior Anticoagulation Instructions: INR 1.7 Today take 5mg s then change dose to 2.5mg s everyday except 5mg s on Mondays and Fridays. Recheck in 2 weeks.   Current Anticoagulation Instructions: INR 2.3  Continue same dose of 1/2 tablet every day except 1 tablet on Monday and Friday.

## 2010-06-17 NOTE — Medication Information (Signed)
  Anticoagulant Therapy  Managed by: Shelby Dubin, PharmD, BCPS, CPP Referring MD: Olga Millers MD Supervising MD: Juanda Chance MD, Derak Schurman Indication 1: Atrial Fibrillation (ICD-427.31) Indication 2: Atrial Flutter (ICD-427.32) Lab Used[facility]  Site: [facility] INR POC 2.1 INR RANGE 2 - 3  Dietary changes: no    Health status changes: no    Bleeding/hemorrhagic complications: no    Recent/future hospitalizations: no    Any changes in medication regimen? no    Recent/future dental: no  Any missed doses?: no       Is patient compliant with meds? yes       Allergies: No Known Drug Allergies  Anticoagulation Management History:      The patient is taking warfarin and comes in today for a routine follow up visit.  Positive risk factors for bleeding include an age of 75 years or older and presence of serious comorbidities.  The bleeding index is 'intermediate risk'.  Positive CHADS2 values include History of Diabetes.  Negative CHADS2 values include Age > 72 years old.  The start date was 07/16/2007.  Anticoagulation responsible provider: Juanda Chance MD, Smitty Cords.  INR POC: 2.1.  Cuvette Lot#: [id number].  Exp: 06/2010.    Anticoagulation Management Assessment/Plan:      The patient's current anticoagulation dose is Warfarin sodium 5 mg tabs: Take as directed by coumadin clinic..  The target INR is 2.0-3.0.  The next INR is due 06/16/2009.  Anticoagulation instructions were given to patient.  Results were reviewed/authorized by Shelby Dubin, PharmD, BCPS, CPP.  She was notified by Shelby Dubin PharmD, BCPS, CPP.         Prior Anticoagulation Instructions: INR 2.4 Continue 2.5mg s daily except 5mg s on Sundays and Thursdays. Recheck in 4 weeks.  Current Anticoagulation Instructions: INR 2.1  Continue 1 tab each Sunday and Thursday and 0.5 tab on all other days.    Recheck in 4 weeks.

## 2010-06-17 NOTE — Progress Notes (Signed)
Summary: speak to Dr. Scotty Court  Phone Note Call from Patient   Caller: Patient Call For: Judithann Sheen MD Summary of Call: Pt is feeling better and wants to talk to Dr. Scotty Court.  Needs to talk to him before 2pm. [phone] Initial call taken by: Lynann Beaver CMA,  July 02, 2009 9:12 AM  Follow-up for Phone Call        discussed problems patient much better     Appended Document: speak to Dr. Scotty Court discussed medications with patient

## 2010-06-17 NOTE — Miscellaneous (Signed)
Summary: Resend Movi prep  Clinical Lists Changes  Medications: Rx of MOVIPREP 100 GM  SOLR (PEG-KCL-NACL-NASULF-NA ASC-C) As per prep instructions.;  #1 x 0;  Signed;  Entered by: Christie Nottingham CMA (AAMA);  Authorized by: Meryl Dare MD [facility];  Method used: Electronically to [facility] [address] [place], [place], [place]  [address], Ph: [phone], Fax: [phone]    Prescriptions: MOVIPREP 100 GM  SOLR (PEG-KCL-NACL-NASULF-NA ASC-C) As per prep instructions.  #1 x 0   Entered by:   Christie Nottingham CMA (AAMA)   Authorized by:   Meryl Dare MD [facility]   Signed by:   Christie Nottingham CMA (AAMA) on 02/12/2010   Method used:   Electronically to        [facility] (retail)       [address]       [place], [place]  [address]       Ph: [phone]       Fax: [phone]   RxID:   [id number]

## 2010-06-17 NOTE — Progress Notes (Signed)
Summary: coumadin f/u s/p hospital d/c  Phone Note Call from Patient   Caller: Patient Call For: Coumadin Clinic Summary of Call: Spoke with pt.  Pt was discharged from hospital on 3/12 with INR of 1.82.  INR upon admission on 07/20/09 was 7.  Pt's dosage was decreased upon d/c to 2.5mg  daily of coumadin, but pt resumed same dosage 2.5mg  daily except 5mg  on Sundays and Thursdays.  Pt was also d/c on a 12 day Prednisone taper which she is on day 4.  Advised pt she needs to have INR checked this week.  Pt states she is unable to come into clinic d/t other MD appts scheduled for this week.  Pt is scheduled to see Dr Shelle Iron tomorrow in office.  Called spoke with Dr, Teddy Spike nurse Aundra Millet and requested PT/INR drawn at [facility] with results sent to coumadin clinic.  Pt aware INR to be done at [facility] with Dr Shelle Iron tomorrow.  Will await results and call pt with instructions.   Initial call taken by: Cloyde Reams RN,  July 28, 2009 4:45 PM

## 2010-06-17 NOTE — Progress Notes (Signed)
Summary: Pt found golf ball size lump in breast. Discloration  Phone Note Call from Patient Call back at Home Phone [phone] Call back at [phone] cell   Caller: Spouse - Joe Summary of Call: Pt was referred over to [facility] re: lumps in breast and under arm. Pt had mamogram in November. Nothing was foud abnormal. In the last  few days, pt has discovered and new lump in lft breast. Lump is size of golf ball and now breast is discoloration like bruising on breast. Pls advise.   Something needs to be done quickly.  Initial call taken by: Lucy Antigua,  May 31, 2010 10:10 AM  Follow-up for Phone Call        Scheduled with Dr. Rodena Medin today. Follow-up by: Lynann Beaver CMA AAMA,  May 31, 2010 10:28 AM

## 2010-06-17 NOTE — Progress Notes (Signed)
Summary: labs  Phone Note Call from Patient   Caller: Patient Call For: Judithann Sheen MD Summary of Call: Per request of pt.......copy of labs sent to pt. Initial call taken by: Lynann Beaver CMA,  Oct 06, 2009 3:29 PM

## 2010-06-17 NOTE — Medication Information (Signed)
Summary: rov/kh   Anticoagulant Therapy  Managed by: Weston Brass, PharmD Referring MD: Olga Millers MD PCP: Dianna Limbo, MD Supervising MD: Johney Frame MD, Fayrene Fearing Indication 1: Atrial Fibrillation (ICD-427.31) Indication 2: Atrial Flutter (ICD-427.32) Lab Used: [facility] Site: [facility] INR POC 3.6 INR RANGE 2 - 3  Dietary changes: no    Health status changes: no    Bleeding/hemorrhagic complications: no    Recent/future hospitalizations: no    Any changes in medication regimen? no    Recent/future dental: no  Any missed doses?: no       Is patient compliant with meds? yes       Allergies: 1)  Codeine  Anticoagulation Management History:      The patient is taking warfarin and comes in today for a routine follow up visit.  Positive risk factors for bleeding include an age of 75 years or older and presence of serious comorbidities.  The bleeding index is 'intermediate risk'.  Positive CHADS2 values include History of Diabetes.  Negative CHADS2 values include Age > 48 years old.  The start date was 07/16/2007.  Her last INR was 3.0 ratio.  Anticoagulation responsible provider: Belen Pesch MD, Fayrene Fearing.  INR POC: 3.6.  Cuvette Lot#: [id number].  Exp: 03/2011.    Anticoagulation Management Assessment/Plan:      The patient's current anticoagulation dose is Warfarin sodium 5 mg tabs: Take as directed by coumadin clinic..  The target INR is 2.0-3.0.  The next INR is due 06/16/2010.  Anticoagulation instructions were given to patient.  Results were reviewed/authorized by Weston Brass, PharmD.  She was notified by Linward Headland, PharmD candidate.         Prior Anticoagulation Instructions: INR 2.6  Continue current regimen. Take 0.5 tablets everday except 1 tablet on Mondays and Fridays.  Recheck INR in 4 weeks.   Current Anticoagulation Instructions: INR 3.6 (goal INR: 2-3)  Skip tomorrow's(Thursday) dose.  Resume 1/2 tablet everyday except 1 tablet on Mondays and Fridays. Recheck  in 3 weeks.

## 2010-06-17 NOTE — Progress Notes (Signed)
Summary: refill diazepam   Phone Note From Pharmacy   [facility]  [address]  Summary of Call: refill temazepam last ov 04/2009 Initial call taken by: Pura Spice, RN,  Sep 17, 2009 8:31 AM  Follow-up for Phone Call        call in #30 with no rf Follow-up by: Nelwyn Salisbury MD,  Sep 17, 2009 10:33 AM  Additional Follow-up for Phone Call Additional follow up Details #1::        [facility]  Additional Follow-up by: Pura Spice, RN,  Sep 17, 2009 11:11 AM    New/Updated Medications: TEMAZEPAM 30 MG  CAPS (TEMAZEPAM) 1 capsule at bedtime Prescriptions: TEMAZEPAM 30 MG  CAPS (TEMAZEPAM) 1 capsule at bedtime  #30 x 0   Entered by:   Pura Spice, RN   Authorized by:   Nelwyn Salisbury MD   Signed by:   Pura Spice, RN on 09/17/2009   Method used:   Telephoned to ...       [facility]  [address] [phone][facility] (retail)       [address]       [place], [place]  [address]       Ph: [phone] or [phone]       Fax: [phone]   RxID:   [id number]

## 2010-06-17 NOTE — Progress Notes (Signed)
Summary: Triage   Phone Note Call from Patient Call back at Home Phone [phone]   Caller: Patient Call For: Dr. Russella Dar Reason for Call: Talk to Nurse Complaint: Abdominal Pain Summary of Call: pt. is having a COL on 03-03-10 and wants to know if she can get a EGD at the same time Initial call taken by: Karna Christmas,  February 11, 2010 4:57 PM  Follow-up for Phone Call        Last Endoscopy 2005.  Denies heartburn, dysphagia,  n/v. C/O increased frequency of bloating, states it is now daily.  Pt. wants an Endoscopy along with her Colonoscopy.   DR.STARK PLEASE ADVISE  Follow-up by: Laureen Ochs LPN,  February 12, 2010 9:19 AM  Additional Follow-up for Phone Call Additional follow up Details #1::        EGD probably not indicated for bloating alone. Can try a low gas diet and Gas-X qid prn. Offer visit extender to evaluate to see if EGD needed. Additional Follow-up by: Meryl Dare MD Clementeen Graham,  February 12, 2010 5:30 PM    Additional Follow-up for Phone Call Additional follow up Details #2::    Patient  has decided she does not feel she should have an EGD anymore.  She thanked me for the call Follow-up by: Darcey Nora RN, CGRN,  February 15, 2010 9:06 AM

## 2010-06-17 NOTE — Progress Notes (Signed)
Summary: call for Dr Scotty Court  Phone Note Call from Patient Call back at [facility] Phone [phone]   Caller: Patient Call For: Judithann Sheen MD Summary of Call: wants to talk with Dr Scotty Court- refused to leave any info Initial call taken by: Raechel Ache, RN,  July 09, 2009 9:00 AM  Follow-up for Phone Call        CBGs elevated. to icrease g

## 2010-06-17 NOTE — Progress Notes (Signed)
Summary: meds for nerves  Phone Note Call from Patient   Caller: Patient Call For: Judithann Sheen MD Summary of Call: Message on voice mail. [phone]  Pt states she is in bad shape with her AF.  Would [facility] Dr[facility] Scotty [facility] for this to calm her down.  Is a wreck. Initial call taken by: Lynann Beaver CMA,  Sep 24, 2009 12:04 PM  Follow-up for Phone Call        per dr Scotty Court he can't give her anything and he wants her to call her cardiologist. Follow-up by: Pura Spice, RN,  Sep 24, 2009 12:57 PM  Additional Follow-up for Phone Call Additional follow up Details #1::        Pt. notified. Additional Follow-up by: Lynann Beaver CMA,  Sep 24, 2009 12:58 PM

## 2010-06-17 NOTE — Progress Notes (Signed)
Summary: high sugar  Phone Note Call from Patient Call back at Home Phone [phone]   Caller: Patient Call For: Judithann Sheen MD Summary of Call: FBS 220 this morning after doubling her "sugar pill". Wants to know what to do. Initial call taken by: Raechel Ache, RN,  July 16, 2009 8:17 AM  Follow-up for Phone Call        to start metformin 500 mg two times a day a;so decrease medrol

## 2010-06-17 NOTE — Medication Information (Signed)
Summary: rov/tm  Anticoagulant Therapy  Managed by: Bethena Midget, RN, BSN Referring MD: Olga Millers MD PCP: Judithann Sheen MD Supervising MD: Ladona Ridgel MD, Sharlot Gowda Indication 1: Atrial Fibrillation (ICD-427.31) Indication 2: Atrial Flutter (ICD-427.32) Lab Used: [facility] Site: [facility] INR POC 2.5 INR RANGE 2 - 3  Dietary changes: no    Health status changes: no    Bleeding/hemorrhagic complications: no    Recent/future hospitalizations: no    Any changes in medication regimen? no    Recent/future dental: no  Any missed doses?: no       Is patient compliant with meds? yes       Allergies: No Known Drug Allergies  Anticoagulation Management History:      The patient is taking warfarin and comes in today for a routine follow up visit.  Positive risk factors for bleeding include an age of 75 years or older and presence of serious comorbidities.  The bleeding index is 'intermediate risk'.  Positive CHADS2 values include History of Diabetes.  Negative CHADS2 values include Age > 13 years old.  The start date was 07/16/2007.  Her last INR was 3.0 ratio.  Anticoagulation responsible provider: Ladona Ridgel MD, Sharlot Gowda.  INR POC: 2.5.  Cuvette Lot#: [id number].  Exp: 12/2010.    Anticoagulation Management Assessment/Plan:      The patient's current anticoagulation dose is Warfarin sodium 5 mg tabs: Take as directed by coumadin clinic..  The target INR is 2.0-3.0.  The next INR is due 10/09/2009.  Anticoagulation instructions were given to patient.  Results were reviewed/authorized by Bethena Midget, RN, BSN.  She was notified by [facility].         Prior Anticoagulation Instructions: INR 3.6 Skip today's dose then change dose to 2.5mg s everyday except on Wednesday 1mg . Recheck in 2 weeks.   Current Anticoagulation Instructions: INR-2.5 Change dosing schedule, Take 1 tablet on Sunday and Thursday and 1/2 a tablet on all other days of each week. Return in 2 weeks.

## 2010-06-17 NOTE — Progress Notes (Signed)
Summary: rx refill  Phone Note Call from Patient Call back at Home Phone [phone]   Caller: Patient Call For: Judithann Sheen MD Summary of Call: Pt is callling this message URGENT stating that Dr. Scotty Court called her x 2 Thursday, and needs to speak to his nurse to get this straightened out.  Initial call taken by: Lynann Beaver CMA AAMA,  May 11, 2010 9:29 AM  Follow-up for Phone Call        Phone Call Completed Follow-up by: Kern Reap CMA Duncan Dull),  May 11, 2010 10:10 AM

## 2010-06-17 NOTE — Progress Notes (Signed)
Summary: REQUEST FOR EARLY AM CPX APPT   Phone Note Call from Patient   Caller: Patient  [phone] Summary of Call: Pts husband (Joe) called to adv that the pt needs to have cpx early January....was bumped from Jan. 3rd appt and will need to reschedule.... would like to have cpx in Jan. 2012 and will need an early morning cpx due to being diabetic.... Can you advise?    Pt can be reached at [phone].  Initial call taken by: Debbra Riding,  May 04, 2010 10:08 AM  Follow-up for Phone Call        Contacted pt and appt was scheduled for cpx on 09/16/2010  at 10am.... Pt will need refills before then - refill request sent to Dr Scotty Court ref same.  Follow-up by: Debbra Riding,  May 06, 2010 11:25 AM

## 2010-06-17 NOTE — Letter (Signed)
Summary: New Patient letter  [facility]  [address] [place], [place] [address]   Phone: [phone]  Fax: [phone]       10/09/2009 MRN: [id number]  [name] [address] [place], [place]  [address]  Dear Ms. [name],  Welcome to the Gastroenterology Division at [facility].    You are scheduled to see Dr. Russella Dar on 12/07/2009 at 11:00AM on the 3rd floor at [facility], [address]  We ask that you try to arrive at our office 15 minutes prior to your appointment time to allow for check-in.  We would like you to complete the enclosed self-administered evaluation form prior to your visit and bring it with you on the day of your appointment.  We will review it with you.  Also, please bring a complete list of all your medications or, if you prefer, bring the medication bottles and we will list them.  Please bring your insurance card so that we may make a copy of it.  If your insurance requires a referral to see a specialist, please bring your referral form from your primary care physician.  Co-payments are due at the time of your visit and may be paid by cash, check or credit card.     Your office visit will consist of a consult with your physician (includes a physical exam), any laboratory testing he/she may order, scheduling of any necessary diagnostic testing (e.g. x-ray, ultrasound, CT-scan), and scheduling of a procedure (e.g. Endoscopy, Colonoscopy) if required.  Please allow enough time on your schedule to allow for any/all of these possibilities.    If you cannot keep your appointment, please call [phone] to cancel or reschedule prior to your appointment date.  This allows [place] the opportunity to schedule an appointment for another patient in need of care.  If you do not cancel or reschedule by 5 p.m. the business day prior to your appointment date, you will be charged a $50.00 late cancellation/no-show fee.    Thank you for choosing   Gastroenterology for your medical needs.  We appreciate the opportunity to care for you.  Please visit [place] at our website  to learn more about our practice.                     Sincerely,                                                             The Gastroenterology Division

## 2010-06-17 NOTE — Progress Notes (Signed)
Summary: REFILL REQUEST  Phone Note Refill Request Message from:  Patient on April 12, 2010 11:50 AM  Refills Requested: Medication #1:  TEMAZEPAM 30 MG  CAPS 1 capsule at bedtime   Notes: [facility]  [address]    Initial call taken by: Debbra Riding,  April 12, 2010 11:51 AM  Follow-up for Phone Call        can refill x 1  further refills per Dr Scotty Court Follow-up by: Madelin Headings MD,  April 12, 2010 1:00 PM  Additional Follow-up for Phone Call Additional follow up Details #1::        rx called into pharmacy. Additional Follow-up by: Romualdo Bolk, CMA (AAMA),  April 12, 2010 3:00 PM    Prescriptions: TEMAZEPAM 30 MG  CAPS (TEMAZEPAM) 1 capsule at bedtime  #30 x 0   Entered by:   Romualdo Bolk, CMA (AAMA)   Authorized by:   Madelin Headings MD   Signed by:   Romualdo Bolk, CMA (AAMA) on 04/12/2010   Method used:   Telephoned to ...       [facility] (retail)       [address]       [place], [place]  [address]       Ph: [phone]       Fax: [phone]   RxID:   [id number]

## 2010-06-17 NOTE — Progress Notes (Signed)
Summary: rx refill diazepam 5 mg   Phone Note From Pharmacy   [facility]  Reason for Call: Needs renewal Summary of Call: refill diazepam  Initial call taken by: Pura Spice, RN,  September 01, 2009 8:28 AM  Follow-up for Phone Call        ok per dr Alfonzo Feller and faxed to [facility] [address] with 5 refills  Follow-up by: Pura Spice, RN,  September 01, 2009 8:28 AM    New/Updated Medications: DIAZEPAM 5 MG TABS (DIAZEPAM) 1 by mouth four times a day as needed stress Prescriptions: DIAZEPAM 5 MG TABS (DIAZEPAM) 1 by mouth four times a day as needed stress  #120 x 0   Entered by:   Pura Spice, RN   Authorized by:   Judithann Sheen MD   Signed by:   Pura Spice, RN on 09/01/2009   Method used:   Printed then faxed to ...       [facility] (retail)       [address]       [place], [place]  [address]       Ph: [phone]       Fax: [phone]   RxID:   [id number]

## 2010-06-17 NOTE — Letter (Signed)
Summary: [facility] Instructions  [facility]  [address] [place], [place] [address]   Phone: [phone]  Fax: [phone]       EARLY ORD    Jul 22, 1932    MRN: [id number]        Procedure Day Dorna Bloom: Wednesday October 19th, 2011     Arrival Time: 10:30am       Procedure Time: 11:30am     Location of Procedure:                    _x _  [facility] (4th Floor)                        PREPARATION FOR COLONOSCOPY WITH MOVIPREP   Starting 5 days prior to your procedure 02/26/10 do not eat nuts, seeds, popcorn, corn, beans, peas,  salads, or any raw vegetables.  Do not take any fiber supplements (e.g. Metamucil, Citrucel, and Benefiber).  THE DAY BEFORE YOUR PROCEDURE         DATE: 03/02/10  DAY: Tuesday  1.  Drink clear liquids the entire day-NO SOLID FOOD  2.  Do not drink anything colored red or purple.  Avoid juices with pulp.  No orange juice.  3.  Drink at least 64 oz. (8 glasses) of fluid/clear liquids during the day to prevent dehydration and help the prep work efficiently.  CLEAR LIQUIDS INCLUDE: Water Jello Ice Popsicles Tea (sugar ok, no milk/cream) Powdered fruit flavored drinks Coffee (sugar ok, no milk/cream) Gatorade Juice: apple, white grape, white cranberry  Lemonade Clear bullion, consomm, broth Carbonated beverages (any kind) Strained chicken noodle soup Hard Candy                             4.  In the morning, mix first dose of MoviPrep solution:    Empty 1 Pouch A and 1 Pouch B into the disposable container    Add lukewarm drinking water to the top line of the container. Mix to dissolve    Refrigerate (mixed solution should be used within 24 hrs)  5.  Begin drinking the prep at 5:00 p.m. The MoviPrep container is divided by 4 marks.   Every 15 minutes drink the solution down to the next mark (approximately 8 oz) until the full liter is complete.   6.  Follow completed prep with 16 oz of clear liquid of your  choice (Nothing red or purple).  Continue to drink clear liquids until bedtime.  7.  Before going to bed, mix second dose of MoviPrep solution:    Empty 1 Pouch A and 1 Pouch B into the disposable container    Add lukewarm drinking water to the top line of the container. Mix to dissolve    Refrigerate  THE DAY OF YOUR PROCEDURE      DATE:03/03/10 [id number]  Beginning at 6:30am. (5 hours before procedure):         1. Every 15 minutes, drink the solution down to the next mark (approx 8 oz) until the full liter is complete.  2. Follow completed prep with 16 oz. of clear liquid of your choice.    3. You may drink clear liquids until 9:30am (2 HOURS BEFORE PROCEDURE).   MEDICATION INSTRUCTIONS  Unless otherwise instructed, you should take regular prescription medications with a small sip of water   as early as possible the morning of your procedure.  Diabetic patients - see separate instructions.   Stop taking Coumadin on  _ _  (5 days before procedure).  Additional medication instructions: You will be contaced by our office prior to your procedure for directions on holding your Coumadin/Warfarin.  If you do not hear from our office 1 week prior to your scheduled procedure, please call [phone] to discuss.          OTHER INSTRUCTIONS  You will need a responsible adult at least 75 years of age to accompany you and drive you home.   This person must remain in the waiting room during your procedure.  Wear loose fitting clothing that is easily removed.  Leave jewelry and other valuables at home.  However, you may wish to bring a book to read or  an iPod/MP3 player to listen to music as you wait for your procedure to start.  Remove all body piercing jewelry and leave at home.  Total time from sign-in until discharge is approximately 2-3 hours.  You should go home directly after your procedure and rest.  You can resume normal activities the  day after your  procedure.  The day of your procedure you should not:   Drive   Make legal decisions   Operate machinery   Drink alcohol   Return to work  You will receive specific instructions about eating, activities and medications before you leave.    The above instructions have been reviewed and explained to me by   Marchelle Folks.     I fully understand and can verbalize these instructions _____________________________ Date _________

## 2010-06-17 NOTE — Progress Notes (Signed)
Summary: speak to Dr. Scotty Court  Phone Note Call from Patient   Caller: Patient Call For: Judithann Sheen MD Summary of Call: Pt was admitted to the hospital last week with chest pain and told to schedule an appt with Dr. Scotty Court.  Will not schedule until she speaks to Dr. Scotty Court. [phone] Initial call taken by: Lynann Beaver CMA,  June 10, 2009 9:10 AM  Follow-up for Phone Call        talked to patient for25 minutesand discussed copd and heart disease

## 2010-06-17 NOTE — Assessment & Plan Note (Signed)
Summary: YEARLY/SL  Medications Added PRAVASTATIN SODIUM 80 MG TABS (PRAVASTATIN SODIUM) once daily      Allergies Added:   Visit Type:  1 year OV Referring Provider:  Rickard Patience, MD Primary Provider:  Dianna Limbo, MD  CC:  No complaints.  History of Present Illness:    [name] is seen in followup for paroxysmal atrial fibrillation with a rapid ventricular response. This occurs in the context of hypertension and diabetes.    Problems Prior to Update: 1)  Coumadin Therapy  (ICD-V58.61) 2)  Coumadin Therapy  (ICD-V58.61) 3)  Change in Bowels  (ICD-787.99) 4)  Anxiety Depression  (ICD-300.4) 5)  Colonic Polyps  (ICD-211.3) 6)  Muscle Pain  (ICD-729.1) 7)  Dyspnea  (ICD-786.05) 8)  COPD  (ICD-496) 9)  Lbbb  (ICD-426.3) 10)  Bradycardia-tachycardia Syndrome  (ICD-427.81) 11)  Diabetic Peripheral Neuropathy  (ICD-250.60) 12)  Acquired Deformity of Chest and Rib  (ICD-738.3) 13)  Allergic Rhinitis  (ICD-477.9) 14)  Low Back Pain, Chronic  (ICD-724.2) 15)  Diabetes Mellitus, Type II, Uncontrolled  (ICD-250.02) 16)  Vitamin D Deficiency  (ICD-268.9) 17)  Hypothyroidism  (ICD-244.9) 18)  Atrial Fibrillation  (ICD-427.31) 19)  Chest Discomfort  (ICD-786.59) 20)  Disc Disease, Lumbosacral Spine  (ICD-722.52) 21)  Arthritis  (ICD-716.90) 22)  Anemia  (ICD-285.9) 23)  Uns Advrs Eff Oth Rx Medicinal&biological Sbstnc  (ICD-995.29) 24)  Hyperlipidemia  (ICD-272.4)  Current Medications (verified): 1)  Verapamil Hcl Cr 180 Mg Cr-Tabs (Verapamil Hcl) .... Take 1 Tablet By Mouth Two Times A Day 2)  Temazepam 30 Mg  Caps (Temazepam) .Marland Kitchen.. 1 Capsule At Bedtime 3)  Glimepiride 4 Mg Tabs (Glimepiride) .... Take 1/2 Tab By Mouth Two Times A Day 4)  Paxil 40 Mg  Tabs (Paroxetine Hcl) .Marland Kitchen.. 1 By Mouth Once Daily 5)  Simvastatin 80 Mg Tabs (Simvastatin) .Marland Kitchen.. 1 Hs 6)  Warfarin Sodium 5 Mg Tabs (Warfarin Sodium) .... Take As Directed By Coumadin Clinic. 7)  Metoprolol Tartrate 25  Mg Tabs (Metoprolol Tartrate) .... Take 1/2 Tablet Two Times A Day 8)  Diazepam 5 Mg Tabs (Diazepam) .Marland Kitchen.. 1 By Mouth Four Times A Day As Needed Stress 9)  Hydrocodone-Acetaminophen 10-650 Mg Tabs (Hydrocodone-Acetaminophen) .Marland Kitchen.. 1 By Mouth Every 4-6 Hrs As Needed Pain. Not To Exceed 4 Per Day. 10)  One Touch Ultra Strips .... Test in Am 11)  Metformin Hcl 500 Mg Tabs (Metformin Hcl) .Marland Kitchen.. 1 Bid 12)  Duragesic-50 50 Mcg/hr Pt72 (Fentanyl) .Marland Kitchen.. 1 Patch Every 3 Days For Pain 13)  Altace 5 Mg Caps (Ramipril) .... Take 1 Capsule By Mouth Once A Day 14)  Vitamin D 1000 Unit Tabs (Cholecalciferol) .... Take 1 Tablet By Mouth Once A Day 15)  Align  Caps (Probiotic Product) .... One Capsule By Mouth Once Daily X 1 Month 16)  Metamucil [facility] Fiber 58.6 % Powd (Psyllium) .... Once Daily 17)  Levsin/sl 0.125 Mg Subl (Hyoscyamine Sulfate) .Marland Kitchen.. 1-2 Sl Qid As Needed For Abdominal Cramping and Diarrhea  Allergies (verified): 1)  Codeine  Past History:  Past Medical History: Last updated: 2010/03/14 Arthritis HTN Hyperlipideminaia Diabetes mellitus, type I Paroxysmal atrial fibrillation Fatigue Incomplete left bundle-branch block with left axis deviation Chronic back pain-narcotic dependence Lumbar disc disease Mitral valve prolapse Macular degeneration Esophageal Stricture Gastritis, RUT negative, 2005 Diverticulosis Hyperplastic colon polyps  03/2004 Hemorrhoids Alcohol use-dependence  Past Surgical History: Last updated: 12/14/2009 Right ankle surgery Knee Arthroscopy--right  Family History: Last updated: 03-14-10 Mother died at age 15, heart disease  father died at age 26, throat cancer.  She does have a sister that is alive and also has atrial  fibrillation.   Social History: Last updated: 12/14/2009 Retired --[facility] Married, 1 girl Tobacco Use - Yes.  1/2 ppd.  started at age 52.   Alcohol Use - yes-Daily Daily Caffeine Use 2 cups of 1/2  caf Illicit Drug Use - no  Risk Factors: Smoking Status: current (04/29/2009) Packs/Day: 0.5 (04/29/2009)  Vital Signs:  Patient profile:   77 year old female Height:      61 inches Weight:      150.50 pounds BMI:     28.54 Pulse rate:   61 / minute BP sitting:   120 / 64  (left arm) Cuff size:   regular  Vitals Entered By: Caralee Ates CMA (March 15, 2010 2:06 PM)  Physical Exam  General:  The patient was alert and oriented in no acute distress. HEENT Normal.  Neck veins were flat, carotids were brisk.  Lungs were clear.  Heart sounds were regular without murmurs or gallops.  Abdomen was soft with active bowel sounds. There is no clubbing cyanosis or edema. Skin Warm and dry    Impression & Recommendations:  Problem # 1:  ATRIAL FIBRILLATION (ICD-427.31)  E. sinus rhythm. She is appropriately on warfarin Her updated medication list for this problem includes:    Warfarin Sodium 5 Mg Tabs (Warfarin sodium) .Marland Kitchen... Take as directed by coumadin clinic.    Metoprolol Tartrate 25 Mg Tabs (Metoprolol tartrate) .Marland Kitchen... Take 1/2 tablet two times a day  Her updated medication list for this problem includes:    Warfarin Sodium 5 Mg Tabs (Warfarin sodium) .Marland Kitchen... Take as directed by coumadin clinic.    Metoprolol Tartrate 25 Mg Tabs (Metoprolol tartrate) .Marland Kitchen... Take 1/2 tablet two times a day  Problem # 2:  HYPERLIPIDEMIA (ICD-272.4) SHe has previously been ont on simvastatin. Drug interactions with verapamil  however require adjusting of her statin. We'll discontinue the simvastatin and begin her on pravastatin  Her updated medication list for this problem includes:    Simvastatin 80 Mg Tabs (Simvastatin) .Marland Kitchen... 1 hs  Patient Instructions: 1)  Your physician has recommended you make the following change in your medication: Stop Simvastation, Start Pravastatin 2)  Your physician wants you to follow-up in: 1 year  You will receive a reminder letter in the mail two months in advance.  If you don't receive a letter, please call our office to schedule the follow-up appointment. Prescriptions: PRAVASTATIN SODIUM 80 MG TABS (PRAVASTATIN SODIUM) once daily  #30 x 11   Entered by:   Claris Gladden RN   Authorized by:   Nathen May, MD, [facility]   Signed by:   Claris Gladden RN on 03/15/2010   Method used:   Electronically to        [facility] [address] (retail)       [address]       [place], [place]  [address]       Ph: [phone]       Fax: [phone]   RxID:   [id number]

## 2010-06-17 NOTE — Progress Notes (Signed)
Summary: REQ FOR EARLY AM CPX  Phone Note Call from Patient   Caller: Patient's Husband   [phone] Summary of Call: Pts husband called to request a refill on pts med but also wanted to reschedule bumped cpx appt for his wife.... He adv that pt will not be able to go till 11:00am without eating, states she will need an early morning appt due to hypoglycemic concerns... Can you advise?  Initial call taken by: Debbra Riding,  April 12, 2010 11:54 AM  Follow-up for Phone Call        Pt req refill of Temazepam. [facility]s [facility]. Need this med called in today.  Follow-up by: Lucy Antigua,  April 12, 2010 4:42 PM  Additional Follow-up for Phone Call Additional follow up Details #1::        call in #30 of temazepam. As for moving the cpx time, that would be up to Dr. Scotty Court  Additional Follow-up by: Nelwyn Salisbury MD,  April 12, 2010 4:54 PM    Additional Follow-up for Phone Call Additional follow up Details #2::    pt aware.  Follow-up by: Pura Spice, RN,  April 12, 2010 5:23 PM  Prescriptions: TEMAZEPAM 30 MG  CAPS (TEMAZEPAM) 1 capsule at bedtime  #30 x 0   Entered by:   Pura Spice, RN   Authorized by:   Nelwyn Salisbury MD   Signed by:   Pura Spice, RN on 04/12/2010   Method used:   [facility]phoned to ...       [facility]  [address] [phone][facility] ([facility]ail)       [address]       [place], [place]  [address]       Ph: [phone] or [phone]       Fax: [phone]   RxID:   [id number]

## 2010-06-17 NOTE — Medication Information (Signed)
Summary: rov/kh  Anticoagulant Therapy  Managed by: Weston Brass, PharmD Referring MD: Olga Millers MD PCP: Dianna Limbo, MD Supervising MD: Clifton James MD, Cristal Deer Indication 1: Atrial Fibrillation (ICD-427.31) Indication 2: Atrial Flutter (ICD-427.32) Lab Used: [facility] Site: [facility] INR POC 1.9 INR RANGE 2 - 3  Dietary changes: no    Health status changes: no    Bleeding/hemorrhagic complications: no    Recent/future hospitalizations: no    Any changes in medication regimen? no    Recent/future dental: no  Any missed doses?: no       Is patient compliant with meds? yes      Comments: Pt didn't want to return in 2 weeks, will be returning in 3 weeks.  Allergies: 1)  Codeine  Anticoagulation Management History:      The patient is taking warfarin and comes in today for a routine follow up visit.  Positive risk factors for bleeding include an age of 75 years or older and presence of serious comorbidities.  The bleeding index is 'intermediate risk'.  Positive CHADS2 values include History of Diabetes.  Negative CHADS2 values include Age > 88 years old.  The start date was 07/16/2007.  Her last INR was 3.0 ratio.  Anticoagulation responsible provider: Clifton James MD, Cristal Deer.  INR POC: 1.9.  Cuvette Lot#: [id number].  Exp: 03/2011.    Anticoagulation Management Assessment/Plan:      The patient's current anticoagulation dose is Warfarin sodium 5 mg tabs: Take as directed by coumadin clinic..  The target INR is 2.0-3.0.  The next INR is due 04/22/2010.  Anticoagulation instructions were given to patient.  Results were reviewed/authorized by Weston Brass, PharmD.  She was notified by Hoy Register, PharmD Candidate.         Prior Anticoagulation Instructions: INR 1.6  Take 2 whole tablets today, then resume normal schedule of 1/2 tablet (2.5 mg) everyday except take 1 tablet (5 mg) on Monday and Friday.  Re-check INR in 2 weeks.   Current Anticoagulation  Instructions: INR 1.9 Take 1 tablet today then resume previous dose of 0.5 tablet everyday except 1 tablet on Monday and Friday Recheck INR in 3 weeks

## 2010-06-17 NOTE — Assessment & Plan Note (Signed)
Summary: breast mass/dm   Vital Signs:  Patient profile:   77 year old female Weight:      150 pounds Pulse rate:   94 / minute Pulse rhythm:   regular BP sitting:   130 / 80  (right arm) Cuff size:   regular  Vitals Entered By: Kyung Rudd, CMA (May 31, 2010 3:22 PM) CC: pt c/o new huge mass on left breast   Primary Care Provider:  Dianna Limbo, MD  CC:  pt c/o new huge mass on left breast.  History of Present Illness: Patient presents to clinic as a workin for evaluation of possible breast mass. Reviewed past h/o breast masses as well as most recent dx mammogram and [place] dated 03/2010 without suspicious lesion. Notes 2 day h/o painful soft tissue mass noted in left breast. Denies f/c, nipple drainage or wound. Does regular SBE and the mass was not previously noted. Does admit to recent fall with her left anterior chest striking a table. No LOC or other injury noted by pt. Has chronic anticoagulation with coumadin given h/o atrial fibrillation.  Current Medications (verified): 1)  Verapamil Hcl Cr 180 Mg Cr-Tabs (Verapamil Hcl) .... Take 1 Tablet By Mouth Two Times A Day 2)  Temazepam 30 Mg  Caps (Temazepam) .Marland Kitchen.. 1 Capsule At Bedtime 3)  Glimepiride 4 Mg Tabs (Glimepiride) .... Take 1/2 Tab By Mouth Two Times A Day 4)  Paxil 40 Mg  Tabs (Paroxetine Hcl) .Marland Kitchen.. 1 By Mouth Once Daily 5)  Warfarin Sodium 5 Mg Tabs (Warfarin Sodium) .... Take As Directed By Coumadin Clinic. 6)  Metoprolol Tartrate 25 Mg Tabs (Metoprolol Tartrate) .... Take 1/2 Tablet Two Times A Day 7)  Diazepam 5 Mg Tabs (Diazepam) .Marland Kitchen.. 1 By Mouth Four Times A Day As Needed Stress 8)  Hydrocodone-Acetaminophen 10-650 Mg Tabs (Hydrocodone-Acetaminophen) .Marland Kitchen.. 1 By Mouth Every 4-6 Hrs As Needed Pain. Not To Exceed 4 Per Day. 9)  One Touch Ultra Strips .... Test in Am 10)  Metformin Hcl 500 Mg Tabs (Metformin Hcl) .Marland Kitchen.. 1 Bid 11)  Duragesic-50 50 Mcg/hr Pt72 (Fentanyl) .Marland Kitchen.. 1 Patch Every 3 Days For Pain 12)   Altace 5 Mg Caps (Ramipril) .... Take 1 Capsule By Mouth Once A Day 13)  Vitamin D 1000 Unit Tabs (Cholecalciferol) .... Take 1 Tablet By Mouth Once A Day 14)  Align  Caps (Probiotic Product) .... One Capsule By Mouth Once Daily X 1 Month 15)  Metamucil Multihealth Fiber 58.6 % Powd (Psyllium) .... Once Daily 16)  Pravastatin Sodium 80 Mg Tabs (Pravastatin Sodium) .... Once Daily  Allergies (verified): 1)  Codeine  Past History:  Past medical, surgical, family and social histories (including risk factors) reviewed, and no changes noted (except as noted below).  Past Medical History: Reviewed history from 03/12/2010 and no changes required. Arthritis HTN Hyperlipideminaia Diabetes mellitus, type I Paroxysmal atrial fibrillation Fatigue Incomplete left bundle-branch block with left axis deviation Chronic back pain-narcotic dependence Lumbar disc disease Mitral valve prolapse Macular degeneration Esophageal Stricture Gastritis, RUT negative, 2005 Diverticulosis Hyperplastic colon polyps  03/2004 Hemorrhoids Alcohol use-dependence  Past Surgical History: Reviewed history from 12/14/2009 and no changes required. Right ankle surgery Knee Arthroscopy--right  Family History: Reviewed history from 03/12/2010 and no changes required. Mother died at age 20, heart disease  father died at age 14, throat cancer.  She does have a sister that is alive and also has atrial  fibrillation.   Social History: Reviewed history from 12/14/2009 and no changes required. Retired --  [facility] Married, 1 girl Tobacco Use - Yes.  1/2 ppd.  started at age 63.   Alcohol Use - yes-Daily Daily Caffeine Use 2 cups of 1/2 caf Illicit Drug Use - no  Review of Systems      See HPI General:  Denies chills, fever, and sweats. Derm:  Complains of changes in color of skin and lesion(s); denies itching and rash.  Physical Exam  General:  Well-developed,well-nourished,in no  acute distress; alert,appropriate and cooperative throughout examination Head:  Normocephalic and atraumatic without obvious abnormalities. No apparent alopecia or balding. Eyes:  pupils equal, pupils round, and corneas and lenses clear.   Ears:  no external deformities.   Nose:  no external deformity.   Msk:  Left ant shoulder NT. No bony abn. Skin:  turgor normal, color normal, and no rashes.  +ecchymosis overlying left breast and left shoulder. Additional Exam:  CBE performed with female nurse escort. +ecchymosis overlying left breast. Soft tissue mass located inner left breast  ~1 oclock. +tenderness   Impression & Recommendations:  Problem # 1:  BREAST MASS, LEFT (ICD-611.72) Assessment New Exam and hx most consistent with hematoma with associated ecchymosis. Recommend conservative care and re-evaluate in 1-2 weeks. Pt denies need for prescription pain medication. May take otc tylenol as needed.  Complete Medication List: 1)  Verapamil Hcl Cr 180 Mg Cr-tabs (Verapamil hcl) .... Take 1 tablet by mouth two times a day 2)  Temazepam 30 Mg Caps (Temazepam) .Marland Kitchen.. 1 capsule at bedtime 3)  Glimepiride 4 Mg Tabs (Glimepiride) .... Take 1/2 tab by mouth two times a day 4)  Paxil 40 Mg Tabs (Paroxetine hcl) .Marland Kitchen.. 1 by mouth once daily 5)  Warfarin Sodium 5 Mg Tabs (Warfarin sodium) .... Take as directed by coumadin clinic. 6)  Metoprolol Tartrate 25 Mg Tabs (Metoprolol tartrate) .... Take 1/2 tablet two times a day 7)  Diazepam 5 Mg Tabs (Diazepam) .Marland Kitchen.. 1 by mouth four times a day as needed stress 8)  Hydrocodone-acetaminophen 10-650 Mg Tabs (Hydrocodone-acetaminophen) .Marland Kitchen.. 1 by mouth every 4-6 hrs as needed pain. not to exceed 4 per day. 9)  One Touch Ultra Strips  .... Test in am 10)  Metformin Hcl 500 Mg Tabs (Metformin hcl) .Marland Kitchen.. 1 bid 11)  Duragesic-50 50 Mcg/hr Pt72 (Fentanyl) .Marland Kitchen.. 1 patch every 3 days for pain 12)  Altace 5 Mg Caps (Ramipril) .... Take 1 capsule by mouth once a day 13)   Vitamin D 1000 Unit Tabs (Cholecalciferol) .... Take 1 tablet by mouth once a day 14)  Align Caps (Probiotic product) .... One capsule by mouth once daily x 1 month 15)  Metamucil Multihealth Fiber 58.6 % Powd (Psyllium) .... Once daily 16)  Pravastatin Sodium 80 Mg Tabs (Pravastatin sodium) .... Once daily  Patient Instructions: 1)  Followup with Dr. Rodena Medin in 1and a half weeks   Orders Added: 1)  Est. Patient Level III [[address]]

## 2010-06-17 NOTE — Progress Notes (Signed)
Summary: Cancelled CVRR follow-up  Phone Note Outgoing Call   Call placed by: Cloyde Reams RN,  August 11, 2009 3:53 PM Call placed to: Patient Summary of Call: Pt's called LM on CVRR clinic VM cancelling today's CVRR appt.  Called back pt having dizziness and elevated BS has talked to Dr Charmian Muff RN and her primary MD is addressing these issues, but unable to make today's appt.  Husband has now started back to work and pt does not drive has transportation issues.  Advised it is Very important to have INR checked ASAP last check INR was 3.0 and pt was on day 4 of a 12 day prednisone taper.  Wanted pt to f/u on 08/05/09 for recheck, pt insisted on returning after pred taper completed appt made for 08/07/09.  Pt cancelled this appt and r/s to today 08/11/09.  Pt's INR recently jumped to 7 upon hospital admission after multiple medrol dose packs.  Pt and her husband are aware of the risks and dangers associated with coumadin, steroids and bleeding, even risk of death.  Pt's husband states they are unable to come into clinic today and at this time are unable to make f/u appt d/t transportation issues.  Will await call back from pt or husband to r/s CVRR appt.  Will make Dr Scotty Court and Dr Graciela Husbands aware. Initial call taken by: Cloyde Reams RN,  August 11, 2009 4:02 PM

## 2010-06-17 NOTE — Medication Information (Signed)
Summary: rov/jk  Anticoagulant Therapy  Managed by: Weston Brass, PharmD Referring MD: Olga Millers MD PCP: Dianna Limbo, MD Supervising MD: Juanda Chance MD, Bruce Indication 1: Atrial Fibrillation (ICD-427.31) Indication 2: Atrial Flutter (ICD-427.32) Lab Used: [facility] Site: [facility] INR POC 2.2 INR RANGE 2 - 3  Dietary changes: no    Health status changes: no    Bleeding/hemorrhagic complications: no    Recent/future hospitalizations: no    Any changes in medication regimen? no    Recent/future dental: no  Any missed doses?: no       Is patient compliant with meds? yes      Comments: Pt has colonoscopy with  Gastroenterology on 10/19 and needs to stop Coumadin on 10/14. Pt has appointment with Dr. Graciela Husbands on 10/31 and does not want to come back to re-check Coumadin until then due to transportation and mobility issues. Pt was told that we normally like to see them back 1 week after re-starting Coumadin, but pt is aware of the risk and has been informed to call us with any problems.   Allergies: 1)  Codeine  Anticoagulation Management History:      The patient is taking warfarin and comes in today for a routine follow up visit.  Positive risk factors for bleeding include an age of 75 years or older and presence of serious comorbidities.  The bleeding index is 'intermediate risk'.  Positive CHADS2 values include History of Diabetes.  Negative CHADS2 values include Age > 66 years old.  The start date was 07/16/2007.  Her last INR was 3.0 ratio.  Anticoagulation responsible provider: Juanda Chance MD, Smitty Cords.  INR POC: 2.2.  Cuvette Lot#: [id number].  Exp: 03/2011.    Anticoagulation Management Assessment/Plan:      The patient's current anticoagulation dose is Warfarin sodium 5 mg tabs: Take as directed by coumadin clinic..  The target INR is 2.0-3.0.  The next INR is due 03/15/2010.  Anticoagulation instructions were given to patient.  Results were reviewed/authorized by Weston Brass, PharmD.  She was notified by Harrel Carina, PharmD candidate.         Prior Anticoagulation Instructions: INR 2.1  Continue taking 1/2 tablet (2.5mg ) every day except take 1 tablet (5mg ) on Mondays and Fridays.  Recheck in 4 weeks.    Current Anticoagulation Instructions: INR 2.2  Continue taking 1/2 tablet everyday except take 1 tablet on Mondays and Fridays. Stop Coumadin on 02/26/10 prior to colonscopy. When the doctor doing the procedure tells you to restart the Coumadin, resume your regular dose. Re-check INR on 03/15/10. Call us if you have any questions or problems.

## 2010-06-17 NOTE — Procedures (Signed)
Summary: Colonoscopy  Patient: [name] Note: All result statuses are Final unless otherwise noted.  Tests: (1) Colonoscopy (COL)   COL Colonoscopy           DONE     [facility]     [address]     [place], [place]  [address]           COLONOSCOPY PROCEDURE REPORT           PATIENT:  [name], [name]  MR#:  [id number]     BIRTHDATE:  [birth year], 76 yrs. old  GENDER:  female     ENDOSCOPIST:  Judie Petit T. Russella Dar, MD, [facility]           PROCEDURE DATE:  03/03/2010     PROCEDURE:  Colonoscopy with snare polypectomy     ASA CLASS:  Class III     INDICATIONS:  1) change in bowel habits     MEDICATIONS:   Fentanyl 50 mcg IV, Versed 7 mg IV, Benadryl 12.5     mg IV     DESCRIPTION OF PROCEDURE:   After the risks benefits and     alternatives of the procedure were thoroughly explained, informed     consent was obtained.  Digital rectal exam was performed and     revealed no abnormalities.   The LB [id number] endoscope     was introduced through the anus and advanced to the cecum, which     was identified by both the appendix and ileocecal valve, without     limitations.  The quality of the prep was good, using MoviPrep.     The instrument was then slowly withdrawn as the colon was fully     examined.     <<PROCEDUREIMAGES>>     FINDINGS:  A sessile polyp was found in the ascending colon. It     was 5 mm in size. Polyp was snared without cautery. Retrieval was     successful.  Mild diverticulosis was found in the ascending colon.     Moderate diverticulosis was found in the sigmoid to descending     colon. A sessile polyp was found in the rectum. It was 4 mm in     size. Polyp was snared without cautery. Retrieval was successful.     This was otherwise a normal examination of the colon. Retroflexed     views in the rectum revealed internal hemorrhoids, small.  The     time to cecum =  1.67  minutes. The scope was then withdrawn (time     =  11.25  min) from the patient  and the procedure completed.           COMPLICATIONS:  None           ENDOSCOPIC IMPRESSION:     1) 5 mm sessile polyp in the ascending colon     2) Mild diverticulosis in the ascending colon     3) Moderate diverticulosis in the sigmoid to descending colon     4) 4 mm sessile polyp in the rectum     5) Internal hemorrhoids           RECOMMENDATIONS:     1) Await pathology results     2) High fiber diet with liberal fluid intake.     3) If the polyps removed today are adenomatous (pre-cancerous),     repeat colonoscopy in 5 years. Otherwise no plan for routine  screening colonoscopy due to age and comorbidities.     4) Resume Coumadin today           Dason Mosley T. Russella Dar, MD, [facility]           CC: Dianna Limbo, MD           n.     Rosalie DoctorVenita Lick. Udell Mazzocco at 03/03/2010 12:31 PM           [name], [id number]  Note: An exclamation mark (!) indicates a result that was not dispersed into the flowsheet. Document Creation Date: 03/03/2010 12:31 PM _______________________________________________________________________  (1) Order result status: Final Collection or observation date-time: 03/03/2010 12:25 Requested date-time:  Receipt date-time:  Reported date-time:  Referring Physician:   Ordering Physician: Claudette Head [id number]) Specimen Source:  Source: Launa Grill Order Number: [id number] Lab site:

## 2010-06-17 NOTE — Medication Information (Signed)
Summary: cvrr  Anticoagulant Therapy  Managed by: Bethena Midget, RN, BSN Referring MD: Olga Millers MD Supervising MD: Juanda Chance MD, Awanda Wilcock Indication 1: Atrial Fibrillation (ICD-427.31) Indication 2: Atrial Flutter (ICD-427.32) Lab Used: [facility] Site: [facility] INR POC 2.3 INR RANGE 2 - 3  Dietary changes: no    Health status changes: no       Details: on 06/07/09 admitted to [facility] and d/c 06/08/09. Going to have COPD workup she states  Bleeding/hemorrhagic complications: no    Recent/future hospitalizations: no    Any changes in medication regimen? yes       Details: Doxycycline, Combivent and Prednisone for 7 days. Finsihed 2-3 days ago she states.   Recent/future dental: no  Any missed doses?: no       Is patient compliant with meds? yes       Allergies: No Known Drug Allergies  Anticoagulation Management History:      The patient is taking warfarin and comes in today for a routine follow up visit.  Positive risk factors for bleeding include an age of 75 years or older and presence of serious comorbidities.  The bleeding index is 'intermediate risk'.  Positive CHADS2 values include History of Diabetes.  Negative CHADS2 values include Age > 23 years old.  The start date was 07/16/2007.  Anticoagulation responsible provider: Juanda Chance MD, Smitty Cords.  INR POC: 2.3.  Cuvette Lot#: [id number].  Exp: 08/2010.    Anticoagulation Management Assessment/Plan:      The patient's current anticoagulation dose is Warfarin sodium 5 mg tabs: Take as directed by coumadin clinic..  The target INR is 2.0-3.0.  The next INR is due 07/16/2009.  Anticoagulation instructions were given to patient.  Results were reviewed/authorized by Bethena Midget, RN, BSN.  She was notified by Bethena Midget, RN, BSN.         Prior Anticoagulation Instructions: INR 2.1  Continue 1 tab each Sunday and Thursday and 0.5 tab on all other days.    Recheck in 4 weeks.    Current Anticoagulation Instructions: INR  2.3 Continue 2.5mg s daily except 5mg s on Sundays and Thursdays. Recheck in 4 weeks.

## 2010-06-17 NOTE — Medication Information (Signed)
Summary: rov/ln   Anticoagulant Therapy  Managed by: Weston Brass, PharmD Referring MD: Olga Millers MD PCP: Dianna Limbo, MD Supervising MD: Johney Frame MD, Fayrene Fearing Indication 1: Atrial Fibrillation (ICD-427.31) Indication 2: Atrial Flutter (ICD-427.32) Lab Used: [facility] Site: [facility] INR POC 2.1 INR RANGE 2 - 3  Dietary changes: no    Health status changes: no    Bleeding/hemorrhagic complications: no    Recent/future hospitalizations: no    Any changes in medication regimen? no    Recent/future dental: no  Any missed doses?: no       Is patient compliant with meds? yes       Allergies: 1)  Codeine  Anticoagulation Management History:      The patient is taking warfarin and comes in today for a routine follow up visit.  Positive risk factors for bleeding include an age of 7 years or older and presence of serious comorbidities.  The bleeding index is 'intermediate risk'.  Positive CHADS2 values include History of Diabetes.  Negative CHADS2 values include Age > 64 years old.  The start date was 07/16/2007.  Her last INR was 3.0 ratio.  Anticoagulation responsible provider: Allred MD, Fayrene Fearing.  INR POC: 2.1.  Cuvette Lot#: [id number].  Exp: 02/2011.    Anticoagulation Management Assessment/Plan:      The patient's current anticoagulation dose is Warfarin sodium 5 mg tabs: Take as directed by coumadin clinic..  The target INR is 2.0-3.0.  The next INR is due 02/03/2010.  Anticoagulation instructions were given to patient.  Results were reviewed/authorized by Weston Brass, PharmD.  She was notified by Gweneth Fritter, PharmD Candidate.         Prior Anticoagulation Instructions: INR 2.2  Continue on same dose of 1 tab on Monday and Friday and 1/2 tab all other days.  Re-check in 4 weeks.  Current Anticoagulation Instructions: INR 2.1  Continue taking 1/2 tablet (2.5mg ) every day except take 1 tablet (5mg ) on Mondays and Fridays.  Recheck in 4 weeks.    Prescriptions: WARFARIN SODIUM 5 MG TABS (WARFARIN SODIUM) Take as directed by coumadin clinic.  #90 Tablet x 1   Entered by:   Reina Fuse PharmD   Authorized by:   Nathen May, MD, [facility]   Signed by:   Reina Fuse PharmD on 01/06/2010   Method used:   Electronically to        [facility] (retail)       [address]       [place], [place]  [address]       Ph: [phone]       Fax: [phone]   RxID:   [id number]

## 2010-06-17 NOTE — Progress Notes (Signed)
Summary: Triage   Phone Note Call from Patient Call back at Home Phone [phone]   Caller: Patient Call For: Dr. Russella Dar Reason for Call: Talk to Nurse Summary of Call: Has an appt. this morning and said "I cannot stay out of the bathroom long enough to get dressed" Initial call taken by: Karna Christmas,  March 03, 2010 8:47 AM  Follow-up for Phone Call        pt. worried about still going to bathroom and trying to get here suggested to pt. to apply a pad and layer car seat the plastic bag pt.stated you have been very helpful. Follow-up by: Greer Ee RN,  March 03, 2010 9:07 AM

## 2010-06-17 NOTE — Medication Information (Signed)
Summary: rov/sp  Anticoagulant Therapy  Managed by: Weston Brass, PharmD Referring MD: Olga Millers MD PCP: Judithann Sheen MD Supervising MD: Gala Romney MD, Reuel Boom Indication 1: Atrial Fibrillation (ICD-427.31) Indication 2: Atrial Flutter (ICD-427.32) Lab Used: [facility] Site: [facility] INR POC 2.2 INR RANGE 2 - 3  Dietary changes: no    Health status changes: no    Bleeding/hemorrhagic complications: no    Recent/future hospitalizations: yes       Details: pt is to have colonoscopy but no scheduled date yet, instructed pt to call us when she found out more information  Any changes in medication regimen? no    Recent/future dental: no  Any missed doses?: no       Is patient compliant with meds? yes       Allergies: No Known Drug Allergies  Anticoagulation Management History:      The patient is taking warfarin and comes in today for a routine follow up visit.  Positive risk factors for bleeding include an age of 75 years or older and presence of serious comorbidities.  The bleeding index is 'intermediate risk'.  Positive CHADS2 values include History of Diabetes.  Negative CHADS2 values include Age > 55 years old.  The start date was 07/16/2007.  Her last INR was 3.0 ratio.  Anticoagulation responsible provider: Bekah Igoe MD, Reuel Boom.  INR POC: 2.2.  Cuvette Lot#: [id number].  Exp: 02/2011.    Anticoagulation Management Assessment/Plan:      The patient's current anticoagulation dose is Warfarin sodium 5 mg tabs: Take as directed by coumadin clinic..  The target INR is 2.0-3.0.  The next INR is due 01/07/2010.  Anticoagulation instructions were given to patient.  Results were reviewed/authorized by Weston Brass, PharmD.  She was notified by Dillard Cannon.         Prior Anticoagulation Instructions: INR 2.3  Continue same dose of 1/2 tablet every day except 1 tablet on Monday and Friday.   Current Anticoagulation Instructions: INR 2.2  Continue on same dose of 1  tab on Monday and Friday and 1/2 tab all other days.  Re-check in 4 weeks.

## 2010-06-17 NOTE — Progress Notes (Signed)
Summary: not better  Phone Note Call from Patient Call back at [facility] Phone [phone]   Summary of Call: Was to call report when meds finished.  She has finished her prescriptions prednisone & doxycycline & is out of combivent.  No refills on any she thinks.  Not better. Cough is very strenuous & gagging.  Lung infection - is coughing, productive of clear mucus.  No fever.  Not short of breath.  Chest does not hurt.  Is at least the same if not worse.  Is taking liquids & foods.   Plumbing is good.  HT Friendly.  NKDA.   Initial call taken by: Rudy Jew, RN,  June 16, 2009 9:52 AM  Follow-up for Phone Call        will call for pt to come in for examonation     Appended Document: not better Orders sent to [facility] for Pulmonary Consult.

## 2010-06-17 NOTE — Progress Notes (Signed)
Summary: wants medd that Annette Stable knows  Phone Note Call from Patient Call back at [facility] Phone [phone]   Summary of Call: Chest is tight & painful again as when she ended up in hospital.  Wants med, Annette Stable will know all about it & what she needs, without coming for ov.  Requests Dr. Satira Sark call her.  [facility]  NKDA. Initial call taken by: Rudy Jew, RN,  June 24, 2009 3:14 PM  Follow-up for Phone Call        will call patient Follow-up by: Judithann Sheen MD,  June 25, 2009 11:56 AM

## 2010-06-17 NOTE — Progress Notes (Signed)
Summary: fentanyl  Phone Note Call from Patient Call back at [facility] Phone [phone]   Summary of Call: Fentanyl patches one q 3 days for back pain, cervicl to lumbosacral.  Has 2 weeks worth.  Gets them ahead.  [facility]ses [facility]  Call when ready to pickup Initial call taken by: Rudy Jew, RN,  February 03, 2010 8:24 AM  Follow-up for Phone Call        OK to give a 30 day supply, same sig 1 patch topically every 72 hours, Disp:P 10 Follow-up by: Danise Edge MD,  February 03, 2010 2:16 PM    Prescriptions: DURAGESIC-50 50 MCG/HR PT72 (FENTANYL) 1 patch every 3 days for pain fill on July 19  #10 x 0   Entered by:   Josph Macho RMA   Authorized by:   Danise Edge MD   Signed by:   Josph Macho RMA on 02/03/2010   Method used:   Telephoned to ...       [facility] [phone][facility] (retail)       [address]       [place], [place]  [address]       Ph: [phone] or [phone]       Fax: [phone]   RxID:   [id number]   Appended Document: fentanyl Medications Added DURAGESIC-50 50 MCG/HR PT72 (FENTANYL) 1 patch every 3 days for pain       Pt called and stated this RX needs to be hand written. I verified with Pharmacy. Will reprint RX and notify pt that RX is available. Pt was upset because other MD wrote RX for 3 months. I tried to explain to pt that Dr Abner Greenspan is covering for the other MD and is only doing controlled substances for 1 month at a time.   Clinical Lists Changes  Medications: Changed medication from DURAGESIC-50 50 MCG/HR PT72 (FENTANYL) 1 patch every 3 days for pain fill on July 19 to DURAGESIC-50 50 MCG/HR PT72 (FENTANYL) 1 patch every 3 days for pain - Signed Rx of DURAGESIC-50 50 MCG/HR PT72 (FENTANYL) 1 patch every 3 days for pain;  #10 x 0;  Signed;  Entered by: Josph Macho RMA;  Authorized by: Danise Edge MD;  Method used: Print then Give to Patient    Prescriptions: DURAGESIC-50 50 MCG/HR PT72 (FENTANYL) 1  patch every 3 days for pain  #10 x 0   Entered by:   Josph Macho RMA   Authorized by:   Danise Edge MD   Signed by:   Josph Macho RMA on 02/10/2010   Method used:   Print then Give to Patient   RxID:   [id number]

## 2010-06-17 NOTE — Progress Notes (Signed)
Summary: come off coumadin before 8/11   Phone Note From Other Clinic   Caller: amanda office [phone] Request: Talk with Nurse Details for Reason: pt having procedure on 8/11. pt need to come off coumadin by 8/6. office would like a call back today.  Initial call taken by: Lorne Skeens,  December 18, 2009 12:57 PM  Follow-up for Phone Call        Per Marchelle Folks  patient has rescheduled colonoscopy.  Mylo Red RN

## 2010-06-17 NOTE — Medication Information (Signed)
Summary: rov/kb  Anticoagulant Therapy  Managed by: Elaina Pattee, PharmD Referring MD: Olga Millers MD PCP: Judithann Sheen MD Supervising MD: Excell Seltzer MD, Casimiro Needle Indication 1: Atrial Fibrillation (ICD-427.31) Indication 2: Atrial Flutter (ICD-427.32) Lab Used: [facility] Site: [facility] INR POC 3.3 INR RANGE 2 - 3  Dietary changes: no    Health status changes: no    Bleeding/hemorrhagic complications: no    Recent/future hospitalizations: yes       Details: Will be having colonoscopy in July.  Will let us know after appointment.  Any changes in medication regimen? no    Recent/future dental: no  Any missed doses?: no       Is patient compliant with meds? yes       Allergies: No Known Drug Allergies  Anticoagulation Management History:      The patient is taking warfarin and comes in today for a routine follow up visit.  Positive risk factors for bleeding include an age of 75 years or older and presence of serious comorbidities.  The bleeding index is 'intermediate risk'.  Positive CHADS2 values include History of Diabetes.  Negative CHADS2 values include Age > 52 years old.  The start date was 07/16/2007.  Her last INR was 3.0 ratio.  Anticoagulation responsible provider: Excell Seltzer MD, Casimiro Needle.  INR POC: 3.3.  Cuvette Lot#: [id number].  Exp: 12/2010.    Anticoagulation Management Assessment/Plan:      The patient's current anticoagulation dose is Warfarin sodium 5 mg tabs: Take as directed by coumadin clinic..  The target INR is 2.0-3.0.  The next INR is due 10/30/2009.  Anticoagulation instructions were given to patient.  Results were reviewed/authorized by Elaina Pattee, PharmD.  She was notified by Elaina Pattee, PharmD.         Prior Anticoagulation Instructions: INR-2.5 Change dosing schedule, Take 1 tablet on Sunday and Thursday and 1/2 a tablet on all other days of each week. Return in 2 weeks.   Current Anticoagulation Instructions: INR 3.3. Hold Coumadin  today, then take 0.5 tablet daily except 1 tablet on Sundays. Recheck in 2 weeks.

## 2010-06-17 NOTE — Letter (Signed)
Summary: Diabetic Instructions  [facility]  [address] [place], [place] [address]   Phone: [phone]  Fax: [phone]    [name] [birth year] MRN: [id number]   _ x _   ORAL DIABETIC MEDICATION INSTRUCTIONS         (Glimepiride, Metformin) The day before your procedure:   Take your diabetic pill as you do normally  The day of your procedure:   Do not take your diabetic pill    We will check your blood sugar levels during the admission process and again in Recovery before discharging you home

## 2010-06-17 NOTE — Progress Notes (Signed)
Summary: discuss ov on yesterday- meds   Call back at Home Phone [phone]   Caller: [phone] Reason for Call: Talk to Nurse Details for Reason: discuss office visit on yesterday  concerning meds.  Initial call taken by: Lorne Skeens,  March 16, 2010 11:24 AM    Additional Follow-up for Phone Call Additional follow up Details #2::    explained the med change due to verapamil. pt son expressed understanding.  Follow-up by: Claris Gladden RN,  March 16, 2010 11:34 AM

## 2010-06-17 NOTE — Assessment & Plan Note (Signed)
Summary: med check/refill/pt coming in fasting/cjr   Vital Signs:  Patient profile:   77 year old female Weight:      144 pounds O2 Sat:      90 % Temp:     97.8 degrees F Pulse rate:   74 / minute BP sitting:   130 / 70  (left arm)  Vitals Entered By: Pura Spice, RN (Oct 01, 2009 9:43 AM) CC: questions about meds fasting for lipids and hgb a 1 c  need refill glimeride diazepam temazepam  duragesic  Is Patient Diabetic? Yes Did you bring your meter with you today? No Research Study Name: FBS 80   History of Present Illness: This 77 year old white married female known diabetic who is well controlled he is in to discuss her medical problems and refill her medications. He relates he has continuous chronic back pain and needs analgesics on a regular basis for relief Review of her CBGs on the past 3 weeks and were excellent CBGs today was 80 She needs a colonoscopic examination and has canceled 2 previous pulmonologist but promises she will have it done now and we will schedule another appointment She continues to be depressed and had chest needs to continue her medications. History of atrial fibrillation under the care of Dr. Graciela Husbands and is on Coumadin therapy along with metoprolol  Past problem with vision since she has macular degeneration She is fasting so will get laboratory studies  Allergies (verified): No Known Drug Allergies  Past History:  Past Medical History: Last updated: 06/11/2009 arthritis htn hyperlipideminaia Diabetes mellitus, type I Paroxysmal atrial fibrillation Fatigue Incomplete left bundle-branch block with left axis deviation Chronic back pain Mitral valve prolapse Macular degeneration Esophageal Stricture Gastritis Diverticulosis hyperplastic colon polyps  03/2004 Hemorrhoids  Past Surgical History: Last updated: 07/29/2009 Right ankle surgery  Social History: Last updated: 07/29/2009 Retired --[facility] Married    Tobacco Use - Yes.  1/2 ppd.  started at age 66.   Alcohol Use - yes-Daily  Risk Factors: Smoking Status: current (04/29/2009) Packs/Day: 0.5 (04/29/2009)  Review of Systems  The patient denies anorexia, fever, weight loss, weight gain, vision loss, decreased hearing, hoarseness, chest pain, syncope, dyspnea on exertion, peripheral edema, prolonged cough, headaches, hemoptysis, abdominal pain, melena, hematochezia, severe indigestion/heartburn, hematuria, incontinence, genital sores, muscle weakness, suspicious skin lesions, transient blindness, difficulty walking, depression, unusual weight change, abnormal bleeding, enlarged lymph nodes, angioedema, breast masses, and testicular masses.    Physical Exam  General:  Well-developed,well-nourished,in no acute distress; alert,appropriate and cooperative throughout examination Head:  Normocephalic and atraumatic without obvious abnormalities. No apparent alopecia or balding. Eyes:  decreased vision otherwise no positive findings Ears:  External ear exam shows no significant lesions or deformities.  Otoscopic examination reveals clear canals, tympanic membranes are intact bilaterally without bulging, retraction, inflammation or discharge. Hearing is grossly normal bilaterally. Nose:  External nasal examination shows no deformity or inflammation. Nasal mucosa are pink and moist without lesions or exudates. Mouth:  Oral mucosa and oropharynx without lesions or exudates.  Teeth in good repair. Neck:  No deformities, masses, or tenderness noted. Chest Wall:  No deformities, masses, or tenderness noted. Breasts:  No mass, nodules, thickening, tenderness, bulging, retraction, inflamation, nipple discharge or skin changes noted.   Lungs:  Normal respiratory effort, chest expands symmetrically. Lungs are clear to auscultation, no crackles or wheezes. Heart:  Normal rate and regular rhythm. S1 and S2 normal without gallop, murmur, click, rub or other  extra sounds.  Abdomen:  Bowel sounds positive,abdomen soft and non-tender without masses, organomegaly or hernias noted. Rectal:  not examined Genitalia:  none exam Msk:  swollen tender painful arthritic knees and ankle pain of lumbar spine on palpation as well as flexion Pulses:  R and L carotid,radial,femoral,dorsalis pedis and posterior tibial pulses are full and equal bilaterally Extremities:  No clubbing, cyanosis, edema, or deformity noted with normal full range of motion of all joints.   Neurologic:  No cranial nerve deficits noted. Station and gait are normal. Plantar reflexes are down-going bilaterally. DTRs are symmetrical throughout. Sensory, motor and coordinative functions appear intact. Skin:  Intact without suspicious lesions or rashes Cervical Nodes:  No lymphadenopathy noted Axillary Nodes:  No palpable lymphadenopathy Inguinal Nodes:  No significant adenopathy Psych:  Cognition and judgment appear intact. Alert and cooperative with normal attention span and concentration. No apparent delusions, illusions, hallucinations   Impression & Recommendations:  Problem # 1:  COLONIC POLYPS (ICD-211.3) Assessment Unchanged  Orders: Gastroenterology Referral (GI)schedule colonoscopic exam  Problem # 2:  COPD (ICD-496) Assessment: Unchanged  Her updated medication list for this problem includes:    Proair Hfa 108 (90 Base) Mcg/act Aers (Albuterol sulfate) .Marland Kitchen... 1-2 puffs every 4-6 hours as needed    Combivent 18-103 Mcg/act Aero (Ipratropium-albuterol) ..... Inhale 2 puffs four times a day  Problem # 3:  DIABETIC PERIPHERAL NEUROPATHY (ICD-250.60) Assessment: Unchanged  Her updated medication list for this problem includes:    Glimepiride 4 Mg Tabs (Glimepiride) .Marland Kitchen... Take 1/2 tab by mouth two times a day    Metformin Hcl 500 Mg Tabs (Metformin hcl) .Marland Kitchen... 1 bid  Problem # 4:  LOW BACK PAIN, CHRONIC (ICD-724.2) Assessment: Deteriorated  Her updated medication list for  this problem includes:    Duragesic-50 50 Mcg/hr Pt72 (Fentanyl) .Marland Kitchen... Take one patch every 3 days fill for   19 may    Hydrocodone-acetaminophen 10-650 Mg Tabs (Hydrocodone-acetaminophen) .Marland Kitchen... 1 by mouth every 4-6 hrs as needed pain. not to exceed 4 per day.    Duragesic-50 50 Mcg/hr Pt72 (Fentanyl) .Marland Kitchen... 1 patch every 3 days fill june 19    Duragesic-50 50 Mcg/hr Pt72 (Fentanyl) .Marland Kitchen... 1 patch every 3 days for pain fill on july 19    Oxycodone-acetaminophen 10-650 Mg Tabs (Oxycodone-acetaminophen) .Marland Kitchen... 1 q4h as needed pain not over 4 per day  Problem # 5:  DIABETES MELLITUS, TYPE II, UNCONTROLLED (ICD-250.02) Assessment: Improved  Her updated medication list for this problem includes:    Glimepiride 4 Mg Tabs (Glimepiride) .Marland Kitchen... Take 1/2 tab by mouth two times a day    Metformin Hcl 500 Mg Tabs (Metformin hcl) .Marland Kitchen... 1 bid  Orders: TLB-A1C / Hgb A1C (Glycohemoglobin) (83036-A1C) TLB-Microalbumin/Creat Ratio, Urine (82043-MALB)  Problem # 6:  ATRIAL FIBRILLATION (ICD-427.31) Assessment: Improved  Her updated medication list for this problem includes:    Verapamil Hcl Cr 180 Mg Cr-tabs (Verapamil hcl) .Marland Kitchen... Take 1 tablet by mouth two times a day    Warfarin Sodium 5 Mg Tabs (Warfarin sodium) .Marland Kitchen... Take as directed by coumadin clinic.    Metoprolol Tartrate 25 Mg Tabs (Metoprolol tartrate) .Marland Kitchen... Take 1/2 tablet two times a day  Problem # 7:  DISC DISEASE, LUMBOSACRAL SPINE (ICD-722.52) Assessment: Unchanged Fentnyl and oxycodone  Problem # 8:  ATRIAL FIBRILLATION (ICD-427.31) Assessment: Improved  Problem # 9:  ANXIETY DEPRESSION (ICD-300.4) Assessment: Unchanged  Orders: Prescription Created Electronically [phone])  Problem # 10:  HYPERLIPIDEMIA (ICD-272.4)  Her updated medication list for this problem includes:  Simvastatin 80 Mg Tabs (Simvastatin) .Marland Kitchen... 1 hs  Orders: TLB-Lipid Panel (80061-LIPID) TLB-Hepatic/Liver Function Pnl (80076-HEPATIC)  Complete Medication  List: 1)  Verapamil Hcl Cr 180 Mg Cr-tabs (Verapamil hcl) .... Take 1 tablet by mouth two times a day 2)  Temazepam 30 Mg Caps (Temazepam) .Marland Kitchen.. 1 capsule at bedtime 3)  Glimepiride 4 Mg Tabs (Glimepiride) .... Take 1/2 tab by mouth two times a day 4)  Paxil 40 Mg Tabs (Paroxetine hcl) .Marland Kitchen.. 1 by mouth once daily 5)  Duragesic-50 50 Mcg/hr Pt72 (Fentanyl) .... Take one patch every 3 days fill for   19 may 6)  Simvastatin 80 Mg Tabs (Simvastatin) .Marland Kitchen.. 1 hs 7)  Warfarin Sodium 5 Mg Tabs (Warfarin sodium) .... Take as directed by coumadin clinic. 8)  Metoprolol Tartrate 25 Mg Tabs (Metoprolol tartrate) .... Take 1/2 tablet two times a day 9)  Diazepam 5 Mg Tabs (Diazepam) .Marland Kitchen.. 1 by mouth four times a day as needed stress 10)  Hydrocodone-acetaminophen 10-650 Mg Tabs (Hydrocodone-acetaminophen) .Marland Kitchen.. 1 by mouth every 4-6 hrs as needed pain. not to exceed 4 per day. 11)  One Touch Ultra Strips  .... Test in am 12)  Metformin Hcl 500 Mg Tabs (Metformin hcl) .Marland Kitchen.. 1 bid 13)  Proair Hfa 108 (90 Base) Mcg/act Aers (Albuterol sulfate) .Marland Kitchen.. 1-2 puffs every 4-6 hours as needed 14)  Combivent 18-103 Mcg/act Aero (Ipratropium-albuterol) .... Inhale 2 puffs four times a day 15)  Duragesic-50 50 Mcg/hr Pt72 (Fentanyl) .Marland Kitchen.. 1 patch every 3 days fill june 19 16)  Duragesic-50 50 Mcg/hr Pt72 (Fentanyl) .Marland Kitchen.. 1 patch every 3 days for pain fill on july 19 17)  Oxycodone-acetaminophen 10-650 Mg Tabs (Oxycodone-acetaminophen) .Marland Kitchen.. 1 q4h as needed pain not over 4 per day  Other Orders: Venipuncture (42595) TLB-CK Total Only(Creatine Kinase/CPK) (82550-CK)  Patient Instructions: 1)  Continue Fentanyl 2)  add  oxucodone 1 tab q4h as needed pain , take in addition to  3)  Fentanyl 4)  try sennocot or store brand 2-4  tabs bedtine for constipation 5)  if that does not work then try Miralax whic ids OTC 6)  call Lab  results 7)  continue other medications as prescribed 8)  Please with your well-controlled  diabetes Prescriptions: GLIMEPIRIDE 4 MG TABS (GLIMEPIRIDE) take 1/2 tab by mouth two times a day  #30 x 11   Entered by:   Pura Spice, RN   Authorized by:   Judithann Sheen MD   Signed by:   Pura Spice, RN on 10/13/2009   Method used:   Electronically to        [facility] (retail)       [address]       [place], [place]  [address]       Ph: [phone]       Fax: [phone]   RxID:   [id number] OXYCODONE-ACETAMINOPHEN 10-650 MG TABS (OXYCODONE-ACETAMINOPHEN) 1 q4h as needed pain not over 4 per day  #120 x 0   Entered and Authorized by:   Judithann Sheen MD   Signed by:   Judithann Sheen MD on 10/01/2009   Method used:   Print then Give to Patient   RxID:   [id number] DURAGESIC-50 50 MCG/HR PT72 (FENTANYL) 1 patch every 3 days for pain fill on July 19  #10 x 0   Entered and Authorized by:   Valarie Merino  Montez Hageman MD   Signed by:   Judithann Sheen MD on 10/01/2009   Method used:   Print then Give to Patient   RxID:   [id number] DURAGESIC-50 50 MCG/HR PT72 (FENTANYL) 1 patch every 3 days fill June 19  #10 x 0   Entered and Authorized by:   Judithann Sheen MD   Signed by:   Judithann Sheen MD on 10/01/2009   Method used:   Print then Give to Patient   RxID:   [id number] DURAGESIC-50 50 MCG/HR  PT72 (FENTANYL) take one patch every 3 days fill for   19 May  #10 x 0   Entered and Authorized by:   Judithann Sheen MD   Signed by:   Judithann Sheen MD on 10/01/2009   Method used:   Print then Give to Patient   RxID:   [id number] DIAZEPAM 5 MG TABS (DIAZEPAM) 1 by mouth four times a day as needed stress  #120 x 5   Entered and Authorized by:   Judithann Sheen MD   Signed by:   Judithann Sheen MD on 10/01/2009   Method used:   Print then Give to Patient   RxID:   [id number] TEMAZEPAM 30 MG  CAPS (TEMAZEPAM) 1 capsule at bedtime  #30 x 5   Entered  and Authorized by:   Judithann Sheen MD   Signed by:   Judithann Sheen MD on 10/01/2009   Method used:   Print then Give to Patient   RxID:   [id number]

## 2010-06-17 NOTE — Assessment & Plan Note (Signed)
Summary: 1 1/2 weeks fup dr//ccm   Vital Signs:  Patient profile:   77 year old female Weight:      149 pounds Pulse rate:   98 / minute Pulse rhythm:   regular BP sitting:   142 / 84  (left arm) Cuff size:   regular  Vitals Entered By: Kyung Rudd, CMA (June 09, 2010 11:27 AM) CC: 1 1/2wk F/U breat hematoma   Primary Care Provider:  Dianna Limbo, MD  CC:  1 1/2wk F/U breat hematoma.  History of Present Illness:  patient presents to clinic for followup of left breast hematoma. notes slight decrease in size of  breast knot. has had extensive bruising but denies pain the breast and shoulder. Has had no further fall or accident. Denies gross active bleeding from Coumadin. requests refill of Duragesic for  chronic low back pain.  Current Medications (verified): 1)  Verapamil Hcl Cr 180 Mg Cr-Tabs (Verapamil Hcl) .... Take 1 Tablet By Mouth Two Times A Day 2)  Temazepam 30 Mg  Caps (Temazepam) .Marland Kitchen.. 1 Capsule At Bedtime 3)  Glimepiride 4 Mg Tabs (Glimepiride) .... Take 1/2 Tab By Mouth Two Times A Day 4)  Paxil 40 Mg  Tabs (Paroxetine Hcl) .Marland Kitchen.. 1 By Mouth Once Daily 5)  Warfarin Sodium 5 Mg Tabs (Warfarin Sodium) .... Take As Directed By Coumadin Clinic. 6)  Metoprolol Tartrate 25 Mg Tabs (Metoprolol Tartrate) .... Take 1/2 Tablet Two Times A Day 7)  Diazepam 5 Mg Tabs (Diazepam) .Marland Kitchen.. 1 By Mouth Four Times A Day As Needed Stress 8)  Hydrocodone-Acetaminophen 10-650 Mg Tabs (Hydrocodone-Acetaminophen) .Marland Kitchen.. 1 By Mouth Every 4-6 Hrs As Needed Pain. Not To Exceed 4 Per Day. 9)  One Touch Ultra Strips .... Test in Am 10)  Metformin Hcl 500 Mg Tabs (Metformin Hcl) .Marland Kitchen.. 1 Bid 11)  Duragesic-50 50 Mcg/hr Pt72 (Fentanyl) .Marland Kitchen.. 1 Patch Every 3 Days For Pain 12)  Altace 5 Mg Caps (Ramipril) .... Take 1 Capsule By Mouth Once A Day 13)  Vitamin D 1000 Unit Tabs (Cholecalciferol) .... Take 1 Tablet By Mouth Once A Day 14)  Align  Caps (Probiotic Product) .... One Capsule By Mouth Once  Daily X 1 Month 15)  Metamucil Multihealth Fiber 58.6 % Powd (Psyllium) .... Once Daily 16)  Pravastatin Sodium 80 Mg Tabs (Pravastatin Sodium) .... Once Daily  Allergies (verified): 1)  Codeine  Past History:  Past medical, surgical, family and social histories (including risk factors) reviewed for relevance to current acute and chronic problems.  Past Medical History: Reviewed history from 03/12/2010 and no changes required. Arthritis HTN Hyperlipideminaia Diabetes mellitus, type I Paroxysmal atrial fibrillation Fatigue Incomplete left bundle-branch block with left axis deviation Chronic back pain-narcotic dependence Lumbar disc disease Mitral valve prolapse Macular degeneration Esophageal Stricture Gastritis, RUT negative, 2005 Diverticulosis Hyperplastic colon polyps  03/2004 Hemorrhoids Alcohol use-dependence  Past Surgical History: Reviewed history from 12/14/2009 and no changes required. Right ankle surgery Knee Arthroscopy--right  Family History: Reviewed history from 03/12/2010 and no changes required. Mother died at age 59, heart disease  father died at age 59, throat cancer.  She does have a sister that is alive and also has atrial  fibrillation.   Social History: Reviewed history from 12/14/2009 and no changes required. Retired --[facility] Married, 1 girl Tobacco Use - Yes.  1/2 ppd.  started at age 33.   Alcohol Use - yes-Daily Daily Caffeine Use 2 cups of 1/2 caf Illicit Drug Use - no  Review of Systems General:  Denies chills and fever. GI:  Denies bloody stools, dark tarry stools, and vomiting blood. MS:  Complains of joint pain and low back pain. Derm:  Complains of changes in color of skin; denies poor wound healing and rash. Neuro:  Denies disturbances in coordination and falling down.  Physical Exam  General:  Well-developed,well-nourished,in no acute distress; alert,appropriate and cooperative throughout  examination Head:  Normocephalic and atraumatic without obvious abnormalities. No apparent alopecia or balding. Eyes:  pupils equal, pupils round, corneas and lenses clear, and no injection.   Ears:  no external deformities.   Nose:  no external deformity.   Breasts:   with female nurse escort exam is performed. significant diffuse ecchymosis involving left breast. continues to have subcutaneous soft tissue mass now firm approximately 4 cm in transverse diameter. Nontender.   Impression & Recommendations:  Problem # 1:  BREAST MASS, LEFT (ICD-611.72) Assessment Improved  mild improvement. Continue monitoring of the area. No gross active bleeding. instructed patient likely represents traumatic hematoma with chronic anticoagulation use. Should resolve over the  Nexavar weeks. Followup if no improvement or worsening.  Problem # 2:  LOW BACK PAIN, CHRONIC (ICD-724.2) Assessment: Unchanged  stable. Refilled Duragesic. Her updated medication list for this problem includes:    Hydrocodone-acetaminophen 10-650 Mg Tabs (Hydrocodone-acetaminophen) .Marland Kitchen... 1 by mouth every 4-6 hrs as needed pain. not to exceed 4 per day.    Duragesic-50 50 Mcg/hr Pt72 (Fentanyl) .Marland Kitchen... 1 patch every 3 days for pain  Complete Medication List: 1)  Verapamil Hcl Cr 180 Mg Cr-tabs (Verapamil hcl) .... Take 1 tablet by mouth two times a day 2)  Temazepam 30 Mg Caps (Temazepam) .Marland Kitchen.. 1 capsule at bedtime 3)  Glimepiride 4 Mg Tabs (Glimepiride) .... Take 1/2 tab by mouth two times a day 4)  Paxil 40 Mg Tabs (Paroxetine hcl) .Marland Kitchen.. 1 by mouth once daily 5)  Warfarin Sodium 5 Mg Tabs (Warfarin sodium) .... Take as directed by coumadin clinic. 6)  Metoprolol Tartrate 25 Mg Tabs (Metoprolol tartrate) .... Take 1/2 tablet two times a day 7)  Diazepam 5 Mg Tabs (Diazepam) .Marland Kitchen.. 1 by mouth four times a day as needed stress 8)  Hydrocodone-acetaminophen 10-650 Mg Tabs (Hydrocodone-acetaminophen) .Marland Kitchen.. 1 by mouth every 4-6 hrs as needed  pain. not to exceed 4 per day. 9)  One Touch Ultra Strips  .... Test in am 10)  Metformin Hcl 500 Mg Tabs (Metformin hcl) .Marland Kitchen.. 1 bid 11)  Duragesic-50 50 Mcg/hr Pt72 (Fentanyl) .Marland Kitchen.. 1 patch every 3 days for pain 12)  Altace 5 Mg Caps (Ramipril) .... Take 1 capsule by mouth once a day 13)  Vitamin D 1000 Unit Tabs (Cholecalciferol) .... Take 1 tablet by mouth once a day 14)  Align Caps (Probiotic product) .... One capsule by mouth once daily x 1 month 15)  Metamucil Multihealth Fiber 58.6 % Powd (Psyllium) .... Once daily 16)  Pravastatin Sodium 80 Mg Tabs (Pravastatin sodium) .... Once daily  Patient Instructions: 1)  Please continue to check your left breast as you are doing. The bruising and knot should slowly improve over the next several weeks. Prescriptions: DURAGESIC-50 50 MCG/HR PT72 (FENTANYL) 1 patch every 3 days for pain  #10 x 0   Entered and Authorized by:   Edwyna Perfect MD   Signed by:   Edwyna Perfect MD on 06/09/2010   Method used:   Print then Give to Patient   RxID:   [id number]  Orders Added: 1)  Est. Patient Level III [81859]

## 2010-06-17 NOTE — Procedures (Signed)
Summary: colonoscopy  Victorino Dike)   Colonoscopy  Procedure date:  03/30/2004  Findings:      Results: Hemorrhoids.     Results: Diverticulosis.       Location:  [facility].    Comments:      Repeat colonoscopy in 3 years.    Colonoscopy  Procedure date:  03/30/2004  Findings:      Results: Hemorrhoids.     Results: Diverticulosis.       Location:  [facility].    Comments:      Repeat colonoscopy in 3 years.    Patient Name: [name], [name]. MRN:  Procedure Procedures: Colonoscopy CPT: [phone].  Personnel: Endoscopist: Ulyess Mort, MD.  Referred By: Dianna Limbo, MD.  Exam Location: Exam performed in Outpatient Clinic. Outpatient  Patient Consent: Procedure, Alternatives, Risks and Benefits discussed, consent obtained, from patient. Consent was obtained by the RN.  Indications  Average Risk Screening Routine.  Exam Exam: Extent of exam reached: Cecum, extent intended: Cecum.  The cecum was identified by appendiceal orifice and IC valve. Images were not taken. ASA Classification: II. Tolerance: good.  Monitoring: Pulse and BP monitoring, Oximetry used. Supplemental O2 given.  Colon Prep Prep results: fair, exam compromised.  Sedation Meds: Patient assessed and found to be appropriate for moderate (conscious) sedation. Fentanyl 150 mcg. given IV. Versed 12  Findings - DIVERTICULOSIS: Descending Colon to Sigmoid Colon. ICD9: Diverticulosis: 562.10. Comments: minnimal.  POLYP: Descending Colon, Maximum size: 7 mm. sessile polyp. Procedure:  snare with cautery, removed, retrieved, Polyp sent to pathology.  POLYP: Sigmoid Colon, Maximum size: 5 mm. sessile polyp. Procedure:  snare with cautery, removed, retrieved, sent to pathology.  POLYP: Sigmoid Colon, Maximum size: 3 mm. sessile polyp. Procedure:  snare with cautery, removed, retrieved, sent to pathology.  POLYP: Sigmoid Colon, Maximum size: 4 mm. sessile polyp.  Procedure:  snare with cautery, removed, not retrieved,  POLYP: Sigmoid Colon, Maximum size: 12 mm. sessile polyp. Procedure:  snare with cautery, removed, retrieved, sent to pathology.  - HEMORRHOIDS: External. Size: Grade I. ICD9: Hemorrhoids, External: 455.3.   Assessment Abnormal examination, see findings above.  Diagnoses: 562.10: Diverticulosis.  455.3: Hemorrhoids, External.   Events  Unplanned Interventions: No intervention was required.  Unplanned Events: There were no complications. Plans Medication Plan: Await pathology. Continue current medications.  Patient Education: Patient given standard instructions for: Polyps. Diverticulosis. Hemorrhoids. Yearly hemoccult testing recommended. Patient instructed to get routine colonoscopy every 3 years.  Disposition: After procedure patient sent to recovery. After recovery patient sent home.  This report was created from the original endoscopy report, which was reviewed and signed by the above listed endoscopist.    cc. London Sheer

## 2010-06-17 NOTE — Progress Notes (Signed)
Summary: needs appt   ---- Converted from flag ---- ---- 09/02/2009 9:51 AM, Debbra Riding wrote: I called the pt and spoke with her about scheduling an appt for f/u on her diabetes.... Pt adv that she was still in the bed and didn't have her calendar near her so she will have to call back to schedule appt... Pt was given my name and ext to call so that appt can be scheduled.  Thanks,  Rhonda   ---- 09/01/2009 5:11 PM, Pura Spice, RN wrote: Elvina Sidle would you call and sch Avenly "judi" Danker office visit for her diabetes thanks ------------------------------

## 2010-06-17 NOTE — Progress Notes (Signed)
Summary: Pt cx ov for this morning. Wants to know if she'll be charged  Phone Note Call from Patient Call back at Home Phone [phone]   Caller: Patient Summary of Call: Pt cx appt for today 03/12/10 at 11am, and has rsc for next Wednesday 03/17/10. Pt is wondering is she is going to be charged a late cx fee or not? Pls advise.   Initial call taken by: Lucy Antigua,  March 12, 2010 9:04 AM  Follow-up for Phone Call        no late fee Follow-up by: Danise Edge MD,  March 12, 2010 11:47 AM  Additional Follow-up for Phone Call Additional follow up Details #1::        Pt has been notified.  Additional Follow-up by: Lucy Antigua,  March 12, 2010 3:13 PM

## 2010-06-18 ENCOUNTER — Encounter (INDEPENDENT_AMBULATORY_CARE_PROVIDER_SITE_OTHER): Payer: Medicare Other

## 2010-06-18 ENCOUNTER — Ambulatory Visit: Admit: 2010-06-18 | Payer: Self-pay

## 2010-06-18 ENCOUNTER — Encounter: Payer: Self-pay | Admitting: Cardiology

## 2010-06-18 DIAGNOSIS — I4892 Unspecified atrial flutter: Secondary | ICD-10-CM

## 2010-06-18 DIAGNOSIS — I4891 Unspecified atrial fibrillation: Secondary | ICD-10-CM

## 2010-06-18 LAB — CONVERTED CEMR LAB: POC INR: 2.2

## 2010-06-23 NOTE — Medication Information (Signed)
Summary: rov/ewj   Anticoagulant Therapy  Managed by: Toni Reams, RN, BSN Referring MD: Olga Millers MD PCP: Dianna Limbo, MD Supervising MD: Jens Som MD, Arlys John Indication 1: Atrial Fibrillation (ICD-427.31) Indication 2: Atrial Flutter (ICD-427.32) Lab Used: [facility] Site: [facility] INR POC 2.2 INR RANGE 2 - 3  Dietary changes: no    Health status changes: no    Bleeding/hemorrhagic complications: no    Recent/future hospitalizations: no    Any changes in medication regimen? no    Recent/future dental: no  Any missed doses?: no       Is patient compliant with meds? yes       Allergies: 1)  Codeine  Anticoagulation Management History:      The patient is taking warfarin and comes in today for a routine follow up visit.  Positive risk factors for bleeding include an age of 47 years or older and presence of serious comorbidities.  The bleeding index is 'intermediate risk'.  Positive CHADS2 values include Age > 10 years old and History of Diabetes.  The start date was 07/16/2007.  Her last INR was 3.0 ratio.  Anticoagulation responsible provider: Jens Som MD, Arlys John.  INR POC: 2.2.  Cuvette Lot#: [id number].  Exp: 05/2011.    Anticoagulation Management Assessment/Plan:      The patient's current anticoagulation dose is Warfarin sodium 5 mg tabs: Take as directed by coumadin clinic..  The target INR is 2.0-3.0.  The next INR is due 07/16/2010.  Anticoagulation instructions were given to patient.  Results were reviewed/authorized by Toni Reams, RN, BSN.  She was notified by [name] PharmD.         Prior Anticoagulation Instructions: INR 3.6 (goal INR: 2-3)  Skip tomorrow's(Thursday) dose.  Resume 1/2 tablet everyday except 1 tablet on Mondays and Fridays. Recheck in 3 weeks.  Current Anticoagulation Instructions: INR 2.2  Continue on same dosage 1/2 tablet daily except 1 tablet on Mondays and Fridays.  Recheck in 4 weeks.

## 2010-07-05 ENCOUNTER — Other Ambulatory Visit: Payer: Self-pay | Admitting: Family Medicine

## 2010-07-06 DIAGNOSIS — I4891 Unspecified atrial fibrillation: Secondary | ICD-10-CM

## 2010-07-19 ENCOUNTER — Encounter: Payer: Self-pay | Admitting: Cardiology

## 2010-07-19 ENCOUNTER — Encounter (INDEPENDENT_AMBULATORY_CARE_PROVIDER_SITE_OTHER): Payer: Medicare Other

## 2010-07-19 DIAGNOSIS — Z7901 Long term (current) use of anticoagulants: Secondary | ICD-10-CM

## 2010-07-19 DIAGNOSIS — I4891 Unspecified atrial fibrillation: Secondary | ICD-10-CM

## 2010-07-19 DIAGNOSIS — I4892 Unspecified atrial flutter: Secondary | ICD-10-CM

## 2010-07-19 LAB — CONVERTED CEMR LAB: POC INR: 3.2

## 2010-07-26 ENCOUNTER — Telehealth: Payer: Self-pay | Admitting: Family Medicine

## 2010-07-26 NOTE — Telephone Encounter (Signed)
Pt called and is starting to have problems with Incontinence. Pt is req a recommendation of otc med to take for problem or a script. Pt uses [facility] on [address].

## 2010-07-27 NOTE — Telephone Encounter (Signed)
help

## 2010-07-27 NOTE — Medication Information (Signed)
Summary: rov/tm   Anticoagulant Therapy  Managed by: Windell Hummingbird, RN Referring MD: Olga Millers MD PCP: Dianna Limbo, MD Supervising MD: Daleen Squibb MD, Maisie Fus Indication 1: Atrial Fibrillation (ICD-427.31) Indication 2: Atrial Flutter (ICD-427.32) Lab Used: [facility] Site: [facility] INR POC 3.2 INR RANGE 2 - 3  Dietary changes: no    Health status changes: no    Bleeding/hemorrhagic complications: no    Recent/future hospitalizations: no    Any changes in medication regimen? no    Recent/future dental: no  Any missed doses?: no       Is patient compliant with meds? yes       Allergies: 1)  Codeine  Anticoagulation Management History:      The patient is taking warfarin and comes in today for a routine follow up visit.  Positive risk factors for bleeding include an age of 75 years or older and presence of serious comorbidities.  The bleeding index is 'intermediate risk'.  Positive CHADS2 values include Age > 60 years old and History of Diabetes.  The start date was 07/16/2007.  Her last INR was 3.0 ratio.  Anticoagulation responsible provider: Daleen Squibb MD, Maisie Fus.  INR POC: 3.2.  Cuvette Lot#: [id number].  Exp: 05/2011.    Anticoagulation Management Assessment/Plan:      The patient's current anticoagulation dose is Warfarin sodium 5 mg tabs: Take as directed by coumadin clinic..  The target INR is 2.0-3.0.  The next INR is due 08/16/2010.  Anticoagulation instructions were given to patient.  Results were reviewed/authorized by Windell Hummingbird, RN.  She was notified by Windell Hummingbird, RN.         Prior Anticoagulation Instructions: INR 2.2  Continue on same dosage 1/2 tablet daily except 1 tablet on Mondays and Fridays.  Recheck in 4 weeks.    Current Anticoagulation Instructions: INR 3.2 Skip today's dose.  Continue taking 1/2 tablet every day, except take 1 tablet on Fridays and Mondays. Recheck in 4 weeks.

## 2010-07-28 LAB — GLUCOSE, CAPILLARY
Glucose-Capillary: 138 mg/dL — ABNORMAL HIGH (ref 70–99)
Glucose-Capillary: 165 mg/dL — ABNORMAL HIGH (ref 70–99)

## 2010-08-01 LAB — POCT I-STAT, CHEM 8
Calcium, Ion: 1.15 mmol/L (ref 1.12–1.32)
Chloride: 100 mEq/L (ref 96–112)
Glucose, Bld: 107 mg/dL — ABNORMAL HIGH (ref 70–99)
HCT: 45 % (ref 36.0–46.0)
Hemoglobin: 15.3 g/dL — ABNORMAL HIGH (ref 12.0–15.0)
TCO2: 32 mmol/L (ref 0–100)

## 2010-08-01 LAB — PROTIME-INR
INR: 2.79 — ABNORMAL HIGH (ref 0.00–1.49)
Prothrombin Time: 29.2 seconds — ABNORMAL HIGH (ref 11.6–15.2)

## 2010-08-01 LAB — CBC
Hemoglobin: 14.8 g/dL (ref 12.0–15.0)
RBC: 3.68 MIL/uL — ABNORMAL LOW (ref 3.87–5.11)
RDW: 13.3 % (ref 11.5–15.5)
WBC: 6.9 10*3/uL (ref 4.0–10.5)

## 2010-08-01 LAB — CARDIAC PANEL(CRET KIN+CKTOT+MB+TROPI)
Relative Index: INVALID (ref 0.0–2.5)
Total CK: 96 U/L (ref 7–177)
Troponin I: <0.01 ng/mL (ref 0.00–0.06)

## 2010-08-01 LAB — POCT CARDIAC MARKERS
CKMB, poc: <1 ng/mL — ABNORMAL LOW (ref 1.0–8.0)
Myoglobin, poc: 40.2 ng/mL (ref 12–200)
Troponin i, poc: <0.05 ng/mL (ref 0.00–0.09)
Troponin i, poc: <0.05 ng/mL (ref 0.00–0.09)

## 2010-08-01 LAB — LIPID PANEL
Cholesterol: 111 mg/dL (ref 0–200)
LDL Cholesterol: 41 mg/dL (ref 0–99)

## 2010-08-01 LAB — DIFFERENTIAL
Eosinophils Absolute: 0.1 10*3/uL (ref 0.0–0.7)
Eosinophils Relative: 2 % (ref 0–5)
Lymphs Abs: 1.4 10*3/uL (ref 0.7–4.0)
Monocytes Relative: 9 % (ref 3–12)

## 2010-08-01 LAB — BASIC METABOLIC PANEL
CO2: 30 mEq/L (ref 19–32)
Chloride: 103 mEq/L (ref 96–112)
GFR calc Af Amer: >60 mL/min (ref >60)
Sodium: 139 mEq/L (ref 135–145)

## 2010-08-01 LAB — GLUCOSE, CAPILLARY: Glucose-Capillary: 119 mg/dL — ABNORMAL HIGH (ref 70–99)

## 2010-08-01 LAB — HEMOGLOBIN A1C: Hgb A1c MFr Bld: 6.2 % — ABNORMAL HIGH (ref 4.6–6.1)

## 2010-08-01 LAB — TSH: TSH: 1.139 u[IU]/mL (ref 0.350–4.500)

## 2010-08-01 LAB — ETHANOL: Alcohol, Ethyl (B): <5 mg/dL (ref 0–10)

## 2010-08-03 LAB — HEMOGLOBIN A1C
Hgb A1c MFr Bld: 5.6 % (ref <5.7)
Mean Plasma Glucose: 114 mg/dL (ref <117)

## 2010-08-03 LAB — URINALYSIS, ROUTINE W REFLEX MICROSCOPIC
Glucose, UA: 100 mg/dL — AB
Hgb urine dipstick: NEGATIVE
Protein, ur: NEGATIVE mg/dL
Specific Gravity, Urine: 1.024 (ref 1.005–1.030)
Urobilinogen, UA: 0.2 mg/dL (ref 0.0–1.0)
pH: 5 (ref 5.0–8.0)
pH: 5.5 (ref 5.0–8.0)

## 2010-08-03 LAB — CBC
HCT: 40.1 % (ref 36.0–46.0)
Hemoglobin: 13.2 g/dL (ref 12.0–15.0)
Hemoglobin: 13.7 g/dL (ref 12.0–15.0)
Platelets: 198 10*3/uL (ref 150–400)
RBC: 3.54 MIL/uL — ABNORMAL LOW (ref 3.87–5.11)
WBC: 8.5 10*3/uL (ref 4.0–10.5)

## 2010-08-03 LAB — GLUCOSE, CAPILLARY
Glucose-Capillary: 190 mg/dL — ABNORMAL HIGH (ref 70–99)
Glucose-Capillary: 226 mg/dL — ABNORMAL HIGH (ref 70–99)
Glucose-Capillary: 228 mg/dL — ABNORMAL HIGH (ref 70–99)

## 2010-08-03 LAB — URINE MICROSCOPIC-ADD ON

## 2010-08-03 LAB — DIFFERENTIAL
Eosinophils Relative: 1 % (ref 0–5)
Lymphocytes Relative: 20 % (ref 12–46)
Lymphs Abs: 1.7 10*3/uL (ref 0.7–4.0)
Neutro Abs: 5.9 10*3/uL (ref 1.7–7.7)

## 2010-08-03 LAB — POCT I-STAT, CHEM 8
BUN: 11 mg/dL (ref 6–23)
Chloride: 101 mEq/L (ref 96–112)
Creatinine, Ser: 0.3 mg/dL — ABNORMAL LOW (ref 0.4–1.2)
Glucose, Bld: 127 mg/dL — ABNORMAL HIGH (ref 70–99)
HCT: 43 % (ref 36.0–46.0)
Potassium: 4 mEq/L (ref 3.5–5.1)

## 2010-08-03 LAB — COMPREHENSIVE METABOLIC PANEL
ALT: 12 U/L (ref 0–35)
Alkaline Phosphatase: 54 U/L (ref 39–117)
CO2: 28 mEq/L (ref 19–32)
GFR calc non Af Amer: >60 mL/min (ref >60)
Glucose, Bld: 166 mg/dL — ABNORMAL HIGH (ref 70–99)
Potassium: 3.8 mEq/L (ref 3.5–5.1)
Sodium: 136 mEq/L (ref 135–145)

## 2010-08-03 LAB — LIPID PANEL: VLDL: 27 mg/dL (ref 0–40)

## 2010-08-03 LAB — TSH: TSH: 2.09 u[IU]/mL (ref 0.350–4.500)

## 2010-08-03 LAB — APTT: aPTT: 42 seconds — ABNORMAL HIGH (ref 24–37)

## 2010-08-03 LAB — RAPID URINE DRUG SCREEN, HOSP PERFORMED
Opiates: POSITIVE — AB
Tetrahydrocannabinol: NOT DETECTED

## 2010-08-03 LAB — PROTIME-INR
INR: 1.59 — ABNORMAL HIGH (ref 0.00–1.49)
Prothrombin Time: 21.8 seconds — ABNORMAL HIGH (ref 11.6–15.2)
Prothrombin Time: 25.8 seconds — ABNORMAL HIGH (ref 11.6–15.2)

## 2010-08-09 LAB — GLUCOSE, CAPILLARY
Glucose-Capillary: 116 mg/dL — ABNORMAL HIGH (ref 70–99)
Glucose-Capillary: 121 mg/dL — ABNORMAL HIGH (ref 70–99)
Glucose-Capillary: 128 mg/dL — ABNORMAL HIGH (ref 70–99)
Glucose-Capillary: 131 mg/dL — ABNORMAL HIGH (ref 70–99)
Glucose-Capillary: 161 mg/dL — ABNORMAL HIGH (ref 70–99)
Glucose-Capillary: 187 mg/dL — ABNORMAL HIGH (ref 70–99)
Glucose-Capillary: 211 mg/dL — ABNORMAL HIGH (ref 70–99)
Glucose-Capillary: 213 mg/dL — ABNORMAL HIGH (ref 70–99)
Glucose-Capillary: 277 mg/dL — ABNORMAL HIGH (ref 70–99)
Glucose-Capillary: 285 mg/dL — ABNORMAL HIGH (ref 70–99)
Glucose-Capillary: 324 mg/dL — ABNORMAL HIGH (ref 70–99)
Glucose-Capillary: 341 mg/dL — ABNORMAL HIGH (ref 70–99)
Glucose-Capillary: 63 mg/dL — ABNORMAL LOW (ref 70–99)
Glucose-Capillary: 98 mg/dL (ref 70–99)

## 2010-08-09 LAB — CBC
HCT: 36.8 % (ref 36.0–46.0)
HCT: 37 % (ref 36.0–46.0)
Hemoglobin: 12.3 g/dL (ref 12.0–15.0)
MCHC: 33 g/dL (ref 30.0–36.0)
MCV: 105 fL — ABNORMAL HIGH (ref 78.0–100.0)
Platelets: 139 10*3/uL — ABNORMAL LOW (ref 150–400)
Platelets: 148 10*3/uL — ABNORMAL LOW (ref 150–400)
Platelets: 150 10*3/uL (ref 150–400)
RBC: 4.47 MIL/uL (ref 3.87–5.11)
RDW: 13.3 % (ref 11.5–15.5)
RDW: 13.4 % (ref 11.5–15.5)
RDW: 13.5 % (ref 11.5–15.5)
WBC: 12.5 10*3/uL — ABNORMAL HIGH (ref 4.0–10.5)
WBC: 13.4 10*3/uL — ABNORMAL HIGH (ref 4.0–10.5)
WBC: 8.3 10*3/uL (ref 4.0–10.5)

## 2010-08-09 LAB — CARDIAC PANEL(CRET KIN+CKTOT+MB+TROPI)
CK, MB: 4.1 ng/mL — ABNORMAL HIGH (ref 0.3–4.0)
Relative Index: INVALID (ref 0.0–2.5)
Relative Index: INVALID (ref 0.0–2.5)
Total CK: 34 U/L (ref 7–177)
Troponin I: 0.01 ng/mL (ref 0.00–0.06)

## 2010-08-09 LAB — BASIC METABOLIC PANEL
BUN: 14 mg/dL (ref 6–23)
Calcium: 8.9 mg/dL (ref 8.4–10.5)
Chloride: 97 mEq/L (ref 96–112)
Creatinine, Ser: 0.5 mg/dL (ref 0.4–1.2)
Creatinine, Ser: 0.58 mg/dL (ref 0.4–1.2)
GFR calc Af Amer: >60 mL/min (ref >60)
GFR calc non Af Amer: >60 mL/min (ref >60)
GFR calc non Af Amer: >60 mL/min (ref >60)
Glucose, Bld: 227 mg/dL — ABNORMAL HIGH (ref 70–99)

## 2010-08-09 LAB — PROTIME-INR
INR: 1.41 (ref 0.00–1.49)
INR: 1.82 — ABNORMAL HIGH (ref 0.00–1.49)
INR: 1.85 — ABNORMAL HIGH (ref 0.00–1.49)
INR: 2.03 — ABNORMAL HIGH (ref 0.00–1.49)
INR: 7.03 (ref 0.00–1.49)
INR: 8.66 (ref 0.00–1.49)
Prothrombin Time: 20.9 seconds — ABNORMAL HIGH (ref 11.6–15.2)
Prothrombin Time: 22.8 seconds — ABNORMAL HIGH (ref 11.6–15.2)

## 2010-08-09 LAB — APTT: aPTT: 46 seconds — ABNORMAL HIGH (ref 24–37)

## 2010-08-09 LAB — DIFFERENTIAL
Eosinophils Absolute: 0 10*3/uL (ref 0.0–0.7)
Lymphocytes Relative: 13 % (ref 12–46)
Lymphs Abs: 1.7 10*3/uL (ref 0.7–4.0)
Monocytes Relative: 5 % (ref 3–12)
Neutro Abs: 10.2 10*3/uL — ABNORMAL HIGH (ref 1.7–7.7)
Neutrophils Relative %: 82 % — ABNORMAL HIGH (ref 43–77)

## 2010-08-12 ENCOUNTER — Telehealth: Payer: Self-pay | Admitting: Family Medicine

## 2010-08-12 NOTE — Telephone Encounter (Signed)
Pt would like a referral to see neurologist for back pain. Pt has seen a neurologist in past for back pain.

## 2010-08-13 ENCOUNTER — Telehealth: Payer: Self-pay | Admitting: Family Medicine

## 2010-08-13 NOTE — Telephone Encounter (Signed)
Pt would like a referral to a neurologist- she said she was referred to one a years back and would like to be referred to the same one if possible.  Please advise

## 2010-08-16 ENCOUNTER — Ambulatory Visit (INDEPENDENT_AMBULATORY_CARE_PROVIDER_SITE_OTHER): Payer: Medicare Other | Admitting: *Deleted

## 2010-08-16 ENCOUNTER — Telehealth: Payer: Self-pay | Admitting: Family Medicine

## 2010-08-16 DIAGNOSIS — Z7901 Long term (current) use of anticoagulants: Secondary | ICD-10-CM | POA: Insufficient documentation

## 2010-08-16 DIAGNOSIS — I4891 Unspecified atrial fibrillation: Secondary | ICD-10-CM

## 2010-08-16 LAB — POCT INR: INR: 3.8

## 2010-08-16 NOTE — Telephone Encounter (Signed)
Per Dr. Kevan Rosebush is needed in order to referre the pt to a neurologist.  Appt made for August 23, 2010

## 2010-08-23 ENCOUNTER — Telehealth: Payer: Self-pay | Admitting: *Deleted

## 2010-08-23 ENCOUNTER — Encounter: Payer: Self-pay | Admitting: Family Medicine

## 2010-08-23 ENCOUNTER — Other Ambulatory Visit: Payer: Self-pay

## 2010-08-23 NOTE — Telephone Encounter (Signed)
Pt is calling to change appt.  Sent to scheduling.

## 2010-08-24 ENCOUNTER — Institutional Professional Consult (permissible substitution): Payer: Medicare Other | Admitting: Family Medicine

## 2010-08-24 NOTE — Telephone Encounter (Signed)
callee sand referred to ER for acute illness then see neurologist later

## 2010-08-25 ENCOUNTER — Emergency Department (HOSPITAL_COMMUNITY): Payer: Medicare Other

## 2010-08-25 ENCOUNTER — Encounter (HOSPITAL_COMMUNITY): Payer: Self-pay | Admitting: Radiology

## 2010-08-25 ENCOUNTER — Inpatient Hospital Stay (HOSPITAL_COMMUNITY)
Admission: EM | Admit: 2010-08-25 | Discharge: 2010-09-01 | DRG: 515 | Disposition: A | Discharge status: Skilled Nursing Facility | Payer: Medicare Other | Attending: Internal Medicine | Admitting: Internal Medicine

## 2010-08-25 DIAGNOSIS — I10 Essential (primary) hypertension: Secondary | ICD-10-CM | POA: Diagnosis present

## 2010-08-25 DIAGNOSIS — F1920 Other psychoactive substance dependence, uncomplicated: Secondary | ICD-10-CM | POA: Diagnosis present

## 2010-08-25 DIAGNOSIS — M1990 Unspecified osteoarthritis, unspecified site: Secondary | ICD-10-CM | POA: Diagnosis present

## 2010-08-25 DIAGNOSIS — F1020 Alcohol dependence, uncomplicated: Secondary | ICD-10-CM | POA: Diagnosis present

## 2010-08-25 DIAGNOSIS — R5381 Other malaise: Secondary | ICD-10-CM | POA: Diagnosis present

## 2010-08-25 DIAGNOSIS — G894 Chronic pain syndrome: Secondary | ICD-10-CM | POA: Diagnosis present

## 2010-08-25 DIAGNOSIS — M549 Dorsalgia, unspecified: Secondary | ICD-10-CM | POA: Diagnosis present

## 2010-08-25 DIAGNOSIS — J4489 Other specified chronic obstructive pulmonary disease: Secondary | ICD-10-CM | POA: Diagnosis present

## 2010-08-25 DIAGNOSIS — Z7901 Long term (current) use of anticoagulants: Secondary | ICD-10-CM

## 2010-08-25 DIAGNOSIS — T400X1A Poisoning by opium, accidental (unintentional), initial encounter: Secondary | ICD-10-CM | POA: Diagnosis present

## 2010-08-25 DIAGNOSIS — T4271XA Poisoning by unspecified antiepileptic and sedative-hypnotic drugs, accidental (unintentional), initial encounter: Secondary | ICD-10-CM | POA: Diagnosis present

## 2010-08-25 DIAGNOSIS — I2510 Atherosclerotic heart disease of native coronary artery without angina pectoris: Secondary | ICD-10-CM | POA: Diagnosis present

## 2010-08-25 DIAGNOSIS — I959 Hypotension, unspecified: Secondary | ICD-10-CM | POA: Diagnosis not present

## 2010-08-25 DIAGNOSIS — I059 Rheumatic mitral valve disease, unspecified: Secondary | ICD-10-CM | POA: Diagnosis present

## 2010-08-25 DIAGNOSIS — J449 Chronic obstructive pulmonary disease, unspecified: Secondary | ICD-10-CM | POA: Diagnosis present

## 2010-08-25 DIAGNOSIS — G9349 Other encephalopathy: Secondary | ICD-10-CM | POA: Diagnosis present

## 2010-08-25 DIAGNOSIS — F17200 Nicotine dependence, unspecified, uncomplicated: Secondary | ICD-10-CM | POA: Diagnosis present

## 2010-08-25 DIAGNOSIS — R109 Unspecified abdominal pain: Secondary | ICD-10-CM | POA: Diagnosis present

## 2010-08-25 DIAGNOSIS — T426X4A Poisoning by other antiepileptic and sedative-hypnotic drugs, undetermined, initial encounter: Secondary | ICD-10-CM | POA: Diagnosis present

## 2010-08-25 DIAGNOSIS — M8448XA Pathological fracture, other site, initial encounter for fracture: Principal | ICD-10-CM | POA: Diagnosis present

## 2010-08-25 DIAGNOSIS — J9600 Acute respiratory failure, unspecified whether with hypoxia or hypercapnia: Secondary | ICD-10-CM | POA: Diagnosis present

## 2010-08-25 DIAGNOSIS — I4891 Unspecified atrial fibrillation: Secondary | ICD-10-CM | POA: Diagnosis present

## 2010-08-25 DIAGNOSIS — E1169 Type 2 diabetes mellitus with other specified complication: Secondary | ICD-10-CM | POA: Diagnosis present

## 2010-08-25 LAB — URINALYSIS, ROUTINE W REFLEX MICROSCOPIC
Hgb urine dipstick: NEGATIVE
Specific Gravity, Urine: 1.028 (ref 1.005–1.030)
Urobilinogen, UA: 1 mg/dL (ref 0.0–1.0)
pH: 7.5 (ref 5.0–8.0)

## 2010-08-25 LAB — COMPREHENSIVE METABOLIC PANEL
Albumin: 3.9 g/dL (ref 3.5–5.2)
Alkaline Phosphatase: 99 U/L (ref 39–117)
BUN: 10 mg/dL (ref 6–23)
CO2: 27 mEq/L (ref 19–32)
Chloride: 101 mEq/L (ref 96–112)
Creatinine, Ser: 0.41 mg/dL (ref 0.4–1.2)
GFR calc non Af Amer: >60 mL/min (ref >60)
Glucose, Bld: 77 mg/dL (ref 70–99)
Total Bilirubin: 0.7 mg/dL (ref 0.3–1.2)

## 2010-08-25 LAB — LIPASE, BLOOD: Lipase: 22 U/L (ref 11–59)

## 2010-08-25 LAB — URINE MICROSCOPIC-ADD ON

## 2010-08-25 LAB — CBC
HCT: 45.5 % (ref 36.0–46.0)
MCH: 34.9 pg — ABNORMAL HIGH (ref 26.0–34.0)
MCV: 105.1 fL — ABNORMAL HIGH (ref 78.0–100.0)
Platelets: 275 10*3/uL (ref 150–400)
RBC: 4.33 MIL/uL (ref 3.87–5.11)

## 2010-08-25 LAB — DIFFERENTIAL
Eosinophils Absolute: 0.1 10*3/uL (ref 0.0–0.7)
Monocytes Relative: 9 % (ref 3–12)
Neutro Abs: 8.1 10*3/uL — ABNORMAL HIGH (ref 1.7–7.7)

## 2010-08-25 LAB — PROTIME-INR: INR: 2.09 — ABNORMAL HIGH (ref 0.00–1.49)

## 2010-08-25 LAB — LACTIC ACID, PLASMA: Lactic Acid, Venous: 1 mmol/L (ref 0.5–2.2)

## 2010-08-25 LAB — APTT: aPTT: 42 seconds — ABNORMAL HIGH (ref 24–37)

## 2010-08-25 LAB — CK TOTAL AND CKMB (NOT AT ARMC): Relative Index: INVALID (ref 0.0–2.5)

## 2010-08-25 MED ORDER — IOHEXOL 300 MG/ML  SOLN
100.0000 mL | Freq: Once | INTRAMUSCULAR | Status: AC | PRN
  Administered 2010-08-25: 100 mL via INTRAVENOUS

## 2010-08-25 NOTE — Telephone Encounter (Signed)
Since lethargic , felt sick referred to ER then refer to Neurologist

## 2010-08-26 ENCOUNTER — Inpatient Hospital Stay (HOSPITAL_COMMUNITY): Payer: Medicare Other

## 2010-08-26 LAB — BASIC METABOLIC PANEL
BUN: 5 mg/dL — ABNORMAL LOW (ref 6–23)
GFR calc Af Amer: >60 mL/min (ref >60)
GFR calc non Af Amer: >60 mL/min (ref >60)
Potassium: 3.8 mEq/L (ref 3.5–5.1)
Sodium: 138 mEq/L (ref 135–145)

## 2010-08-26 LAB — CBC
Hemoglobin: 12.6 g/dL (ref 12.0–15.0)
RBC: 3.51 MIL/uL — ABNORMAL LOW (ref 3.87–5.11)

## 2010-08-26 LAB — GLUCOSE, CAPILLARY
Glucose-Capillary: 85 mg/dL (ref 70–99)
Glucose-Capillary: 134 mg/dL — ABNORMAL HIGH (ref 70–99)

## 2010-08-26 LAB — PROTIME-INR
INR: 2.3 — ABNORMAL HIGH (ref 0.00–1.49)
Prothrombin Time: 25.4 seconds — ABNORMAL HIGH (ref 11.6–15.2)

## 2010-08-26 LAB — HEMOGLOBIN A1C: Mean Plasma Glucose: 114 mg/dL (ref <117)

## 2010-08-26 LAB — MAGNESIUM: Magnesium: 1.7 mg/dL (ref 1.5–2.5)

## 2010-08-26 NOTE — H&P (Signed)
[name], [name]                 ACCOUNT NO.:  [id number]  MEDICAL RECORD NO.:  [id number]           PATIENT TYPE:  E  LOCATION:  [facility]                         FACILITY:  [facility]  PHYSICIAN:  Vania Rea, M.D. DATE OF BIRTH:  09/08/1932  DATE OF ADMISSION:  08/25/2010 DATE OF DISCHARGE:                             HISTORY & PHYSICAL   PRIMARY CARE PHYSICIAN:  Tawny Asal, MD  CARDIOLOGIST:  Duke Salvia, MD, [facility]  CHIEF COMPLAINT:  Severely worsening abdominal pain over the past few days.  HISTORY OF PRESENT ILLNESS:  This is a 77 year old Caucasian lady with COPD, diabetes, and hypertension as well as coronary artery disease, who reports chronic abdominal pain of unknown etiology for many years, which she is usually able to tolerate, but reports that for the past few days the pain has become unbearable and she could not take it any more and she is brought to the emergency room for further evaluation.  The patient also complains of low back pain and the pain radiates around to her abdomen.  There is no associated nausea, vomiting, or diarrhea, and no difficulty breathing.  No fever, cough, or cold.  In the emergency room, the patient was evaluated.  CT scans of the abdomen failed to find any acute abnormality.  She was however, noted to have new compression fractures in her thoracic spine.  The patient as well as being tender over the mid-thoracic spine, is also tender over the lumbar spine.  The patient's pain is controlled with narcotic medication in the emergency room.  However, the amount of medication requiring pain control is associated with significant desaturation.  The patient is not considered safe for discharge home and the hospitalist service was called to assist with management.  PAST MEDICAL HISTORY: 1. Osteoarthritis. 2. Hypertension. 3. Diabetes. 4. Paroxysmal atrial fibrillation. 5. Chronic back pain, narcotic dependent. 6. Lumbar disk  disease. 7. Mitral valve prolapse. 8. Esophageal stricture. 9. GERD. 10.Diverticulosis. 11.History of alcohol dependence.  MEDICATIONS: 1. Pravachol 80 mg at bedtime. 2. Paxil 20 mg daily. 3. Amaryl 2 mg twice daily. 4. Coumadin 2.5 mg 1 or 2 tablets daily per protocol. 5. Vitamin D 1000 units daily. 6. Verapamil 180 mg twice daily. 7. Temazepam 30 mg at bedtime. 8. Ramipril 5 mg daily. 9. Metoprolol 12.5 mg twice daily. 10.Metformin 500 mg daily. 11.Hydrocodone 10/650 one tablets every 6 hours as needed. 12.Fentanyl patch 50 mcg daily applied every 3 days. 13.Diazepam 5 mg 1 tablet every 6 hours as needed for anxiety.  ALLERGIES:  CODEINE.  SOCIAL HISTORY:  She is a retired Production designer, theatre/television/film of an Merchandiser, retail.  She smokes half to one pack of cigarettes per day since a teenager.  She reports 2 or 3 gin and tonics about 3 times per week, husband says daily, by report. She denies illicit drug use.  FAMILY HISTORY:  Significant for mother, who died at age 87 from heart disease and father died at age 21 from throat cancer.  Sister with atrial fibrillation.  REVIEW OF SYSTEMS:  Other than noted above, significant only for a gait impairment.  She walks mostly with the use of a cane.  She does have a walker.  PHYSICAL EXAMINATION:  GENERAL:  Very charming elderly Caucasian lady, sitting up in the stretcher, does not appear very distressed, but is asking for pain medication. VITAL SIGNS:  Temperature is 97.2, pulse 71, respiration 18, blood pressure 175/80, she is saturating at 98% on 2 L. HEENT:  Her pupils are round and equal.  Mucous membranes pink. Anicteric. NECK:  No cervical lymphadenopathy.  No thyromegaly.  No carotid bruit. CHEST:  She has prolonged expiration and kyphosis. CARDIOVASCULAR:  Regular rhythm. ABDOMEN:  Obese, soft, nontender.  No masses. EXTREMITIES:  She has marked arthritic deformities of the hands and wrists with ganglions of both  wrists.  She has marked arthritic deformity of the feet and she has no edema. MUSCULOSKELETAL:  Muscle tone and bulk is good for age.  She has a kyphosis of the thoracic spine and is tender over the mid-thoracic spine.  She is also tender over the lumbar spine. SKIN:  She has an old bruise in the right posterior shoulder, status post remote fall. CENTRAL NERVOUS SYSTEM:  She has no focal lateralizing signs.  Her gait was not tested.  LABORATORY DATA:  Her white count is 10.9, hemoglobin 15.1, platelets 275.  Absolute neutrophil count is 8.1.  Differential is otherwise unremarkable.  Her complete metabolic panel is unremarkable with a sodium of 137, potassium 4.6, chloride 101, CO2 is normal at 27, glucose 77, BUN 10, creatinine 0.4.  Her albumin is 3.9.  Her calcium is 9.5. Her liver functions are normal.  Her lipase is normal.  Her venous lactic acid is normal at 1.0.  Cardiac enzymes are completely normal. Urinalysis shows cloudy urine, specific gravity 1.028, trace of ketones, 30 proteins, nitrites negative, leukocyte esterase small.  Urine microscopy was unremarkable.  RADIOLOGIC STUDIES:  Two-view chest x-ray shows no active cardiopulmonary disease, compression fractures at T5 and T10 are new since March 2011.  CT scan of the abdomen and pelvis with contrast shows known bilaterally adrenal masses.  Previously worked up was benign adenomas, prominent uterus for her age without definite mass effect, heavy vascular calcifications, no obvious acute abnormality.  Her EKG shows sinus rhythm, premature atrial complexes, anteroseptal infarct, age undetermined, no acute EKG changes.  ASSESSMENT: 1. Acute-on-chronic pain of unclear etiology, questionable acute     vertebral compression fractures. 2. Acute respiratory failure associated with narcotic pain medications. 3. Apparent narcotic dependence. 4. Alcohol dependence, but according to her husband is somewhat in     denial about her  alcohol use. 5. Chronic obstructive pulmonary disease. 6. Hypertension. 7. Diabetes. 8. Paroxysmal atrial fibrillation. 9. Osteoarthritis. 10.Gait abnormality.  PLAN:  We will bring this lady in for pain management and we will consult social work in case she may need home oxygen.  We will get physical therapy and assess see what is safe for her to be discharged home with.  In the meantime, we will get an MRI of the thoracic and lumbar spine to rule out any acute compression fractures, which may have precipitated today's hospital visit.  Other plans as per orders.     Vania Rea, M.D.     LC/MEDQ  D:  08/25/2010  T:  08/25/2010  Job:  [id number]  cc:   Ellin Saba., MD [address] [place] [address]  Duke Salvia, MD, [facility] [address]  [address] [place] [address]  Electronically Signed by  Vania Rea M.D. on 08/26/2010 05:29:10 AM

## 2010-08-27 LAB — BLOOD GAS, ARTERIAL
Bicarbonate: 30.8 mEq/L — ABNORMAL HIGH (ref 20.0–24.0)
Patient temperature: 98.6
TCO2: 27.6 mmol/L (ref 0–100)
pH, Arterial: 7.395 (ref 7.350–7.400)

## 2010-08-27 LAB — GLUCOSE, CAPILLARY
Glucose-Capillary: 120 mg/dL — ABNORMAL HIGH (ref 70–99)
Glucose-Capillary: 157 mg/dL — ABNORMAL HIGH (ref 70–99)

## 2010-08-27 LAB — PROCALCITONIN: Procalcitonin: <0.1 ng/mL

## 2010-08-27 LAB — LACTIC ACID, PLASMA: Lactic Acid, Venous: 0.7 mmol/L (ref 0.5–2.2)

## 2010-08-27 LAB — MRSA PCR SCREENING: MRSA by PCR: NEGATIVE

## 2010-08-28 ENCOUNTER — Inpatient Hospital Stay (HOSPITAL_COMMUNITY): Payer: Medicare Other

## 2010-08-28 LAB — URINALYSIS, MICROSCOPIC ONLY
Glucose, UA: NEGATIVE mg/dL
Leukocytes, UA: NEGATIVE
Protein, ur: NEGATIVE mg/dL
Urobilinogen, UA: 0.2 mg/dL (ref 0.0–1.0)

## 2010-08-28 LAB — BASIC METABOLIC PANEL
CO2: 33 mEq/L — ABNORMAL HIGH (ref 19–32)
Calcium: 8.6 mg/dL (ref 8.4–10.5)
Creatinine, Ser: 0.47 mg/dL (ref 0.4–1.2)
Glucose, Bld: 66 mg/dL — ABNORMAL LOW (ref 70–99)

## 2010-08-28 LAB — CBC
HCT: 35.1 % — ABNORMAL LOW (ref 36.0–46.0)
Hemoglobin: 11 g/dL — ABNORMAL LOW (ref 12.0–15.0)
MCH: 34.1 pg — ABNORMAL HIGH (ref 26.0–34.0)
MCHC: 31.3 g/dL (ref 30.0–36.0)
MCV: 108.7 fL — ABNORMAL HIGH (ref 78.0–100.0)

## 2010-08-28 LAB — GLUCOSE, CAPILLARY: Glucose-Capillary: 153 mg/dL — ABNORMAL HIGH (ref 70–99)

## 2010-08-29 LAB — BASIC METABOLIC PANEL
CO2: 31 mEq/L (ref 19–32)
Calcium: 8.7 mg/dL (ref 8.4–10.5)
Chloride: 101 mEq/L (ref 96–112)
Glucose, Bld: 98 mg/dL (ref 70–99)
Sodium: 138 mEq/L (ref 135–145)

## 2010-08-29 LAB — GLUCOSE, CAPILLARY
Glucose-Capillary: 230 mg/dL — ABNORMAL HIGH (ref 70–99)
Glucose-Capillary: 81 mg/dL (ref 70–99)
Glucose-Capillary: 72 mg/dL (ref 70–99)

## 2010-08-29 LAB — CBC
MCH: 35 pg — ABNORMAL HIGH (ref 26.0–34.0)
MCV: 106.2 fL — ABNORMAL HIGH (ref 78.0–100.0)
Platelets: 196 10*3/uL (ref 150–400)
RBC: 3.37 MIL/uL — ABNORMAL LOW (ref 3.87–5.11)
RDW: 12.6 % (ref 11.5–15.5)

## 2010-08-30 ENCOUNTER — Encounter: Payer: Medicare Other | Admitting: *Deleted

## 2010-08-30 ENCOUNTER — Inpatient Hospital Stay (HOSPITAL_COMMUNITY): Payer: Medicare Other

## 2010-08-30 LAB — GLUCOSE, CAPILLARY: Glucose-Capillary: 160 mg/dL — ABNORMAL HIGH (ref 70–99)

## 2010-08-30 LAB — BASIC METABOLIC PANEL
BUN: 4 mg/dL — ABNORMAL LOW (ref 6–23)
CO2: 33 mEq/L — ABNORMAL HIGH (ref 19–32)
Calcium: 9.1 mg/dL (ref 8.4–10.5)
Creatinine, Ser: 0.47 mg/dL (ref 0.4–1.2)
GFR calc non Af Amer: >60 mL/min (ref >60)
Glucose, Bld: 84 mg/dL (ref 70–99)

## 2010-08-30 LAB — PROTIME-INR
INR: 1.41 (ref 0.00–1.49)
Prothrombin Time: 17.5 seconds — ABNORMAL HIGH (ref 11.6–15.2)

## 2010-08-30 LAB — CBC
Hemoglobin: 13.8 g/dL (ref 12.0–15.0)
MCHC: 32.8 g/dL (ref 30.0–36.0)
Platelets: 216 10*3/uL (ref 150–400)
RDW: 12.3 % (ref 11.5–15.5)

## 2010-08-30 NOTE — Progress Notes (Signed)
This encounter was created in error - please disregard.

## 2010-08-31 ENCOUNTER — Telehealth: Payer: Self-pay | Admitting: *Deleted

## 2010-08-31 LAB — GLUCOSE, CAPILLARY
Glucose-Capillary: 187 mg/dL — ABNORMAL HIGH (ref 70–99)
Glucose-Capillary: 120 mg/dL — ABNORMAL HIGH (ref 70–99)

## 2010-08-31 LAB — CBC
MCH: 34.5 pg — ABNORMAL HIGH (ref 26.0–34.0)
Platelets: 191 10*3/uL (ref 150–400)
RBC: 3.88 MIL/uL (ref 3.87–5.11)

## 2010-08-31 LAB — BASIC METABOLIC PANEL
BUN: 9 mg/dL (ref 6–23)
Calcium: 9 mg/dL (ref 8.4–10.5)
GFR calc non Af Amer: >60 mL/min (ref >60)
Glucose, Bld: 121 mg/dL — ABNORMAL HIGH (ref 70–99)
Sodium: 137 mEq/L (ref 135–145)

## 2010-08-31 LAB — PROTIME-INR: Prothrombin Time: 17.6 seconds — ABNORMAL HIGH (ref 11.6–15.2)

## 2010-08-31 NOTE — Telephone Encounter (Signed)
Pt's husband says he MUST speak to Dr. Scotty Court today.  Pt is in the hospital, and he is flying to [facility] tomorrow.

## 2010-09-01 ENCOUNTER — Inpatient Hospital Stay (HOSPITAL_COMMUNITY): Payer: Medicare Other

## 2010-09-01 LAB — PROTIME-INR
INR: 1.12 (ref 0.00–1.49)
Prothrombin Time: 14.6 seconds (ref 11.6–15.2)

## 2010-09-01 LAB — GLUCOSE, CAPILLARY

## 2010-09-01 LAB — BASIC METABOLIC PANEL
Chloride: 97 mEq/L (ref 96–112)
GFR calc Af Amer: >60 mL/min (ref >60)
Potassium: 3.6 mEq/L (ref 3.5–5.1)

## 2010-09-01 LAB — CBC
Platelets: 247 10*3/uL (ref 150–400)
RBC: 4.12 MIL/uL (ref 3.87–5.11)
WBC: 9.3 10*3/uL (ref 4.0–10.5)

## 2010-09-01 NOTE — Telephone Encounter (Signed)
Talk with the husband and can place for 3-4 weeks. On discharge patient is coming in or examination

## 2010-09-01 NOTE — Telephone Encounter (Signed)
Pt's husband is calling Dr. Scotty Court again about his wife being transferred to a skilled nursing facility, and has to talk to him TODAY.

## 2010-09-01 NOTE — Discharge Summary (Signed)
  NAMESHIVONNE, [name]                 ACCOUNT NO.:  [id number]  MEDICAL RECORD NO.:  [id number]           PATIENT TYPE:  I  LOCATION:  1511                         FACILITY:  [facility]  PHYSICIAN:  Kathlen Mody, MD       DATE OF BIRTH:  04/28/1933  DATE OF ADMISSION:  08/25/2010 DATE OF DISCHARGE:  09/01/2010                              DISCHARGE SUMMARY   ADDENDUM:  DISCHARGE MEDICATIONS: 1. Dulcolax 10 mg p.r.n. daily. 2. Fleet's enema p.r.n. 3. Oxycodone 5 mg p.o. q.6h p.r.n. 4. Vitamin B1 100 mg p.o. daily. 5. Folic acid 1 mg p.o. daily. 6. Multivitamin 1 tablet p.o. daily. 7. MiraLAX 17 g p.r.n. 8. Diazepam 5 mg q.12h p.o. p.r.n. 9. Verapamil 180 mg 1 tablet b.i.d. 10.Metoprolol 12.5 mg 1 tablet b.i.d. 11.Paxil 20 mg daily. 12.Lamictal 5 mg daily. 13.Vitamin D 1000 mg 1 tablet daily. 14.Pravachol 80 mg 1 tablet at bedtime. 15.Metformin 100 mg 1 tablet daily. 16.Glimepiride 2 mg 1 tablet b.i.d. a.c. 17.Temazepam 30 mg at bedtime. 18.Coumadin 2.5 to 5 mg 1 tablet daily to keep the INR between two to     three.  On the day of discharge, the patient's vitals include a temperature of 99, pulse of 87, respiration rate 16, blood pressure 133/79, saturating 94% on room air.  She is alert, afebrile, comfortable.  CARDIOVASCULAR: S1, S2 heard. LUNGS:  Clear.  ABDOMEN:  Soft, nontender.  Bowel sounds are heard.  EXTREMITIES:  No pedal edema.  The patient at this time is hemodynamically stable for discharge.  The patient needs to follow up with the MD at the SNF to check on her INR to be between two to three and dose Coumadin as per the INR, and also to follow up with her primary care physician once she is discharged from the SNF.          ______________________________ Kathlen Mody, MD     VA/MEDQ  D:  09/01/2010  T:  09/01/2010  Job:  [id number]  Electronically Signed by Kathlen Mody MD on 09/01/2010 09:48:11 PM

## 2010-09-02 NOTE — Discharge Summary (Signed)
[name], [name]                 ACCOUNT NO.:  [id number]  MEDICAL RECORD NO.:  [id number]           PATIENT TYPE:  I  LOCATION:  1511                         FACILITY:  [facility]  PHYSICIAN:  Erick Blinks, MD     DATE OF BIRTH:  1932/12/28  DATE OF ADMISSION:  08/25/2010 DATE OF DISCHARGE:                        DISCHARGE SUMMARY - REFERRING   PRIMARY CARE PHYSICIAN:  Tawny Asal, MD  DISCHARGE DIAGNOSES: 1. Acute-on-chronic back pain secondary to T5 and T10 compression     fracture, status post vertebroplasty. 2. Chronic pain syndrome with chronic narcotic use. 3. Encephalopathy secondary to opioid/benzodiazepines, resolved. 4. Paroxysmal atrial fibrillation, on Coumadin. 5. Non-insulin-dependent diabetes. 6. History of alcohol abuse with no signs of withdrawal. 7. Tobacco abuse. 8. Chronic obstructive pulmonary disease. 9. Deconditioning. 10.Acute respiratory failure secondary to narcotics, resolved.  DISCHARGE MEDICATIONS:  These will be reconciled by the discharging physician.  ADMISSION HISTORY:  This is a 77 year old female who reports to the emergency room with worsening back pain as well as abdominal pain.  The patient reported that she has chronic abdominal pain, but it was worse so in the past few days prior to admission.  She was also complaining of low back pain, which radiated around to her abdomen.  There was no associated nausea, vomiting, or diarrhea.  CT scan of the abdomen failed to find any acute abnormality.  Further questioning revealed that the patient had had multiple falls at home for the past 2 weeks and since then her chronic pain has significantly gotten worse.  The patient has been on chronic narcotics for her chronic pain and she was admitted to the hospital for further evaluation.  For further details, please refer to the history and physical dictated by Dr. Orvan Falconer on April 11th.  HOSPITAL COURSE: 1. Acute-on-chronic back pain.   The patient was found to have     compression fractures of T5 and T10 vertebrae.  She was seen by     Interventional Radiology, who completed vertebroplasty on the     patient at both of the sites.  Postprocedure, the patient has     reported a dramatic improvement in her pain.  She is comfortable.     She is no longer requiring large amount of pain medicines.  She is     in good spirits.  She has been working with physical therapy and it     is recommended that she undergo placement to a skilled nursing     facility for short-term physical therapy.  This is currently in the     works and once a bed is available, the patient can be transferred     for continued care. 2. Acute respiratory failure and encephalopathy secondary to over use     of narcotics/benzodiazepine.  The patient was initially placed on     Ativan for CIWA protocol.  On admission, she was continued on her     fentanyl patch as well as oxycodone was given, p.r.n. IV narcotics     for breakthrough pain.  The patient was found to be increasingly  lethargic and hypoxic requiring Venti mask to help with     oxygenation.  She was subsequently transferred to the step-down     unit for closer monitoring and showed a significant improvement     after a single dose of Narcan.  She was monitored in the step-down     unit overnight and with conservative measures, her mental status as     well as her hypoxia began to improve.  The patient was also     somewhat hypotensive and received a fair amount of fluid.  Her     fentanyl patch as well as her IV narcotics were discontinued.  She     is only kept on p.r.n. oxycodone at this time and she appears to be     comfortable.  It is recommended that the patient not be on large     doses of narcotics as this may have also been contributing to her     falls.  Her encephalopathy has resolved and she is currently back     to her baseline. 3. Acute respiratory failure secondary to above.   The patient likely     also has a component of mild pulmonary edema after the large amount     of fluids she received for her hypotension.  We will give her Lasix     and repeat a chest x-ray in the morning to see if there is any     improvement in any pulmonary venous congestion. 4. Atrial fibrillation.  The patient's Coumadin will be placed on hold     for her vertebroplasty.  It will be resumed today and this can be     further followed as an outpatient with a goal INR between 2 to 3. 5. Hypertension.  The patient is continued on her outpatient regimen     and her blood pressure is under fair control. 6. Alcohol abuse.  The patient did not have any signs of withdrawal.     She was kept on CIWA protocol, which has since been discontinued.     She was strongly advised of the harmful effects of abuse of     alcohol. 7. Tobacco abuse.  The patient was counseled on harmful effects of the     tobacco.  CONSULTATIONS:  Interventional Radiology for vertebroplasty.  PROCEDURES:  Vertebroplasty of T5 and T10 vertebrae on April 16th without any complications.  DIAGNOSTIC IMAGING: 1. Chest x-ray from April 11th shows no active cardiopulmonary     disease. 2. CT abdomen and pelvis on April 11th shows bilateral adrenal masses.     These were demonstrating to be benign adenomas on recent MRI,     prominent uterus for age without definite mass, heavy vascular     calcifications, no obvious acute abnormality. 3. MRI of lumbar spine shows no acute abnormalities, severe     degenerative disk disease from L1-L2 through L5-S1, marked     compression of the thecal sac at L4-L5 due to a combination of     epidural fat and sprain from the vertebral end plates, mild spinal     stenosis of L2-L3 and moderate spinal stenosis of L3-L4 and L5-S1. 4. MRI of thoracic spine shows benign appearing acute compression     fractures of T5 and T10.  No significant impingement upon the     spinal canal, subacute or  old mild compression fracture of the     superior plate at T3, small disk protrusion  centrally at T9-T10     without significant impingement upon the spinal cord or nerve     roots. 5. Chest x-ray from April 14th shows cardiac enlargement, pulmonary     venous congestion, left base atelectasis.  DISCHARGE INSTRUCTIONS:  The patient is to do on a heart-healthy diet, conduct her activity as tolerated.  She will need monitoring of her INR to ensure that it is in range between 2 to 3 since she has been restarted on Coumadin.  She should be given pain medication sparingly as she has shown to be sensitive to it.  She will need to follow up with her primary care physician once she is discharged from the skilled nursing facility.  Plan was discussed with the patient, who was also in agreement.  CONDITION AT TIME OF DISCHARGE:  Improved.     Erick Blinks, MD     JM/MEDQ  D:  08/31/2010  T:  08/31/2010  Job:  [id number]  cc:   Ellin Saba., MD [address] [place] [address]  Electronically Signed by Erick Blinks  on 09/02/2010 01:28:45 PM

## 2010-09-22 ENCOUNTER — Ambulatory Visit: Payer: Self-pay | Admitting: Cardiology

## 2010-09-22 LAB — POCT INR: INR: 1.7

## 2010-09-25 ENCOUNTER — Other Ambulatory Visit: Payer: Self-pay | Admitting: Family Medicine

## 2010-09-28 NOTE — H&P (Signed)
NAMEWHITLEE, SLUDER                 ACCOUNT NO.:  [id number]   MEDICAL RECORD NO.:  [id number]          PATIENT TYPE:  INP   LOCATION:  6526                         FACILITY:  [facility]   PHYSICIAN:  Madolyn Frieze. Jens Som, MD, FACCDATE OF BIRTH:  01-23-1933   DATE OF ADMISSION:  07/18/2007  DATE OF DISCHARGE:                              HISTORY & PHYSICAL   PRIMARY CARE PHYSICIAN:  Dr.  Cecille Rubin.   CARDIOLOGIST:  Was Dr. Glennon Hamilton years ago, will now be Dr. Jens Som.   CHIEF COMPLAINT:  Chest pain and palpitations.   HISTORY OF PRESENT ILLNESS:  Ms. Vandehei is a 77 year old female with no  history of coronary artery disease.  She has approximately 15-month  history of intermittent palpitations.  At first, the palpitations were  relatively infrequent, but after she developed pneumonia in mid January  they became worse.  She felt very bad because of the pneumonia but also  felt that her heart was out of rhythm most of the time.  Yesterday, she  was still having palpitations and they seemed slightly worse.  They woke  her last p.m. and she was very uncomfortable.  Today, her housekeeper  made her go to her primary care physician.  Dr. Scotty Court saw her  immediately and sent her to the emergency room.   The major symptoms that Ms. Merida has with the palpitations is a feeling  of being jittery.  She also feels weak and is short of breath.  She has  dizziness occasionally.  She has increased dyspnea on exertion, but her  activity level is extremely limited because of her degenerative joint  disease.  She also has occasional chest tightness with the palpitations.  Prior to developing the palpitations, she does not ever remember getting  chest tightness with exertion.   PAST MEDICAL HISTORY:  1. Hypertension.  2. Hyperlipidemia.  3. Family history of coronary artery disease (not premature).  4. Ongoing tobacco use.  5. Diabetes.  6. History of mitral valve prolapse.  7. History of  pneumonia in January 2008  8. History of low bundle branch block.  9. Degenerative joint disease with chronic back pain.  10.History of a ganglion cyst to the right wrist.   SURGICAL HISTORY:  She is status post EGD colonoscopy and left ankle  surgery after an injury.   ALLERGIES:  She is intolerant to codeine, OxyContin and methadone.   CURRENT MEDICATIONS:  1. Fentanyl patch 50 mcg every 3 days.  2. Vicodin 10/650 b.i.d. p.r.n.  3. Xanax 0.25 mg b.i.d.  4. Amaryl 2 mg 1/2 tablet b.i.d.  5. Nabumetone 750 mg b.i.d.  6. Paxil 40 mg a day.  7. Altace 10 mg a day.  8. Zocor 40 mg a day.  9. Cardia XT 360 mg a day.  10.Restoril 30 mg q.h.s.  11.Zetia 10 mg a day.  12.Dipyridamole 50 mg b.i.d.   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  She has  approximately a 40 pack-year history of ongoing  tobacco use and has one  drink before dinner except when taking medications that are prohibited  with alcohol which has been the case recently.   FAMILY HISTORY:  Mother died at age 8 with a history of cardiac  problems.  Her father died at age 56 of cancer.  She has one sister with  atrial fibrillation but neither one of her siblings has heart disease.   REVIEW OF SYSTEMS:  She has had no recent fevers, chills or sweats since  recovering from the pneumonia.  The coughing and wheezing that she had  with that is improved.  She has significant vision loss because of  macular degeneration.  She may be getting some dizziness and presyncope  with the atrial fibrillation.  The chest pain is a tightness and  associated with the atrial fibrillation and reaches a 3-4/10.  She has  chronic arthralgias and pain mainly in her back.  Full 14 point review  of systems is otherwise negative.   PHYSICAL EXAMINATION:  VITAL SIGNS:  Temperature is 97.2, blood pressure  125/64, pulse 97, respiratory rate 24, O2  saturation 99% on room air.  GENERAL:  She is a well-developed elderly white female in moderate  distress.  HEENT:  Normal.  NECK:  There is no lymphadenopathy, thyromegaly, bruit or JVD noted.  CV:  Heart is rapid and irregular in rate and rhythm with an S1-S2 and a  possible murmur is noted, but this is not definite.  LUNGS:  Essentially clear to auscultation bilaterally.  SKIN:  No rashes or lesions are noted.  ABDOMEN:  Soft and nontender with active bowel sounds.  EXTREMITIES:  There is no cyanosis, clubbing or edema noted.  MUSCULOSKELETAL:  She has a joint deformity to her right wrist, but no  spine or CVA tenderness and joint deformity is not new.  NEUROLOGICAL:  She is alert and oriented with cranial nerves II-XII  grossly intact.   STUDIES:  Chest x-ray is pending.   EKG is atrial flutter with rate 124 with no acute ischemic changes.   LABORATORY DATA:  Laboratory values are pending at the time of  dictation.   IMPRESSION:  Ms. Nold is a 77 year old female with a past medical  history of diabetes, hypertension, hyperlipidemia and mitral valve  prolapse with atrial flutter which began about 2 months ago and is  associated with chest heaviness, palpitations and dyspnea on exertion.  She denies orthopnea, PND or pedal edema.  Her EKG is atrial flutter  with a left bundle branch block.  She will be admitted and we will cycle  cardiac enzymes.  We will check an echocardiogram and a TSH.  We will  discontinue the Altace as well as the dipyridamole and continue rate  control with IV Cardizem, changing to p.o. when she is stabilized.  We  will add IV heparin for anticoagulation and add Coumadin once she has  ruled out for MI and no further invasive workup is indicated.  We will  schedule her for a TEE in the morning.  If there is no thrombosis, then  consideration can be given to cardioversion versus an ablation depending  on the EP evaluation.  If her cardiac enzymes are  negative, an  outpatient Myoview is indicated to evaluate for ischemia.  She is  strongly encouraged to discontinue tobacco use.  She will be continued  on her other home medications with the exception of the dipyridamole and  the nabumetone.      Bjorn Loser  Barrett, PA-C      Madolyn Frieze. Jens Som, MD, [facility]  Electronically Signed    RB/MEDQ  D:  07/18/2007  T:  07/19/2007  Job:  [id number]   cc:   Ellin Saba., MD

## 2010-09-28 NOTE — Assessment & Plan Note (Signed)
[facility]                         ELECTROPHYSIOLOGY OFFICE NOTE   [name], [name]                          MRN:          [id number]  DATE:02/22/2008                            DOB:          [birth year]    [name] is seen in followup for atrial fibrillation.  This has been  quiescent.  She feels really pretty good at this point.  What is  interesting is that we thought that her atrial fibrillation persistent  as of 4 months ago, but she spontaneously converted to sinus rhythm for  which we are all grateful.  She is taking of Cardizem throughout the day  and this has helped .   Her major problem is pain related to her back and I have encouraged her  to set a followup and establish with a pain clinic.   Her medications include Amaryl 1 b.i.d., Coumadin, fentanyl 50 patch q.3  days, Paxil 20, Zocor 80, Altace 5, diltiazem.   On examination, her blood pressure today was 114/66 with a pulse of 54.  The lungs were clear.  The heart sounds were regular.  The extremities  were without edema.   Electrocardiogram dated today demonstrated sinus rhythm at 664 0.2/0.47,  the axis was leftward at -50.   IMPRESSION:  1. Paroxysmal atrial fibrillation.  2. Incomplete left bundle-branch block with left axis deviation.  3. Chronic back pain.  4. Diabetes.  5. Coumadin therapy for the above.   [name] is stable.  We will plan to see her again in 1 year's time.      Duke Salvia, MD, [facility]  Electronically Signed    SCK/MedQ  DD: 02/22/2008  DT: 02/22/2008  Job #: [id number]   cc:   Ellin Saba., MD

## 2010-09-28 NOTE — Discharge Summary (Signed)
[name], [name]                 ACCOUNT NO.:  [id number]   MEDICAL RECORD NO.:  [id number]          PATIENT TYPE:  INP   LOCATION:  6526                         FACILITY:  [facility]   PHYSICIAN:  Doylene Canning. Ladona Ridgel, MD    DATE OF BIRTH:  1932-08-24   DATE OF ADMISSION:  07/18/2007  DATE OF DISCHARGE:  07/21/2007                               DISCHARGE SUMMARY   FINAL DISCHARGE DIAGNOSES:  1. Atrial flutter/atrial fibrillation.  2. A left bundle branch block.  3. Hypertension.  4. Diabetes.  5. Hyperlipidemia.  6. Macular degeneration.  7. History of mitral valve prolapse.  8. History of pneumonia.  9. Degenerative joint disease.  10.Ongoing tobacco abuse.   PRIMARY CARE PHYSICIAN:  Tawny Asal, MD.   PRIMARY CARDIOLOGIST:  Madolyn Frieze. Jens Som, MD.   ELECTROPHYSIOLOGIST:  Duke Salvia, MD.   PROCEDURES PERFORMED DURING HOSPITALIZATION:  TEE.   HOSPITAL COURSE:  This is a 77 year old, Caucasian female, with no prior  history of coronary artery disease, who had complaints of intermittent  palpitations.  They were infrequent, but she developed pneumonia in  January and, after recovering from this, palpitations became worse and  she began to feel very badly because of that.  The patient was awakened  by palpitations the evening prior to her admission and became very  uncomfortable with it.  She was seen by her primary care physician, Dr.  Scotty Court, who sent her to the emergency room.  The patient states that  she had been feeling palpitations and jittery ever since and has  weakness and shortness of breath with occasional dizziness.  The patient  was admitted to be evaluated further.  Initial EKGs were completed  revealing atrial flutter with a rate of 124 beats per minute with no  ischemic changes.  The patient's Altace and Dipyridamole were  discontinued and was started on IV Cardizem.   The patient continued to do well and had a subsequent TEE completed per  Dr.  Graciela Husbands.  The patient had spontaneous cardioversion to normal sinus  rhythm without cardioversion.  The patient was started on p.o. Coumadin  and low molecular weight heparin bridging until Coumadin level was  therapeutic.  On discharge, her INR was 1.0 and she was discharged on 5  mg daily.   The patient was seen and examined by Dr. Lewayne Bunting on day of  discharge and found to be stable.  The patient remained in normal sinus  rhythm.  She will be placed back home on p.o. Cartia and will follow up  with Dr. Graciela Husbands in about three to four weeks.  Dr. Graciela Husbands had mentioned in  his progress notes that a life watch monitor may be indicated.  Dr.  Ladona Ridgel is deferring that to Dr. Graciela Husbands to have this placed if he so  desires.  At this time, the patient was not placed on this.   DISCHARGE VITAL SIGNS:  Blood pressure 156/79, heart rate 65,  respirations 20.   Hemoglobin 11.6, hematocrit 33, white blood cells 6.1, platelets 169,  sodium 134, potassium 4.0, chloride 105, CO2  25, BUN 16, creatinine 0.6,  glucose 125.   DISCHARGE MEDICATIONS:  1. Amaryl 1 mg twice a day.  2. Zocor 40 mg at bedtime.  3. Paxil 40 mg daily.  4. Lovenox 50 mg subcu twice a day.  5. Coumadin 5 mg daily to be assessed by PT-INR.  6. Cartia-XT 360 mg daily.  7. Zetia 10 mg daily.  8. Fentanyl patch 50 mcg every 3 days.  9. Vicodin 10/650 three times a day p.r.n.  10.Altace 10 mg daily.   ALLERGIES:  1. CODEINE.  2. METHADONE.   Patient has been advised to stop taking Dipyridamole.   FOLLOWUP PLANS AND APPOINTMENTS:  1. The patient will be followed by Dr. Sherryl Manges in three to four      weeks.  This is the weekend, and our office will call her to make      the appointment.  2. The patient is to be seen by [facility]madin Clinic on Tuesday, March      10th, for evaluation of PT-INR.  3. The patient will continue the Lovenox subcutaneously until told      otherwise by Coumadin Clinic.   Time spent with the patient  to include physician time, 40 minutes.      Bettey Mare. Lyman Bishop, NP      Doylene Canning. Ladona Ridgel, MD  Electronically Signed    KML/MEDQ  D:  07/21/2007  T:  07/21/2007  Job:  [id number]   cc:   Ellin Saba., MD

## 2010-09-28 NOTE — Consult Note (Signed)
NAMELEATHIE, WEICH                 ACCOUNT NO.:  [id number]   MEDICAL RECORD NO.:  [id number]          PATIENT TYPE:  INP   LOCATION:  4731                         FACILITY:  [facility]   PHYSICIAN:  Melvyn Novas, M.D.  DATE OF BIRTH:  11-06-1932   DATE OF CONSULTATION:  09/11/2007  DATE OF DISCHARGE:                                 CONSULTATION   REQUESTING PHYSICIAN:  Duke Salvia, MD, [facility], [facility] cardiology.   PRIMARY CARE PHYSICIAN:  Tawny Asal, MD, [place].   ORTHOPEDIC SURGEON:  Mark C. Ophelia Charter, M.D.   This patient was admitted on September 07, 2007, after a fall from her  private home.  It appears that she sustained a dislocation of the right  elbow and had a closed reduction procedure done.  She is currently  wearing a cast over her right arm.  She was found to be in atrial  fibrillation with rapid ventricular response which might have caused to  fall.  She was admitted to 4700 for the treatment of her cardiac  condition, and also to determine under which circumstances the patient  can return home.   The patient yesterday night was restless and agitated and Dr. Graciela Husbands  suspected the development of a delirium, this is the reason for today's  neurology consultation.   The patient is appearing confused, had mental status changes.  In her  own review of systems, she endorsed only some problems with right arm  swelling which is related to her fall injury and also stated that Dr.  Graciela Husbands had mentioned that she has a tremor at rest which I see as an  intention tremor here today.  She denies any recent fevers, any  discomfort, nausea, vomiting, shortness of breath, chest pain.  She  states she bruises easily because she is on Coumadin.   PAST MEDICAL HISTORY:  The patient has a history of anxiety and panic  attacks, and states she also suffers from claustrophobia.  She has  atrial fibrillation status post fall, hypertension, hyperlipidemia,  family history of coronary  artery disease related to hyperlipidemia.  She is an active smoker, ongoing tobacco abuse, diabetes, history of  mitral valve prolapse, pneumonia in January 2008, right bundle branch  block, degenerative joint disease with chronic back pain and ganglion  cyst of the right wrist with resulting numbness in 2 fingers.   CURRENT MEDICATIONS:  1. Fentanyl patch 50 mcg every 3 days.  2. Vicodin 10/650 b.i.d. p.r.n.  3. Xanax 0.25 mg b.i.d.  4. Amaryl 2 mg 1/2 tablet b.i.d.  5. Nabumetone 750 mg b.i.d.  6. Paxil 40 mg a day.  7. Altace 10 mg a day.  8. Zocor 40 mg a day.  9. Cartia XT 360 mg a day.  10.Restoril 30 mg nightly.  11.Zetia 10 mg a day.  12.Dipyridamole 50 mg b.i.d.   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  She has approximately a 40-year history of  tobacco use.  She drinks alcoholic beverages daily but states that she  does not suffer any negative consequences if she skips a couple of days.  She is estranged from her husband, the couple was 15 months separated,  then due to financial difficulties with her husband's business, he moved  back into the marital home.  The patient herself states that the  situation is tense.  She is also concerned that her husband has voiced  comments that could be interpreted as planning to commit suicide.   PHYSICAL EXAMINATION:  VITAL SIGNS:  The patient is afebrile,  temperature of 98.1.  She has blood pressures of 115/64, pulse rate  varies greatly between 67 and 90, respiratory rate is at rest 22, O2  saturation on room air, however, was 97%.  GENERAL:  She is a well-developed elderly female, appears well-groomed  pleasant, cooperative in no acute distress, talkative.  NECK:  There is no lymphadenopathy, no goiter.  LUNGS:  She has lungs clear to auscultation.  No wheezing.  No rash.  The only lesions are noted  over the right arm.  ABDOMEN:  Soft and nontender abdomen.  MUSCULOSKELETAL:  Paraspinal tenderness at the L4-L5 level, joint  deformity on her right wrist.  NEUROLOGIC:  Alert and oriented.   Mental status examination shows the patient to be anxious, slightly  trembling in her left arm.  She is talkative, but she is tangential in  her thinking.  She is not able to name today's date.  She does know the  president.  She cannot perform serial 7s, asked to name as many animals  as come to mind in 60 seconds, she only named 10 and many of them  multiple times over. Cranial nerve examination shows full extraocular  movements.  No nystagmus.  Visual field appears to be intact.  The  patient has macular pallor in both eyes.  No facial asymmetry.  Tongue  and uvula midline.  Pupils reactive equal.  Motor examination shows the  right arm to be in the cast but with preserved good grip strength.  Bilateral lower extremity has symmetric reflexes at 1+.  She has an  increased tone in her left arm which is of waxy resistance.  No  cogwheeling is noted.  Passive range of motion reveals tenderness over  the right shoulder, left shoulder, and right hip.  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  Finger-to-nose test of the  left was dysmetric but not ataxic and did not appear to be impaired by a  tremor.   LABORATORY RESULTS:  Showed INR of 2.4, PT of 24.2 for this Coumadin  patient.  She had recently active vaginal bleeding and was scheduled for  today to have  Pap smear at [facility].  She is also diagnosed with  a UTI and takes Bactrim DS for 8 days.  EKG is atrial fib and flutter  with rates between 60 and 90 today, but in the past as high as 124.   ASSESSMENT:  Confused patient with possible underlying mild dementia,  preserved level of alertness, somewhat hypervigilant which would fit to  an anxiety  or panic disorder, but is also seen in delirium.  This  delirium could have happened secondary to multiple strokes, however,  there is no focal abnormality noted that would at this time support this  diagnosis.  Secondary delirium due to EtOH is a possibility.  I would  like to check the LFTs, gamma-GT, MCV, and reticulocytes.  I also  consider that the diabetes mellitus and some of the medications  including the pain medications can cause delirium especially if abruptly  increased or discontinued.  To differentiate the reason for the  patient's confusional state, I would like to order an EEG and then an  MRI of the brain, this can be performed without gadolinium.  I ordered a  vitamin complex to be taken p.o. daily, B1, B6, B12, and folic acid.  I  suggested Seroquel to her primary nurse here on the ward which at low  doses is mainly an antihistaminergic sleep help, 25 mg in a geriatric  population is often enough to induce good sleep without sundowning.  I  would try this medication for only 10-12 days but not on a continuum  given the patient's history of coronary artery disease and diabetes  mellitus.  In addition, I would like to add that the patient has a  daughter and stepdaughter, both healthy.  She states that her mother  died at 91.  Her father died at 31 of a cancer.  She has 1 brother and 1  sister that she states are healthy and do not have diabetes, coronary  artery disease, or any neurodegenerative diseases.   I thank Dr. Graciela Husbands for allowing me to participate in this pleasant lady's  care.  I would also like to have the delirium nurse involved as we have  a pathway for delirium patients established here at [facility].      Melvyn Novas, M.D.  Electronically Signed     CD/MEDQ  D:  09/11/2007  T:  09/12/2007  Job:  [id number]   cc:   Ellin Saba., MD  Duke Salvia, MD, [facility]

## 2010-09-28 NOTE — Assessment & Plan Note (Signed)
[facility]                            CARDIOLOGY OFFICE NOTE   [name], BURRY                          MRN:          [id number]  DATE:09/05/2007                            DOB:          1932-11-09    PRIMARY CARDIOLOGIST:  Duke Salvia, MD.   PRIMARY CARE DOCTOR:  Tawny Asal, MD   This is a 77 year old white female patient who was admitted to [facility] with atrial flutter and fibrillation and was scheduled for DC  cardioversion.  When she underwent the TEE by Dr. Graciela Husbands, she converted  spontaneously.  She was discharged home on [place] and has not had any  problems with palpitations since.  She denies any cardiac complaints.   CURRENT MEDICATIONS:  1. Amaryl 1 mg b.i.d.  2. Zocor 40 mg nightly.  3. Paxil 40 mg daily.  4. Lovenox 60 mg subcutaneous b.i.d.  5. Coumadin 5 mg.  6. Cartia XT 360 mg daily.  7. Zetia 10 mg daily.  8. __________ 50 mcg every third day.  9. Vicodin 10/06 0 mg t.i.d.  10.Altace 10 mg daily.  11.She was started on an antibiotic today by Dr. Scotty Court for a      Klebsiella and Citrobacter UTI.   PHYSICAL EXAM:  This is a pleasant 77 year old white female in no acute  distress.  Blood pressure 97/62, pulse 90, weight 145.  NECK:  Without JVD, HJR, bruit or thyroid enlargement.  LUNGS:  Clear anterior, posterior and lateral.  HEART:  Regular rate and rhythm at 90 beats per minute.  Normal S1 and  S2.  No murmur, rub, bruit, thrill or heave noted.  ABDOMEN:  Soft without organomegaly, masses, lesions or abnormal  tenderness.  EXTREMITIES:  Without cyanosis, clubbing or edema.  She has good distal  pulses.   IMPRESSION:  1. Atrial flutter/fibrillation, converted spontaneously to sinus      rhythm as the transesophageal echocardiogram was put in place.  2. Left bundle branch block.  3. Hypertension.  4. Diabetes mellitus.  5. Hyperlipidemia.  6. Severe degenerative joint disease with severe spinal  disease.  7. Macular degeneration.  8. History of mitral valve prolapse.  9. Urinary tract infection, being treated.  10.Ongoing tobacco abuse.   PLAN AT THIS TIME:  The patient is maintaining sinus rhythm.  We will  continue her Coumadin.  She is going to have a LifeTime monitor placed  today and she will see Dr. Graciela Husbands back in 1 month.      Jacolyn Reedy, PA-C  Electronically Signed      Luis Abed, MD, [facility]  Electronically Signed   ML/MedQ  DD: 09/05/2007  DT: 09/05/2007  Job #: [id number]   cc:   Ellin Saba., MD

## 2010-09-28 NOTE — Assessment & Plan Note (Signed)
[facility]                            CARDIOLOGY OFFICE NOTE   ARIBA, LEHNEN                          MRN:          [id number]  DATE:09/05/2007                            DOB:          19-Feb-1933    PRIMARY CARDIOLOGIST:  Dr. Graciela Husbands.   PRIMARY CARE DOCTOR:  Dr. Scotty Court.   Ms. Curl is a very pleasant 77 year old white female patient of Dr.  Graciela Husbands, who was admitted to [facility] for recurrent atrial  fibrillation and flutter.  She was to undergo Cardioversion, but when  Dr. Graciela Husbands put the TEE down, she spontaneously converted to normal sinus  rhythm.  The patient was started on Coumadin and low molecular-weight  heparin and was discharged home with the addition of Cartia.  Since she  has been home, she has not had any further palpitations.  She is very  debilitated, because of her spinal disease and says she feels bad in  general all the time, but as far as her heart is concerned, she seems to  be stable.  She also was diagnosed with UTI from a urine that was done  in our office, and it was positive for Klebsiella.   Dictation Ended At This [facility], PA-C       Luis Abed, MD, [facility]    ML/MedQ  DD: 09/05/2007  DT: 09/05/2007  Job #: [id number]   cc:   Ellin Saba., MD

## 2010-09-28 NOTE — Op Note (Signed)
NAMESARYA, LINENBERGER                 ACCOUNT NO.:  [id number]   MEDICAL RECORD NO.:  [id number]          PATIENT TYPE:  INP   LOCATION:  4731                         FACILITY:  [facility]   PHYSICIAN:  Mark C. Ophelia Charter, M.D.    DATE OF BIRTH:  01/18/1933   DATE OF PROCEDURE:  09/07/2007  DATE OF DISCHARGE:                               OPERATIVE REPORT   PREOPERATIVE DIAGNOSIS:  Right closed elbow dislocation, irreducible.   POSTOPERATIVE DIAGNOSIS:  Right closed elbow dislocation.   PROCEDURE:  Closed reduction, right elbow under general anesthesia.   This 77 year old female, complex history who is in atrial fibrillation  with a rapid ventricular rate, is on Coumadin, has had questionable  recent MI, is a chronic pain patient with fentanyl patch, on  antihypertensive medicines, hyperlipidemia; was on herself was rolling  off to get up, stepped between the carpet and herself was slipping on  the floor, fell forward and outstretched right arm and dislocated her  elbow with posterolateral dislocation.  It was attempted and reduced by  the ER physician once and by me twice under etomidate downstairs in the  emergency room, which was unsuccessful.  She was brought up to the  operating room for attempt to closed reduction.  She was on Coumadin and  is not a candidate for open procedure due to her anticoagulation status.  She was switching between a sinus rhythm with a rate of 80 and atrial  fibrillation with a rapid ventricular rate intermittently while in the  emergency room as well as in the operating room.   The patient underwent with use of paralyzing agent, endotracheal  intubation, and the tube was being taped while traction was applied and  with some difficulty, the elbow was reduced in about 20-30 degrees  flexion.  She was first positioned where the olecranon was in line with  the humerus, and then it was popped over the edge, reduction with a  clunk, radial head was reduced with the  elbow dislocation.  The patient  had a radial head fracture, which was nondisplaced and it was checked  under fluoroscopy with the elbow flexed in 90 degrees.  The elbow was  brought up to 45 degrees, it did not re-dislocate, so it was placed back  in 90 degrees.  With the patient on Coumadin, I did not test her  collaterals and bring it further out into extension.  A 90-degree spot  film was taken showing concentric reduction.  Posterior splint was  applied after padding.  A sling was applied and the patient was  transferred to the recovery room.  Cardiology would be called for  treatment with IV medication for correction of her atrial fibrillation  problem.      Mark C. Ophelia Charter, M.D.  Electronically Signed     MCY/MEDQ  D:  09/07/2007  T:  09/07/2007  Job:  [id number]

## 2010-09-28 NOTE — Procedures (Signed)
EEG NUMBER:  [id number].   REQUESTING PHYSICIAN:  Jonah Blue, MD   ATTENDING PHYSICIAN:  Duke Salvia, MD, [facility]   CLINICAL HISTORY:  A 77 year old woman admitted status post fall, now  with delirium.  EEG is performed for evaluation.   This is a routine EEG done with photic stimulation, but not  hyperventilation, the patient describes awake and drowsy.   DESCRIPTION:  The dominant background rhythm this tracing is a moderate  amplitude theta rhythm with 7-8 Hz which appears diffusely, more  posteriorly, without abnormal asymmetry.  More diffuse slower theta  range rhythms at 5-6 Hz were seen intermittently in the frontal and  central areas, along with low amplitude fast activity which appears in  the frontal areas.  No focal slowing is noted and no epileptiform  discharges seen.  No architecture of sleep is seen.  Photic stimulation  did not produce significant driving response.  Hyperventilation was not  performed.  Single channel devoted EKG revealed sinus rhythm throughout  the rate approximately 66 beats per minute.   CONCLUSIONS:  Abnormal study due to the presence of mild-to-moderate  diffuse slowing and disorganization in the background rhythms, findings  suggestive of diffuse widespread cerebral dysfunction, consistent with a  history of an encephalopathic and/or demented state.  No focal slowing  is noted and no epileptiform discharges were seen.      Michael L. Thad Ranger, M.D.  Electronically Signed     [id number]  D:  09/12/2007 17:18:16  T:  09/13/2007 02:10:34  Job #:  [id number]

## 2010-09-28 NOTE — H&P (Signed)
[name], [name]                 ACCOUNT NO.:  [id number]   MEDICAL RECORD NO.:  [id number]          PATIENT TYPE:  INP   LOCATION:  4731                         FACILITY:  [facility]   PHYSICIAN:  Veverly Fells. Excell Seltzer, MD  DATE OF BIRTH:  1932/07/08   DATE OF ADMISSION:  09/07/2007  DATE OF DISCHARGE:                              HISTORY & PHYSICAL   PRIMARY CARDIOLOGIST:  Madolyn Frieze. Jens Som, MD, [facility]   ELECTROPHYSIOLOGIST:  Duke Salvia, MD, [facility]   PRIMARY CARE PHYSICIAN:  Tawny Asal, MD   REASON FOR ADMISSION:  Right elbow dislocation and atrial fibrillation  with RVR.   HISTORY OF PRESENT ILLNESS:  [name] is a 77 year old woman who  apparently was getting up from her couch late last night and fell to the  ground sustaining a right elbow dislocation.  She underwent closed  reduction of the right elbow under general anesthesia early this morning  by Dr. Ophelia Charter.  She is being admitted to our service because of atrial  fibrillation with RVR.  [name] has a history of paroxysmal atrial fib.  She has been followed in our office and recently was seen by Wende Bushy.  She is maintained on anticoagulation with Coumadin.   It is very difficult to obtain any history at this point as the patient  is somnolent after anesthesia.  She is able to tell me that she did not  have syncope this morning.  She got up from the couch and fell to the  ground because of dizziness.  She currently denies chest pain, dyspnea,  or any complaints.  She continuously nods off during the history, so I  am unable to gather any more information.   HOME MEDICATIONS:  1. Amaryl 1 mg b.i.d.  2. Zocor 40 mg at bedtime.  3. Paxil 40 mg daily.  4. Coumadin as directed.  5. Cartia XT 360 mg daily.  6. Zetia 10 mg daily.  7. Fentanyl patch 50 mcg changed every third day.  8. Vicodin 10 mg t.i.d. as needed.  9. Altace 10 mg daily.   ALLERGIES:  CODEINE, METHADONE, and OXYCONTIN.   PAST MEDICAL  HISTORY:  1. Pertinent for atrial fib/flutter on chronic anticoagulation.  2. Mild LV dysfunction by TEE in March 2009, EF was not quantitated.  3. Mitral valve prolapse.  4. Essential hypertension.  5. Type 2 diabetes.  6. Hyperlipidemia.  7. Severe degenerative joint disease and chronic back pain requiring      chronic narcotics.  8. Macular degeneration.  9. Chronic left bundle branch block.   SOCIAL HISTORY:  The patient lives in [place] with her husband.  She  smokes cigarettes and drinks alcohol daily.  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   FAMILY HISTORY:  Mother died at age 60, father died at age 55, details  unknown.  She does have a sister that is alive and also has atrial  fibrillation.   REVIEW OF SYSTEMS:  Notable for chronic back pain and postmenopausal,  chronic dyspnea on exertion that is unchanged,  unable to obtain further  review of systems.   PHYSICAL EXAM:  GENERAL:  The patient is asleep, but easily arousable.  She is appropriate when she wakes up, but continuously falls asleep as  noted above.  She moves all extremities to command.  VITAL SIGNS:  Blood pressure 154/86, heart rate currently in the 90s,  respiratory rate 14, and oxygen saturation 93%.  HEENT:  Normal.  NECK:  Normal.  Carotid upstrokes without bruits.  Jugular venous  pressure normal.  No thyromegaly or thyroid nodules.  CARDIOVASCULAR:  The heart is irregularly irregular without murmurs or  gallops.  The apex is discrete and nondisplaced.  LUNGS:  Show bilateral inspiratory rales, I believe from the upper  airways.  ABDOMEN:  Soft and nontender.  No organomegaly.  EXTREMITIES:  No clubbing, cyanosis, or edema.  The right arm is in a  sling.  Peripheral pulses are 2+ and equal.  SKIN:  Warm and dry without rash.  LYMPHATICS:  No adenopathy.  NEUROLOGIC:  The patient is somnolent, but arouses appropriately.  Cranial nerves II through XII are grossly  intact.  Strength is 5/5 and  equal in all extremities.   Chest x-ray shows cardiomegaly with left basilar atelectasis.   EKG from the PACU showed atrial fib with RVR and a left bundle branch  block, which is known to be chronic.   ASSESSMENT:  1. Atrial fibrillation with rapid ventricular response, currently rate      controlled on intravenous Cardizem.  2. Acute right shoulder dislocation status post closed reduction under      general anesthesia.  3. Chronic anticoagulation with Coumadin.  4. Essential hypertension  5. Mild left ventricular dysfunction.   PLAN:  We will continue IV diltiazem at present.  Also, we will add in  IV metoprolol for better rate control to help blunt postanesthesia  catecholamine response.  We will continue Coumadin per pharmacy.  Determine whether further workup is needed as the patient wakes up and  is able to provide more history.      Veverly Fells. Excell Seltzer, MD  Electronically Signed     MDC/MEDQ  D:  09/07/2007  T:  09/07/2007  Job:  [id number]   cc:   Ellin Saba., MD  Veverly Fells Ophelia Charter, M.D.  Duke Salvia, MD, [facility]  Madolyn Frieze. Jens Som, MD, [facility]

## 2010-09-28 NOTE — Discharge Summary (Signed)
NAMEKENSEY, [name]                 ACCOUNT NO.:  [id number]   MEDICAL RECORD NO.:  [id number]          PATIENT TYPE:  INP   LOCATION:  4731                         FACILITY:  [facility]   PHYSICIAN:  Maple Mirza, PA   DATE OF BIRTH:  [birth year]   DATE OF ADMISSION:  09/07/2007  DATE OF DISCHARGE:  09/18/2007                               DISCHARGE SUMMARY   ALLERGIES:  This patient has allergies to CODEINE, METHADONE and  OXYCONTIN.   FINAL DIAGNOSES:  1. Admitted with slip and fall.      a.     Posterolateral right elbow dislocation on admission.      b.     Discharging day #11, status post external reduction of the       right elbow dislocation by Dr. Ophelia Charter.  2. Atrial fib/flutter converted to sinus rhythm, hospital day #6 here.      a.     Diltiazem 240 mg daily for rate control.      b.     Metoprolol 25 mg b.i.d. for rate and rhythm control.      c.     Sinus rhythm on discharge.      d.     Coumadin.  INR is 1.9 on discharge.  3. Klebsiella urinary tract infection on admission, treated with Cipro      for an 8-day course.  4. Apparent active vaginal bleeding.  A Pap smear is scheduled for      [facility] on September 10, 2007 was cancelled.  She will follow up with      [facility] [place].  5. Delirium and confusion.      a.     Question of ethanol withdrawal.      b.     Question of benzodiazepine withdrawal.      c.     Resolved at discharge.      d.     There may be an element of what they call, ICD delirium.      e.     Neuro consult.  Electroencephalogram shows no epileptiform       discharges.  There was diffuse slowing/disorganization suggestive       of encephalopathy and/or demented state.  The MRI of the brain on       September 13, 2007 negative for subacute infarct, negative for       ischemic insult, age related atrophy.  6. Debilitation secondary to prolonged bed rest.  7. Social stressors, family concerns.  8. Ongoing tobacco, ongoing ethanol  abuse.   SECONDARY DIAGNOSES:  1. Diabetes.  2. Hypertension.  3. Osteoarthritis.    NOTE:  The patient has recurrence of atrial fib/flutter, flecainide  could be tried as an outpatient.   PROCEDURE:  1. September 07, 2007.  External reduction of right elbow dislocation by      Dr. Ophelia Charter.  2. Electroencephalogram, ordered by neurology as mentioned above.  No      epileptiform discharges, but diffuse slowing/disorganization      suggestive of encephalopathy and/or demented state.  3. MRI  brain on September 13, 2007, no subacute infarct.  No ischemic      insult, age-related atrophy.   BRIEF HISTORY:  Ms. [name] is a 77 year old female.  She was transported  by EMS to the emergency room on April 24, complaining of right elbow  pain.   The patient was rising from the couch late in the evening, as she was  arising from the couch late in the evening, she slipped and fell  twisting her right arm behind and up into the subclavicular area.  At  the emergency room, it was noted that the patient was tachycardic.  Electrocardiogram at 4:50 a.m. on April 24 showed atrial fibrillation,  rapid ventricular rate.  The patient underwent external reduction of a  right elbow dislocation.  A splint has been placed, and this will be  removed in 10-14 days.   HOSPITAL COURSE:  The patient was admitted to the emergency room on  April 24 with right elbow posterolateral dislocation.  She was in atrial  fibrillation in the emergency room, but by noon on the day of admission,  she was in an atrial flutter, rates over the 80s to 90s.  In addition to  her elbow dislocation, she had several secondary issues, one which was  an active Klebsiella urinary tract infection.  She had already completed  3 days of Bactrim.  This was changed to Cipro this admission, and she  completed a 7-day course with resolution of the infection.  Her atrial  fibrillation was treated with rate control medications including IV  Cardizem  and metoprolol.  On hospital day #6 here, she actually  converted in the early morning hours to sinus rhythm and she has  maintained sinus rhythm on the diltiazem, metoprolol regimen for the  next 6 days of her stay here.  On hospital day #4, the patient became  confused with delirium and labile emotional mood swings.  A neuro  consult was obtained.  They ordered an electroencephalogram and MRI of  the brain as dictated above.  The patient and her history has several  causative factors even when one subtracts the disorientation, which  hospital stay might be causing.  Number 1 might be her consumption of  alcohol in the past, and number 2 fairly high consumption of  benzodiazepines, which includes Valium 3 times daily and temazepam at  night.  Her SGOT was mildly elevated at 44.  Her GGT was elevated 130.  Over the next 6 days, her delirium has improved and confusion has  improved to a great extent.  In fact, it has actually resolved.  The  patient's mood has also improved.  She is bright and cheery,  communicative, and also quite conversant with improvement in her mental  status.  Her ability to undertake physical therapy here has improved,  and she is walking daily for the last 3 days with cardiac rehab.  There  was some thought that the patient would need additional help in a  skilled nursing facility, but by the time of her discharge on Sep 18, 2007, it was decided that physical therapy home health RN and home  health aides could support her in her home environment.  The discharges  was on Sep 18, 2007 on the following medication:  1. Glimepiride 1 mg twice daily.  2. Paroxetine 20 mg daily.  3. Simvastatin 80 mg daily, a new higher dose.  4. Diltiazem 240 mg daily, a new lower dose.  The patient will  take      120 mg twice daily at home due to availability generically.  5. Metoprolol 25 mg, 1 tablet in the morning and 1 tablet in the      evening.  This is a new medication.  6.  Altace 5 mg daily, a new lower dose.  7. Coumadin 7.5 mg daily.  This is one and one-half 5-mg tablets or as      directed by the [facility].  8. Temazepam 30 mg daily at bedtime.  9. Vicodin 5/500, 1-2 tablets every 6 hours as needed for pain.   1. The patient is asked to stop hydralazine, stop hydrochlorothiazide,      stop Zetia, and stop Diazepam.  She has a followup with home health      on Thursday, Sep 20, 2007.  They will draw a protime and send the      results to the [facility] for analysis.  2. She will see Dr. Ophelia Charter on Tuesday, Oct 02, 2007 at 10 o'clock.  If      first one is not comfortable, she is asked to call [phone] to      arrange a refitting.  3. To see Dr. Graciela Husbands Tuesday, October 16, 2007 at 3:15.  She is to call his      office earlier if she has a recurrence of her fluttery feelings,      and she was instructed that if she has recurrence of atrial      fibrillation, she might be tried on flecainide.  She is asked to      call a gynecologist at [facility] at [place].  Dr.      Charmian Muff office recommended them.  Their telephone number is [phone].  The dosing schedule will be sent to the [facility] here      at [facility] [address], [place].  Once again, the      protime on day of discharge is 22.3, INR 1.9.  On Sep 16, 2007, the      complete blood count was white cells 7.8, hemoglobin 11.4,      hematocrit 34, platelets 270, 000.  Her serum electrolytes, sodium      141, potassium 3.1, chloride 109, carbonate 29, BUN is 5,      creatinine 0.57, and glucose 91.  At that time, the basic metabolic      panel was drawn to give a potassium of 3.1.  The patient received      80 mEq of potassium and the potassium level on Sep 17, 2007 was 4.      Maple Mirza, PA     GM/MEDQ  D:  09/18/2007  T:  09/19/2007  Job:  [id number]   cc:   Ellin Saba., MD  Duke Salvia, MD, [facility]  Mark C. Ophelia Charter, M.D.  Cynthia P.  Tresa Res, M.D.  Freddy Finner, M.D.  Dineen Kid Rana Snare, M.D.

## 2010-09-28 NOTE — Assessment & Plan Note (Signed)
[facility]                         ELECTROPHYSIOLOGY OFFICE NOTE   [name], [name]                          MRN:          [id number]  DATE:10/16/2007                            DOB:          [birth year]    [name] was recently hospitalized after she fell and required  orthopedic surgery.  She was found to be in atrial fibrillation which  subsequently converted spontaneously.  It was quite rapid as I recall  from hospital.   Since having gone home, she felt really good for a few weeks and then  has had problems in the mornings when she felt like there was something  difficult with her breathing.  It is very nebulous.  There was also a  sensation that her heartbeat was not entirely regular.  She mentioned  that she thought she was in it at the moment, and, in fact,  electrocardiogram was clarifying demonstrating atrial fibrillation with  a controlled ventricular response.   She had in the remote past been treated with Inderal that gave her the  sensation that her legs were about ready to fall off.  She noted this  morning that she was having a similar sensation; she wondered whether or  not it was attributable to the metoprolol.  She also takes diltiazem 240  mg, but she does not know whether she takes it at in divided doses or  once a day. She takes Zocor 80, Paxil 20, Vicodin 10/650, and Coumadin  as well as Amaryl.   PHYSICAL EXAMINATION:  VITAL SIGNS:  Her blood pressure is 116/72 with a  pulse of 69.  LUNGS:  Clear.  CARDIAC:  Her heart sounds were irregular with a 1/6 systolic murmur.  ABDOMEN:  Soft.  EXTREMITIES:  Trace edema.   Electrocardiogram confirms atrial fibrillation with a rate of 82 with  intervals of 0.12/0.44 with a QTC of 0.51, and the QRS axis was -60.   IMPRESSION:  1. Paroxysmal atrial fibrillation, now persisting.  2. Recurrent morning symptoms, likely related to inadequate rate      control.  3. Confusion related  to medications.  4. Previous evidence of in-hospital confusion or dimension.  5. Thromboembolic risk factors notable for:      a.     Hypertension.      b.     Diabetes.   DISCUSSION:  [name] is taking Coumadin and tolerating it well.  She  has now reverted to atrial fibrillation, and we will plan to leave her  in atrial fibrillation, working for rate control.  Will not be able to  tolerate a beta blocker and will discontinue it today.  She is to call  us to let us  know exactly what her Cardizem dose is, and we will adjust if we can,  and if not, we will add Lanoxin for augmented rate control.     Toni Salvia, MD, [facility]  Electronically Signed    SCK/MedQ  DD: 10/16/2007  DT: 10/16/2007  Job #: [id number]   cc:   [name]., MD

## 2010-09-28 NOTE — Assessment & Plan Note (Signed)
[facility]                         ELECTROPHYSIOLOGY OFFICE NOTE   ALLANNAH, KEMPEN                          MRN:          [id number]  DATE:07/11/2008                            DOB:          Sep 17, 1932    Ms. Lemarr is seen in followup for paroxysmal atrial fibrillation.  I was  sitting at my desk dictating when I heard help screamed from her room  a couple of times.  At that point, she went to sit in the chair.  She  described events recently where she has been aware of her heartbeat and  one of these days she awakens in the middle of the night, very short of  breath, and very discombobulated.  By morning, she could no longer feel  her heart beat and she felt wonderful.   It was her impression that the nighttime was not associated with atrial  fibrillation; however, she does describe being aware of her heartbeat  with this.  She has antecedent history of atrial fibrillation with a  rapid ventricular response.  She currently takes diltiazem for rate  control and has been demonstrated reasonably controlled rate on the  diltiazem.   1. This doses are 120 b.i.d.  2. She also takes Amaryl one b.i.d.  3. Coumadin.  4. Fentanyl.  5. Zocor.  6. Paxil.  7. Altace.   On examination, today her blood pressure was 130/64, her pulse was 51  and regular.  Lungs were clear.  The heart sounds were regular without  murmurs, rubs, or gallops.  Abdomen was soft.  Extremities were without  edema.  Her gait was quite unstable.  She was using a cane.   Electrocardiogram dated today demonstrated sinus rhythm at 51 with  intervals of 0.15/0.12/0.46, the axis was leftward -30.   There is poor R-wave progression.  The electrocardiogram was not  significantly changed.   IMPRESSION:  1. Paroxysmal atrial fibrillation previously with a rapid rate, more      recently with a controlled rate.  2. Recent episode with fatigue and shortness of breath associated with  palpitations, all the beatings are rapid.  3. Hypertension.  4. Diabetes.  5. Fatigue, question related to diltiazem.   Ms. Ravelo symptoms were quite striking the other night.  The rapid  resolution and the associated palpitations may be wonder whether it was  not related to atrial fibrillation.  To that end, try to demonstrate  this with the frequency.  These episodes have been a couple of times a  month.  We will use outpatient telemetry monitoring.  I will also  discontinue her diltiazem and replace with verapamil 120 mg twice daily.  We have discussed the potential side effects particularly of  constipation.   We will plan to see her again in 4 weeks' time at the end of her  monitoring period to see what it shows.     Duke Salvia, MD, [facility]  Electronically Signed    SCK/MedQ  DD: 07/11/2008  DT: 07/12/2008  Job #: [id number]

## 2010-09-29 ENCOUNTER — Ambulatory Visit (INDEPENDENT_AMBULATORY_CARE_PROVIDER_SITE_OTHER): Payer: Medicare Other | Admitting: Family Medicine

## 2010-09-29 ENCOUNTER — Encounter: Payer: Self-pay | Admitting: Family Medicine

## 2010-09-29 DIAGNOSIS — E7800 Pure hypercholesterolemia, unspecified: Secondary | ICD-10-CM

## 2010-09-29 DIAGNOSIS — R350 Frequency of micturition: Secondary | ICD-10-CM

## 2010-09-29 DIAGNOSIS — I10 Essential (primary) hypertension: Secondary | ICD-10-CM

## 2010-09-29 DIAGNOSIS — E559 Vitamin D deficiency, unspecified: Secondary | ICD-10-CM

## 2010-09-29 DIAGNOSIS — E039 Hypothyroidism, unspecified: Secondary | ICD-10-CM

## 2010-09-29 LAB — LIPID PANEL
HDL: 35.6 mg/dL — ABNORMAL LOW (ref >39.00)
LDL Cholesterol: 49 mg/dL (ref 0–99)
Total CHOL/HDL Ratio: 3
Triglycerides: 145 mg/dL (ref 0.0–149.0)
VLDL: 29 mg/dL (ref 0.0–40.0)

## 2010-09-29 LAB — POCT URINALYSIS DIPSTICK
Bilirubin, UA: NEGATIVE
Glucose, UA: NEGATIVE
Ketones, UA: NEGATIVE
pH, UA: 6.5

## 2010-09-29 LAB — BASIC METABOLIC PANEL
BUN: 9 mg/dL (ref 6–23)
CO2: 28 mEq/L (ref 19–32)
Chloride: 100 mEq/L (ref 96–112)
Creatinine, Ser: 0.6 mg/dL (ref 0.4–1.2)
Potassium: 5.4 mEq/L — ABNORMAL HIGH (ref 3.5–5.1)

## 2010-09-29 LAB — HEPATIC FUNCTION PANEL
AST: 32 U/L (ref 0–37)
Albumin: 3.9 g/dL (ref 3.5–5.2)
Alkaline Phosphatase: 69 U/L (ref 39–117)
Bilirubin, Direct: 0.1 mg/dL (ref 0.0–0.3)

## 2010-09-29 MED ORDER — FENTANYL 50 MCG/HR TD PT72: 1.0000 | MEDICATED_PATCH | TRANSDERMAL | Status: DC

## 2010-09-29 MED ORDER — OXYCODONE-ACETAMINOPHEN 10-650 MG PO TABS: 1.0000 | ORAL_TABLET | Freq: Four times a day (QID) | ORAL | Status: DC | PRN

## 2010-09-30 ENCOUNTER — Ambulatory Visit (INDEPENDENT_AMBULATORY_CARE_PROVIDER_SITE_OTHER): Payer: Self-pay | Admitting: Internal Medicine

## 2010-09-30 DIAGNOSIS — R0989 Other specified symptoms and signs involving the circulatory and respiratory systems: Secondary | ICD-10-CM

## 2010-10-01 ENCOUNTER — Other Ambulatory Visit: Payer: Self-pay

## 2010-10-01 ENCOUNTER — Encounter: Payer: Self-pay | Admitting: Family Medicine

## 2010-10-01 MED ORDER — CIPROFLOXACIN HCL 500 MG PO TABS: 500.0000 mg | ORAL_TABLET | Freq: Two times a day (BID) | ORAL | Status: AC

## 2010-10-01 NOTE — Patient Instructions (Signed)
I recommend that you do not restart drinking alcohol Continue her regular medications We'll call regarding lab studies and especially hemoglobin A1c

## 2010-10-01 NOTE — Progress Notes (Signed)
  Subjective:    Patient ID: [name], female    DOB: June 06, 1932, 77 y.o.   MRN: [id number] Is a 77 year old white married female is in discuss her multiple medical problems which involve diabetes type 2 chronic low back pain some disc considerable pain. She was hospitalized it was a long hospital approximately one month ago with very he'll and he has had withdrawal symptoms from alcohol while in the hospital she was discharged to every 8 absent or in [place] for one week and then discharged home her she is seeing a therapist  at home ,[facility] She continues to need thin female and oxycodone her diabetes has not been well-controlled and we'll do lab studies today and then discuss with her necessary treatment patient has COPD but continues to smoke .Needs temazepam  30 mgbedtime to sleep  her husband is her caretaker but there marriage is rather rocky she has atrial fibrillation and is under the care of a cardiologist as well as being on CoumadinHPI    Review of Systems see history of present illness   Objective:   Physical Exam the patient is a well well worse pleasant white female HEENT negative carotids palpable thyroid nonpalpable Breasts no masses palpable axilla clear Lungs decreased breath sounds bilaterally occasional rales no dullness no wheezes Abdomen liver spleen kidneys are nonpalpable no masses felt bowel sounds normal Pelvic examination not done Extremities negative Spine tenderness over the LS spine as well as both SI joints Patient has difficulty walking and requires wheelchair or walker        Assessment & Plan:

## 2010-10-01 NOTE — Progress Notes (Signed)
Quick Note:  Pt is aware. ______ 

## 2010-10-01 NOTE — Telephone Encounter (Signed)
rx sent to pharmacy and pt is aware. 

## 2010-10-01 NOTE — Assessment & Plan Note (Signed)
[facility]                                 ON-CALL NOTE   [name], [name]                          MRN:          [id number]  DATE:07/13/2008                            DOB:          11-05-1932    Page from [place] at [employer].  Patient of Dr. Odessa Fleming.   The patient is wearing a Passenger transport manager.  The patient is  asymptomatic, experiencing episodes of atrial fib with pauses noted,  discussed with Dr. Graciela Husbands who is familiar with the patient.  Rhythm  strips have been faxed to the office.  Dr. Graciela Husbands will have his nurse get  in touch with the patient on Monday for pacemaker consideration as the  patient has had episodes of ongoing fatigue and shortness of breath  associated with palpitations, but denies any currently.      Dorian Pod, ACNP  Electronically Signed      Duke Salvia, MD, [facility]  Electronically Signed   MB/MedQ  DD: 07/13/2008  DT: 07/13/2008  Job #: [id number]

## 2010-10-08 ENCOUNTER — Encounter: Payer: Medicare Other | Admitting: *Deleted

## 2010-10-12 ENCOUNTER — Other Ambulatory Visit: Payer: Self-pay | Admitting: *Deleted

## 2010-10-12 ENCOUNTER — Other Ambulatory Visit: Payer: Self-pay | Admitting: Family Medicine

## 2010-10-12 ENCOUNTER — Ambulatory Visit (INDEPENDENT_AMBULATORY_CARE_PROVIDER_SITE_OTHER): Payer: Medicare Other | Admitting: *Deleted

## 2010-10-12 ENCOUNTER — Telehealth: Payer: Self-pay

## 2010-10-12 DIAGNOSIS — I4891 Unspecified atrial fibrillation: Secondary | ICD-10-CM

## 2010-10-12 MED ORDER — RAMIPRIL 5 MG PO CAPS: 5.0000 mg | ORAL_CAPSULE | Freq: Every day | ORAL | Status: DC

## 2010-10-12 NOTE — Telephone Encounter (Signed)
Pt reported during visit today a new allergy/intolerance to morphine. Allergy updated in chart.

## 2010-10-14 ENCOUNTER — Other Ambulatory Visit: Payer: Self-pay | Admitting: *Deleted

## 2010-10-14 DIAGNOSIS — I10 Essential (primary) hypertension: Secondary | ICD-10-CM

## 2010-10-14 MED ORDER — RAMIPRIL 5 MG PO CAPS: 5.0000 mg | ORAL_CAPSULE | Freq: Every day | ORAL | Status: DC

## 2010-10-14 NOTE — Telephone Encounter (Signed)
Refill folic acid 

## 2010-10-21 ENCOUNTER — Other Ambulatory Visit: Payer: Self-pay | Admitting: Family Medicine

## 2010-10-26 ENCOUNTER — Ambulatory Visit (INDEPENDENT_AMBULATORY_CARE_PROVIDER_SITE_OTHER): Payer: Medicare Other | Admitting: *Deleted

## 2010-10-26 DIAGNOSIS — I4891 Unspecified atrial fibrillation: Secondary | ICD-10-CM

## 2010-10-27 ENCOUNTER — Other Ambulatory Visit: Payer: Self-pay | Admitting: Family Medicine

## 2010-11-10 ENCOUNTER — Ambulatory Visit (INDEPENDENT_AMBULATORY_CARE_PROVIDER_SITE_OTHER): Payer: Medicare Other | Admitting: *Deleted

## 2010-11-10 DIAGNOSIS — I4891 Unspecified atrial fibrillation: Secondary | ICD-10-CM

## 2010-11-10 MED ORDER — WARFARIN SODIUM 5 MG PO TABS: ORAL_TABLET | ORAL | Status: DC

## 2010-11-11 ENCOUNTER — Telehealth: Payer: Self-pay | Admitting: *Deleted

## 2010-11-11 MED ORDER — AZITHROMYCIN 250 MG PO TABS: 250.0000 mg | ORAL_TABLET | Freq: Every day | ORAL | Status: DC

## 2010-11-11 MED ORDER — HYDROCODONE-HOMATROPINE 5-1.5 MG/5ML PO SYRP: 5.0000 mL | ORAL_SOLUTION | ORAL | Status: AC | PRN

## 2010-11-11 NOTE — Telephone Encounter (Signed)
Pt states she was told to come in next week for A1C to be checked.  Appt scheduled for 11/18/10, however no future order.  Please enter order.

## 2010-11-11 NOTE — Telephone Encounter (Signed)
Called and pt is aware that an rx for hycodan syrup has been sent to pharmacy as well as zpak and pt's order for hga1C has been entered into the system.

## 2010-11-18 ENCOUNTER — Other Ambulatory Visit (INDEPENDENT_AMBULATORY_CARE_PROVIDER_SITE_OTHER): Payer: Medicare Other

## 2010-11-25 ENCOUNTER — Telehealth: Payer: Self-pay

## 2010-11-25 NOTE — Telephone Encounter (Signed)
Pt stated she needed her diazepam called in to the pharmacy because it was called in incorrectly.  Pharmacy called and pharmacist stated that pt has received diazepam 5 mg from Dr. Grayce Sessions.    Pt called and pt stated that prescription was given when she went in for rehab on her back. Pt is aware that both medications cannot come from both physicians.  Ok per Dr. Scotty Court to [facility] an [facility] d[facility]am 5 mg 120x0 once the prescription for the previous physician has expired.    Pt is aware.

## 2010-11-26 ENCOUNTER — Other Ambulatory Visit: Payer: Self-pay

## 2010-11-26 MED ORDER — TEMAZEPAM 30 MG PO CAPS: 30.0000 mg | ORAL_CAPSULE | Freq: Every evening | ORAL | Status: DC | PRN

## 2010-11-26 NOTE — Telephone Encounter (Signed)
rx for temazepam 30; ok per Dr. Scotty Court to fill x 5 refills.

## 2010-11-30 ENCOUNTER — Telehealth: Payer: Self-pay | Admitting: *Deleted

## 2010-11-30 DIAGNOSIS — M5137 Other intervertebral disc degeneration, lumbosacral region: Secondary | ICD-10-CM

## 2010-11-30 DIAGNOSIS — M545 Low back pain: Secondary | ICD-10-CM

## 2010-11-30 DIAGNOSIS — M129 Arthropathy, unspecified: Secondary | ICD-10-CM

## 2010-11-30 NOTE — Telephone Encounter (Signed)
Pt would a referral to a neurologist. Would like Alisha to call

## 2010-12-01 NOTE — Telephone Encounter (Signed)
This is for you [place]

## 2010-12-02 ENCOUNTER — Telehealth: Payer: Self-pay

## 2010-12-02 NOTE — Telephone Encounter (Signed)
Pt states she wants a referral to a neurologist Lelon Perla on [address]). Pt states there is a procedure that was done on her back at [facility].  It has been 1 year since pt seen Dr. Dutch Quint.  Pt states there have been lots of changes since she saw Dr. Dutch Quint last.  If there is someone else that Dr. Scotty Court would prefer, pt will go.  Pt states that cement is injected into her back and the procedure was very helpful. Pt has her films from Dr. Dutch Quint and Dr. Aldean Baker [facility]).  Pt would like Dr. Scotty Court to call if there are any questions or concerns.

## 2010-12-03 NOTE — Telephone Encounter (Signed)
Addended by: Azucena Freed on: 12/03/2010 03:51 PM   Modules accepted: Orders

## 2010-12-03 NOTE — Telephone Encounter (Signed)
A referral has been sent for a referral to [facility].

## 2010-12-06 ENCOUNTER — Telehealth: Payer: Self-pay | Admitting: Family Medicine

## 2010-12-06 DIAGNOSIS — M5137 Other intervertebral disc degeneration, lumbosacral region: Secondary | ICD-10-CM

## 2010-12-06 DIAGNOSIS — M545 Low back pain: Secondary | ICD-10-CM

## 2010-12-06 NOTE — Telephone Encounter (Signed)
Referral to Neurology       Pt to see Dr Julio Sicks, he is neurosurgery   Please confirm

## 2010-12-07 ENCOUNTER — Encounter: Payer: Medicare Other | Admitting: *Deleted

## 2010-12-09 ENCOUNTER — Telehealth: Payer: Self-pay | Admitting: *Deleted

## 2010-12-09 NOTE — Telephone Encounter (Signed)
Pt's medicaiton has already been sent to pharmacy on 11/26/2010 30x5rf

## 2010-12-09 NOTE — Telephone Encounter (Signed)
Refill temazepam for 6 mths to [facility]

## 2010-12-09 NOTE — Telephone Encounter (Signed)
done

## 2010-12-14 ENCOUNTER — Ambulatory Visit (INDEPENDENT_AMBULATORY_CARE_PROVIDER_SITE_OTHER): Payer: Medicare Other | Admitting: *Deleted

## 2010-12-14 ENCOUNTER — Telehealth: Payer: Self-pay

## 2010-12-14 DIAGNOSIS — I4891 Unspecified atrial fibrillation: Secondary | ICD-10-CM

## 2010-12-14 LAB — POCT INR: INR: 2.2

## 2010-12-14 NOTE — Telephone Encounter (Signed)
Pt called and stated she needed a refill on temazepam.

## 2010-12-17 ENCOUNTER — Other Ambulatory Visit: Payer: Self-pay

## 2010-12-17 ENCOUNTER — Telehealth: Payer: Self-pay

## 2010-12-17 MED ORDER — TEMAZEPAM 30 MG PO CAPS: 30.0000 mg | ORAL_CAPSULE | Freq: Every evening | ORAL | Status: DC | PRN

## 2010-12-17 NOTE — Telephone Encounter (Signed)
Pt called and stated she would [facility] of [facility].  Rx for [employer] cancelled and [facility]

## 2010-12-17 NOTE — Telephone Encounter (Signed)
Called [facility] and pt's temazepam had not been received.  rx called in to pharmacy.

## 2011-01-11 ENCOUNTER — Ambulatory Visit (INDEPENDENT_AMBULATORY_CARE_PROVIDER_SITE_OTHER): Payer: Medicare Other | Admitting: *Deleted

## 2011-01-11 DIAGNOSIS — I4891 Unspecified atrial fibrillation: Secondary | ICD-10-CM

## 2011-01-18 ENCOUNTER — Other Ambulatory Visit: Payer: Self-pay

## 2011-01-18 MED ORDER — DIAZEPAM 5 MG PO TABS: 5.0000 mg | ORAL_TABLET | Freq: Four times a day (QID) | ORAL | Status: DC | PRN

## 2011-01-18 NOTE — Telephone Encounter (Signed)
Ok per Dr. Scotty Court to call in diazepam x 5 rf.

## 2011-01-19 ENCOUNTER — Other Ambulatory Visit: Payer: Self-pay | Admitting: Neurosurgery

## 2011-01-19 DIAGNOSIS — M545 Low back pain, unspecified: Secondary | ICD-10-CM

## 2011-01-19 DIAGNOSIS — IMO0002 Reserved for concepts with insufficient information to code with codable children: Secondary | ICD-10-CM

## 2011-02-07 LAB — CBC
HCT: 33.8 — ABNORMAL LOW
HCT: 37.2
HCT: 37.9
HCT: 45.5
Hemoglobin: 12.8
Hemoglobin: 12.9
Hemoglobin: 15.4 — ABNORMAL HIGH
MCHC: 33.9
MCHC: 34.1
MCV: 105 — ABNORMAL HIGH
MCV: 105.7 — ABNORMAL HIGH
Platelets: 169
RBC: 3.21 — ABNORMAL LOW
RBC: 3.53 — ABNORMAL LOW
RBC: 3.59 — ABNORMAL LOW
RDW: 13.2
WBC: 6.1
WBC: 9.5

## 2011-02-07 LAB — DIFFERENTIAL
Basophils Absolute: 0
Basophils Relative: 0
Lymphocytes Relative: 27
Monocytes Relative: 9
Neutro Abs: 6.9
Neutrophils Relative %: 63

## 2011-02-07 LAB — PROTIME-INR
INR: 1
INR: 1.1
Prothrombin Time: 12.9
Prothrombin Time: 13.4

## 2011-02-07 LAB — CARDIAC PANEL(CRET KIN+CKTOT+MB+TROPI)
CK, MB: 2.2
CK, MB: 2.7
Relative Index: INVALID
Total CK: 51

## 2011-02-07 LAB — COMPREHENSIVE METABOLIC PANEL
Alkaline Phosphatase: 65
BUN: 13
Creatinine, Ser: 0.6
Glucose, Bld: 125 — ABNORMAL HIGH
Potassium: 4
Total Bilirubin: 0.8
Total Protein: 7.2

## 2011-02-07 LAB — APTT: aPTT: 32

## 2011-02-07 LAB — LIPID PANEL
Cholesterol: 104
LDL Cholesterol: 43
VLDL: 24

## 2011-02-08 ENCOUNTER — Ambulatory Visit (INDEPENDENT_AMBULATORY_CARE_PROVIDER_SITE_OTHER): Payer: Medicare Other | Admitting: *Deleted

## 2011-02-08 DIAGNOSIS — I4891 Unspecified atrial fibrillation: Secondary | ICD-10-CM

## 2011-02-08 LAB — PROTIME-INR
INR: 2.2 — ABNORMAL HIGH
INR: 2.8 — ABNORMAL HIGH
Prothrombin Time: 25.2 — ABNORMAL HIGH
Prothrombin Time: 25.2 — ABNORMAL HIGH
Prothrombin Time: 27.3 — ABNORMAL HIGH

## 2011-02-08 LAB — POCT I-STAT, CHEM 8
BUN: 13
Creatinine, Ser: 0.8
Potassium: 3.7
Sodium: 134 — ABNORMAL LOW

## 2011-02-08 LAB — CBC
Hemoglobin: 13.9
MCHC: 34.8
MCHC: 34.8
MCV: 104.1 — ABNORMAL HIGH
RBC: 3.49 — ABNORMAL LOW
RBC: 3.81 — ABNORMAL LOW
RDW: 13.9

## 2011-02-08 LAB — BASIC METABOLIC PANEL
BUN: 16
CO2: 26
Calcium: 8.1 — ABNORMAL LOW
Chloride: 100
Chloride: 102
Creatinine, Ser: 0.58
Creatinine, Ser: 0.9
GFR calc Af Amer: >60
GFR calc Af Amer: >60
Glucose, Bld: 156 — ABNORMAL HIGH
Sodium: 144

## 2011-02-08 LAB — HEMOGLOBIN A1C: Hgb A1c MFr Bld: 6.3 — ABNORMAL HIGH

## 2011-02-08 LAB — GAMMA GT: GGT: 130 — ABNORMAL HIGH

## 2011-02-08 LAB — DIFFERENTIAL
Basophils Relative: 2 — ABNORMAL HIGH
Lymphocytes Relative: 9 — ABNORMAL LOW
Monocytes Absolute: 0.7
Monocytes Relative: 5
Neutro Abs: 11.8 — ABNORMAL HIGH

## 2011-02-08 LAB — COMPREHENSIVE METABOLIC PANEL
ALT: 22
AST: 38 — ABNORMAL HIGH
Calcium: 8.9
GFR calc Af Amer: >60
Sodium: 137
Total Protein: 6.3

## 2011-02-08 LAB — CARDIAC PANEL(CRET KIN+CKTOT+MB+TROPI)
CK, MB: 6.3 — ABNORMAL HIGH
CK, MB: 9.7 — ABNORMAL HIGH
Relative Index: 0.9
Relative Index: 1.2
Total CK: 738 — ABNORMAL HIGH
Total CK: 740 — ABNORMAL HIGH
Total CK: 800 — ABNORMAL HIGH
Troponin I: 0.02
Troponin I: 0.02

## 2011-02-08 LAB — AST: AST: 44 — ABNORMAL HIGH

## 2011-02-08 LAB — IRON AND TIBC
Iron: 42
TIBC: 308
UIBC: 266

## 2011-02-08 LAB — ALT: ALT: 29

## 2011-02-14 ENCOUNTER — Other Ambulatory Visit: Payer: Medicare Other

## 2011-02-14 DIAGNOSIS — IMO0002 Reserved for concepts with insufficient information to code with codable children: Secondary | ICD-10-CM

## 2011-02-14 HISTORY — DX: Reserved for concepts with insufficient information to code with codable children: IMO0002

## 2011-02-17 ENCOUNTER — Other Ambulatory Visit: Payer: Self-pay | Admitting: Neurosurgery

## 2011-02-17 ENCOUNTER — Ambulatory Visit
Admission: RE | Admit: 2011-02-17 | Discharge: 2011-02-17 | Disposition: A | Discharge status: Home / Self Care | Payer: Medicare Other | Source: Ambulatory Visit | Attending: Neurosurgery | Admitting: Neurosurgery

## 2011-02-17 DIAGNOSIS — IMO0002 Reserved for concepts with insufficient information to code with codable children: Secondary | ICD-10-CM

## 2011-02-17 DIAGNOSIS — M545 Low back pain: Secondary | ICD-10-CM

## 2011-02-22 ENCOUNTER — Telehealth: Payer: Self-pay | Admitting: Internal Medicine

## 2011-02-22 MED ORDER — METOPROLOL TARTRATE 25 MG PO TABS: ORAL_TABLET | ORAL | Status: DC

## 2011-02-22 NOTE — Telephone Encounter (Signed)
Pt needs refill on metoprolol 25mg  per husband it was called last week and we still have not responded and patient only has 2days left pls call into [facility] at [facility]

## 2011-02-28 ENCOUNTER — Telehealth: Payer: Self-pay | Admitting: *Deleted

## 2011-02-28 MED ORDER — METOPROLOL TARTRATE 25 MG PO TABS: ORAL_TABLET | ORAL | Status: DC

## 2011-02-28 NOTE — Telephone Encounter (Signed)
Pt was unhappy his wife's med was not called in, there was an error and it was printed instead of e-scribed. I have called the pharmacy and spoke with Trey Paula with the refill information.

## 2011-03-08 ENCOUNTER — Ambulatory Visit (INDEPENDENT_AMBULATORY_CARE_PROVIDER_SITE_OTHER): Payer: Medicare Other | Admitting: *Deleted

## 2011-03-08 DIAGNOSIS — I4891 Unspecified atrial fibrillation: Secondary | ICD-10-CM

## 2011-03-08 DIAGNOSIS — Z7901 Long term (current) use of anticoagulants: Secondary | ICD-10-CM

## 2011-03-28 ENCOUNTER — Other Ambulatory Visit: Payer: Self-pay | Admitting: Internal Medicine

## 2011-03-29 MED ORDER — PRAVASTATIN SODIUM 80 MG PO TABS: 80.0000 mg | ORAL_TABLET | Freq: Every day | ORAL | Status: DC

## 2011-04-11 ENCOUNTER — Telehealth: Payer: Self-pay | Admitting: Internal Medicine

## 2011-04-11 ENCOUNTER — Other Ambulatory Visit: Payer: Self-pay | Admitting: Internal Medicine

## 2011-04-11 DIAGNOSIS — I10 Essential (primary) hypertension: Secondary | ICD-10-CM

## 2011-04-11 NOTE — Telephone Encounter (Signed)
New message:  Pt stated the phar had faxed and he had called 2 time today for her Ramipril, which she is out of and it has not been called in. He is upset.  Please check and call as soon as possible to [employer].

## 2011-04-11 NOTE — Telephone Encounter (Signed)
Pt is out of medication and needs RX called in ASAP.

## 2011-04-12 ENCOUNTER — Other Ambulatory Visit: Payer: Self-pay | Admitting: Family Medicine

## 2011-04-12 NOTE — Telephone Encounter (Signed)
Pt called again regarding refill

## 2011-04-12 NOTE — Telephone Encounter (Signed)
Pt need new rx percocet 10-650 mg. Pt would like alisha to return her call

## 2011-04-12 NOTE — Telephone Encounter (Signed)
Pt last seen 09/29/10. Pls advise.  

## 2011-04-12 NOTE — Telephone Encounter (Signed)
Patient is going to call [facility] today and establish with them. She will not have enough Percocet to last. Can she get a refill? [facility] [address] [facility].

## 2011-04-13 ENCOUNTER — Encounter: Payer: Medicare Other | Admitting: *Deleted

## 2011-04-13 ENCOUNTER — Ambulatory Visit: Payer: Medicare Other | Admitting: Internal Medicine

## 2011-04-13 ENCOUNTER — Telehealth: Payer: Self-pay | Admitting: *Deleted

## 2011-04-13 DIAGNOSIS — I10 Essential (primary) hypertension: Secondary | ICD-10-CM

## 2011-04-13 MED ORDER — RAMIPRIL 5 MG PO CAPS: 5.0000 mg | ORAL_CAPSULE | Freq: Every day | ORAL | Status: DC

## 2011-04-13 MED ORDER — OXYCODONE-ACETAMINOPHEN 10-650 MG PO TABS: 1.0000 | ORAL_TABLET | Freq: Four times a day (QID) | ORAL | Status: DC | PRN

## 2011-04-13 NOTE — Telephone Encounter (Signed)
Script ready for pick up and pt aware. 

## 2011-04-13 NOTE — Telephone Encounter (Signed)
Wrote for one refill only. No more after that

## 2011-04-13 NOTE — Telephone Encounter (Signed)
This Rx sent in already.  No action needed.  Judithe Modest, CMA

## 2011-04-13 NOTE — Telephone Encounter (Signed)
Pt says she has called & her pharmacy several times to have her Ramipril refilled without answer.  She states that "it took 9 days last time to get my blood pressure medication refilled" Prescription for Ramipril called in to her pharmacy. Pt has upcoming appt  With Dr. Graciela Husbands. Pt reassured and is pleased that this will be called in today. Mylo Red RN

## 2011-04-19 ENCOUNTER — Telehealth: Payer: Self-pay | Admitting: Internal Medicine

## 2011-04-19 NOTE — Telephone Encounter (Signed)
New problem Pt wants to know about her flu shot. She had been seeing Dr Scotty Court. Please call her back She is confused.

## 2011-04-19 NOTE — Telephone Encounter (Signed)
I spoke with the patient. She had questions as to when her last flu shot was. I explained that I saw 03/17/10 documented on her snap shot, but that would not have been given in our office as we have not given flu shots to patients in the last 2 years. She verbalizes understanding.

## 2011-04-28 ENCOUNTER — Telehealth: Payer: Self-pay | Admitting: *Deleted

## 2011-04-28 NOTE — Telephone Encounter (Signed)
Requesting new rx for temazepam 30 mg cap sent to [facility].  Per pharmacy script can not be filled as Dr. Scotty Court has retired.  Please send in new rx.  Pt will run out of med tomorrow.  She has a  new pt appt scheduled to see Dr. Felicity Coyer on 05/13/11.

## 2011-04-28 NOTE — Telephone Encounter (Signed)
Pls advise.  

## 2011-04-28 NOTE — Telephone Encounter (Signed)
Refill #15 tablets until follow up with Dr Felicity Coyer.

## 2011-04-29 MED ORDER — TEMAZEPAM 30 MG PO CAPS: 30.0000 mg | ORAL_CAPSULE | Freq: Every evening | ORAL | Status: DC | PRN

## 2011-04-29 NOTE — Telephone Encounter (Signed)
Rx called and pt aware that rx has been called in

## 2011-05-03 ENCOUNTER — Encounter: Payer: Self-pay | Admitting: Internal Medicine

## 2011-05-04 ENCOUNTER — Ambulatory Visit: Payer: Medicare Other | Admitting: Internal Medicine

## 2011-05-04 ENCOUNTER — Encounter: Payer: Medicare Other | Admitting: *Deleted

## 2011-05-06 ENCOUNTER — Encounter: Payer: Medicare Other | Admitting: *Deleted

## 2011-05-06 ENCOUNTER — Ambulatory Visit: Payer: Medicare Other | Admitting: Internal Medicine

## 2011-05-13 ENCOUNTER — Ambulatory Visit (INDEPENDENT_AMBULATORY_CARE_PROVIDER_SITE_OTHER): Payer: Medicare Other | Admitting: Internal Medicine

## 2011-05-13 ENCOUNTER — Encounter: Payer: Self-pay | Admitting: Internal Medicine

## 2011-05-13 VITALS — BP 130/62 | HR 59 | Temp 97.8°F | Wt 125.0 lb

## 2011-05-13 DIAGNOSIS — M545 Low back pain: Secondary | ICD-10-CM

## 2011-05-13 DIAGNOSIS — I4891 Unspecified atrial fibrillation: Secondary | ICD-10-CM

## 2011-05-13 DIAGNOSIS — J40 Bronchitis, not specified as acute or chronic: Secondary | ICD-10-CM

## 2011-05-13 MED ORDER — TEMAZEPAM 30 MG PO CAPS: 30.0000 mg | ORAL_CAPSULE | Freq: Every evening | ORAL | Status: DC | PRN

## 2011-05-13 MED ORDER — AZITHROMYCIN 250 MG PO TABS: ORAL_TABLET | ORAL | Status: AC

## 2011-05-13 MED ORDER — FENTANYL 50 MCG/HR TD PT72: 1.0000 | MEDICATED_PATCH | TRANSDERMAL | Status: DC

## 2011-05-13 MED ORDER — OXYCODONE-ACETAMINOPHEN 10-650 MG PO TABS: 1.0000 | ORAL_TABLET | Freq: Four times a day (QID) | ORAL | Status: DC | PRN

## 2011-05-13 NOTE — Assessment & Plan Note (Signed)
Chronic problems - narcotics maintained for same So long as no escalating dose or change in pain, i will continue to fill medications as ongoing

## 2011-05-13 NOTE — Assessment & Plan Note (Signed)
On metformin and amaryl for same Reports home cbgs 70-100 fasting Recheck a1c now Lab Results  Component Value Date   HGBA1C 6.0 11/18/2010

## 2011-05-13 NOTE — Progress Notes (Signed)
Subjective:    Patient ID: [name], female    DOB: 16-Nov-1932, 77 y.o.   MRN: [id number]  HPI New to me, transferred from [facility] locaation due to retirement of prior PCP ([place]) Requests med refills **Patient also explains tumultuous relationship between herself and spouse Joe. She specifically requests personal and medical information not to be shared with her spouse.**  Also reviewed chronic medical issues today:  COPD. Ongoing tobacco abuse. Denies daily dyspnea symptoms but increase in cough and sputum over past 2 weeks. Chronic mild shortness of breath with exertion unchanged. No fever, no pleurisy. No lower extremity swelling.  Type 2 diabetes - home CBGs reviewed range 70-100 fasting. Reports compliance with medications as prescribed denies symptoms of hypoglycemia  Paroxysmal A. fib. On chronic anticoagulation for same. Follows with cardiology. Denies lower extremity swelling, palpitations or chest tightness.  Chronic pain. Located in low back, exacerbation summer or fall 2012 related to compression fracture. Kyphoplasty performed for same without significant change in symptoms. Has followed with neurosurgeon at Dr. Dutch Quint for same issues. On chronic narcotics for years but denies change in dose. Remotely followed at pain clinic but reports preference for primary care to be provider of medications for cost concerns  Past Medical History  Diagnosis Date  . HTN (hypertension)   . Hyperlipidemia   . Diabetes mellitus, type 2   . Paroxysmal atrial fibrillation     chronic anticoag - follows [facility]  . Right bundle branch block and incomplete left bundle branch block     with left axis deviation  . Chronic back pain     narcotic dependence  . Lumbar disc disease     dr. Dutch Quint  . Mitral valve prolapse   . Macular degeneration   . Esophageal stricture   . Gastritis     RUT negative, 2005  . Diverticulosis   . Alcohol abuse   . Compression fracture 02/2011    s/p KP     Family History  Problem Relation Age of Onset  . Heart disease Mother   . Cancer Father     throat  . Atrial fibrillation Sister    History  Substance Use Topics  . Smoking status: Current Everyday Smoker -- 0.5 packs/day for .5 years    Types: Cigarettes  . Smokeless tobacco: Never Used  . Alcohol Use: Yes     daily   Review of Systems Constitutional: Negative for fever or expected weight change.  Respiratory: Ongoing productive cough x2 weeks and shortness of breath , no wheeze.   Cardiovascular: Negative for chest pain or palpitations.  Gastrointestinal: Negative for abdominal pain, no bowel changes.  Musculoskeletal: Negative for gait problem or joint swelling.  chronic back and joint pain without change Skin: Negative for rash.  Neurological: Negative for dizziness or headache.  No other specific complaints in a complete review of systems (except as listed in HPI above).     Objective:   Physical Exam  BP 130/62  Pulse 59  Temp(Src) 97.8 F (36.6 C) (Oral)  Wt 125 lb (56.7 kg)  SpO2 91% Wt Readings from Last 3 Encounters:  05/13/11 125 lb (56.7 kg)  09/29/10 138 lb (62.596 kg)  06/09/10 149 lb (67.586 kg)   Constitutional: She is frail and chronically ill appearing  -but no acute distress.  Mouth/Throat: Oropharynx is clear and moist. No oropharyngeal exudate.  Eyes: Conjunctivae and EOM are normal. Pupils are equal, round, and reactive to light. No scleral icterus.  Neck: Normal  range of motion. Neck supple. No JVD or LAD present. No thyromegaly present.  Cardiovascular: Normal rate, irregular rhythm and normal heart sounds.  No murmur heard. No BLE edema. Pulmonary/Chest: Effort normal but few scattered rhonchi. No respiratory distress at rest. She has no wheezes.  Abdominal: Soft. Bowel sounds are normal. She exhibits no distension. There is no tenderness. no masses Musculoskeletal: Kyphotic posture  - no acute synovitis or gross deformities  Neurological:  She is alert and oriented to person, place, and time. No cranial nerve deficit. Coordination normal.  speech, cognition and recall intact. Follows commands without difficulty Skin: Skin is warm and dry. No rash noted. No erythema.  Psychiatric: She has a normal mood and affect. Her behavior is normal. Judgment and thought content normal.   Lab Results  Component Value Date   WBC 9.3 09/01/2010   HGB 14.4 09/01/2010   HCT 41.9 09/01/2010   PLT 247 09/01/2010   GLUCOSE 58* 09/29/2010   CHOL 114 09/29/2010   TRIG 145.0 09/29/2010   HDL 35.60* 09/29/2010   LDLCALC 49 09/29/2010   ALT 18 09/29/2010   AST 32 09/29/2010   NA 139 09/29/2010   K 5.4* 09/29/2010   CL 100 09/29/2010   CREATININE 0.6 09/29/2010   BUN 9 09/29/2010   CO2 28 09/29/2010   TSH 0.77 09/29/2010   INR 2.3 03/08/2011   HGBA1C 6.0 11/18/2010   MICROALBUR 0.8 10/01/2009       Assessment & Plan:  Bronchitis - suspect chronic symptoms but pt reports increase cough in past 2 weeks- Zpak done, OTC cough meds recommended and advised to consider smoking cessation  Also See problem list. Medications and labs reviewed today.  Time spent with pt today 42 minutes, greater than 50% time spent counseling patient on diabetes, COPD, chronic pain and medication review. Also review of prior records

## 2011-05-13 NOTE — Patient Instructions (Signed)
It was good to see you today. We have reviewed your prior records including labs and tests today Medications reviewed, no new changes at this time. Refill on medication(s) as discussed today. As discussed, I will continue to fill her pain prescriptions so long as there is no change in her pain, and need for increased supply of pills or patches, or other problems with these prescriptions. If we have problems or there is a change in your pain medication needs, you will be referred to a pain management clinic specialist Zpak for bronchitis symptoms -  Your prescription(s) have been submitted to your pharmacy. Please take as directed and contact our office if you believe you are having problem(s) with the medication(s). Test(s) ordered today. Your results will be called to you after review (48-72hours after test completion). If any changes need to be made, you will be notified at that time. Please schedule followup in 4 months for diabetes mellitus check, call sooner if problems.

## 2011-05-13 NOTE — Assessment & Plan Note (Signed)
Chronic anticoag - follows with cards and [facility] for same The current medical regimen is effective;  continue present plan and medications.

## 2011-05-27 ENCOUNTER — Ambulatory Visit (INDEPENDENT_AMBULATORY_CARE_PROVIDER_SITE_OTHER): Payer: Medicare Other | Admitting: Internal Medicine

## 2011-05-27 ENCOUNTER — Ambulatory Visit (INDEPENDENT_AMBULATORY_CARE_PROVIDER_SITE_OTHER): Payer: Medicare Other | Admitting: *Deleted

## 2011-05-27 ENCOUNTER — Encounter: Payer: Self-pay | Admitting: Internal Medicine

## 2011-05-27 VITALS — BP 101/56 | HR 58 | Ht 63.0 in | Wt 123.8 lb

## 2011-05-27 DIAGNOSIS — I4891 Unspecified atrial fibrillation: Secondary | ICD-10-CM

## 2011-05-27 DIAGNOSIS — Z7901 Long term (current) use of anticoagulants: Secondary | ICD-10-CM

## 2011-05-27 DIAGNOSIS — R51 Headache: Secondary | ICD-10-CM

## 2011-05-27 LAB — POCT INR: INR: 2.8

## 2011-05-27 NOTE — Patient Instructions (Signed)
Your physician wants you to follow-up in: 1 year with Dr. Klein. You will receive a reminder letter in the mail two months in advance. If you don't receive a letter, please call our office to schedule the follow-up appointment.  Your physician recommends that you continue on your current medications as directed. Please refer to the Current Medication list given to you today.  

## 2011-05-27 NOTE — Assessment & Plan Note (Signed)
Holding sinus rhythm as best as we know She continues on anticoagulation with warfarin

## 2011-05-27 NOTE — Assessment & Plan Note (Signed)
I have suggested she followup with Dr. Bayard Hugger. I sent her a staff message to consider further evaluation.

## 2011-05-27 NOTE — Progress Notes (Signed)
HPI  [name] is a 78 y.o. female is seen in followup for paroxysmal atrial fibrillation with a rapid ventricular response. This occurs in the context of hypertension and diabetes.  Her other complaint is that she has headaches that awaken her at night and are present in the morning. Since going on for about 3 months. She also has had problems with lower extremity pain which has been attributed to her back.  Past Medical History  Diagnosis Date  . HTN (hypertension)   . Hyperlipidemia   . Diabetes mellitus, type 2   . Paroxysmal atrial fibrillation     chronic anticoag - follows [facility]  . Right bundle branch block and incomplete left bundle branch block     with left axis deviation  . Chronic back pain     narcotic dependence  . Lumbar disc disease     dr. Dutch Quint  . Mitral valve prolapse   . Macular degeneration   . Gastritis     RUT negative, 2005  . Diverticulosis   . Alcohol abuse   . Compression fracture 02/2011    s/p KP    Past Surgical History  Procedure Date  . Right ankle surgery   . Knee arthroscopy     Current Outpatient Prescriptions  Medication Sig Dispense Refill  . Cholecalciferol (VITAMIN D) 1000 UNITS capsule Take 1,000 Units by mouth daily.        . diazepam (VALIUM) 5 MG tablet Take 1 tablet (5 mg total) by mouth 4 (four) times daily as needed for anxiety.  120 tablet  5  . fentaNYL (DURAGESIC - DOSED MCG/HR) 50 MCG/HR Place 1 patch (50 mcg total) onto the skin every 3 (three) days.  10 patch  0  . glimepiride (AMARYL) 4 MG tablet TAKE 1/2 TAB BY MOUTH TWO TIMES A DAY  30 tablet  11  . glucose blood (ONE TOUCH TEST STRIPS) test strip 1 each by Other route as needed. Use as instructed       . metFORMIN (GLUCOPHAGE) 500 MG tablet TAKE 1 TABLET TWICE DAILY  60 tablet  11  . metoprolol tartrate (LOPRESSOR) 25 MG tablet Take 1/2 tab by mouth twice daily   30 tablet  2  . oxyCODONE-acetaminophen (PERCOCET) 10-650 MG per tablet Take 1 tablet by mouth every  6 (six) hours as needed.  120 tablet  0  . PARoxetine (PAXIL) 40 MG tablet TAKE 1 TABLET EVERY DAY  90 tablet  3  . pravastatin (PRAVACHOL) 80 MG tablet Take 1 tablet (80 mg total) by mouth daily.  30 tablet  10  . Probiotic Product (ALIGN) 4 MG CAPS Take by mouth daily.        . psyllium (METAMUCIL MULTIHEALTH FIBER) 58.6 % powder Take 1 packet by mouth daily.        . ramipril (ALTACE) 5 MG capsule Take 1 capsule (5 mg total) by mouth daily.  30 capsule  5  . temazepam (RESTORIL) 30 MG capsule Take 1 capsule (30 mg total) by mouth at bedtime as needed.  30 capsule  5  . verapamil (CALAN-SR) 180 MG CR tablet TAKE 1 TABLET BY MOUTH TWICE A DAY  60 tablet  11  . warfarin (COUMADIN) 5 MG tablet Use as directed per anti-cougulation clinic.  40 tablet  3    Allergies  Allergen Reactions  . Codeine   . Morphine And Related Other (See Comments)    Pt had severe respiratory depression and excessive sedation  during a recent hospitalization and was told it was 2/2 morphine.    Review of Systems negative except from HPI and PMH  Physical Exam BP 101/56  Pulse 58  Ht 5\' 3"  (1.6 m)  Wt 123 lb 12.8 oz (56.155 kg)  BMI 21.93 kg/m2 Well developed and well nourished in no acute distress HENT normal E scleral and icterus clear Neck Supple JVP flat; carotids brisk and full Clear to ausculation Regular rate and rhythm, no murmurs gallops or rub Soft with active bowel sounds No clubbing cyanosis none Edema Alert and oriented, she is sitting in a wheelchair Skin Warm and Dry  Sinus rhythm with left axis deviation. Intervals 0.16/0.12/0.45 There is poor R wave progression consistent with an incomplete left bundle branch block Assessment and  Plan

## 2011-05-30 ENCOUNTER — Other Ambulatory Visit: Payer: Self-pay | Admitting: Internal Medicine

## 2011-05-30 MED ORDER — METOPROLOL TARTRATE 25 MG PO TABS: ORAL_TABLET | ORAL | Status: DC

## 2011-05-30 NOTE — Telephone Encounter (Signed)
Script sent to [facility].

## 2011-05-30 NOTE — Telephone Encounter (Signed)
New msg Pt was here Friday and forgot to get refill of metoprolol she is totally out of pills She wants it called to [facility] [address]

## 2011-06-13 ENCOUNTER — Other Ambulatory Visit: Payer: Self-pay | Admitting: Internal Medicine

## 2011-06-13 ENCOUNTER — Other Ambulatory Visit: Payer: Self-pay

## 2011-06-13 MED ORDER — WARFARIN SODIUM 5 MG PO TABS: 5.0000 mg | ORAL_TABLET | ORAL | Status: DC

## 2011-06-16 ENCOUNTER — Ambulatory Visit: Payer: Medicare Other | Admitting: Internal Medicine

## 2011-06-17 ENCOUNTER — Ambulatory Visit: Payer: Medicare Other | Admitting: Internal Medicine

## 2011-06-20 ENCOUNTER — Ambulatory Visit: Payer: Medicare Other | Admitting: Internal Medicine

## 2011-06-24 ENCOUNTER — Encounter: Payer: Medicare Other | Admitting: *Deleted

## 2011-06-29 ENCOUNTER — Telehealth: Payer: Self-pay | Admitting: Internal Medicine

## 2011-06-29 DIAGNOSIS — M545 Low back pain: Secondary | ICD-10-CM

## 2011-06-29 NOTE — Telephone Encounter (Signed)
PT STATES HER RX FOR HYDROCODONE DIDN'T HAVE ENOUGH PILLS.  SHE WANTS THE SAME QUANTITY THAT DR ZOXWRUEA GAVE HER.  SHE IS IN PAIN.  SHE THINKS SHE CAN TAKE 4 PER DAY.  SHE CAN'T SEE TO READ THE DIRECTIONS ON THE BOTTLE.

## 2011-06-29 NOTE — Telephone Encounter (Signed)
Patient was given prescription for oxycodone, not hydrocodone. She was given #120/mo > this is enough to take 4 daily x 30d I will not give additional pills as this is the same amount as was provided by Dr. Scotty Court. If patient feels she needs more pain medication supply, she will need to go to pain clinic. I will be happy to refer as needed

## 2011-06-30 MED ORDER — FENTANYL 50 MCG/HR TD PT72: 1.0000 | MEDICATED_PATCH | TRANSDERMAL | Status: DC

## 2011-06-30 MED ORDER — OXYCODONE-ACETAMINOPHEN 10-650 MG PO TABS: 1.0000 | ORAL_TABLET | Freq: Four times a day (QID) | ORAL | Status: DC | PRN

## 2011-06-30 NOTE — Telephone Encounter (Signed)
Okay for refill?  

## 2011-06-30 NOTE — Telephone Encounter (Signed)
Notified pt rx ready for pick-up... 06/30/11@2 :05pm/LMB

## 2011-06-30 NOTE — Telephone Encounter (Signed)
Addended by: Rene Paci A on: 06/30/2011 01:54 PM   Modules accepted: Orders

## 2011-06-30 NOTE — Telephone Encounter (Signed)
Notified pt with md response she decline on referral for pain clinic. She states she wasn't asking for additional pills its time for refill. She also states she need her fentanyl patch. Check epic last refill on both was 05/13/11. Pt is due... 06/30/11@10 :47am/LMB

## 2011-07-01 ENCOUNTER — Telehealth: Payer: Self-pay

## 2011-07-01 DIAGNOSIS — Z1239 Encounter for other screening for malignant neoplasm of breast: Secondary | ICD-10-CM

## 2011-07-01 NOTE — Telephone Encounter (Signed)
My understanding is There is normally no need to order Screening Mammograms as long as it has been 12 months since last exam  Order is only needed for diagnostic mammograms

## 2011-07-01 NOTE — Telephone Encounter (Signed)
Pt called requesting a referral to [facility] for a mammogram

## 2011-07-04 NOTE — Telephone Encounter (Signed)
done

## 2011-07-11 ENCOUNTER — Other Ambulatory Visit: Payer: Self-pay | Admitting: *Deleted

## 2011-07-11 MED ORDER — TEMAZEPAM 30 MG PO CAPS: 30.0000 mg | ORAL_CAPSULE | Freq: Every evening | ORAL | Status: DC | PRN

## 2011-07-11 NOTE — Telephone Encounter (Signed)
[facility]ed [facility] [address] @ [phone]... 07/11/11@3 :49pm/LMB

## 2011-07-13 ENCOUNTER — Other Ambulatory Visit: Payer: Self-pay | Admitting: Family Medicine

## 2011-07-28 ENCOUNTER — Ambulatory Visit (INDEPENDENT_AMBULATORY_CARE_PROVIDER_SITE_OTHER): Payer: Medicare Other | Admitting: Pharmacist

## 2011-07-28 ENCOUNTER — Other Ambulatory Visit: Payer: Self-pay | Admitting: *Deleted

## 2011-07-28 DIAGNOSIS — M545 Low back pain: Secondary | ICD-10-CM

## 2011-07-28 DIAGNOSIS — I4891 Unspecified atrial fibrillation: Secondary | ICD-10-CM

## 2011-07-28 DIAGNOSIS — Z7901 Long term (current) use of anticoagulants: Secondary | ICD-10-CM

## 2011-07-28 LAB — POCT INR: INR: 1.5

## 2011-07-28 NOTE — Telephone Encounter (Signed)
Left vm needing refills on oxycodone & fentanyl patch.... 07/28/11@12 :06pm/LMB

## 2011-07-29 ENCOUNTER — Encounter: Payer: Self-pay | Admitting: Internal Medicine

## 2011-07-29 MED ORDER — OXYCODONE-ACETAMINOPHEN 10-650 MG PO TABS: 1.0000 | ORAL_TABLET | Freq: Four times a day (QID) | ORAL | Status: DC | PRN

## 2011-07-29 MED ORDER — FENTANYL 50 MCG/HR TD PT72: 1.0000 | MEDICATED_PATCH | TRANSDERMAL | Status: DC

## 2011-07-29 NOTE — Telephone Encounter (Signed)
Notified pt rx's ready for pick-up.... 07/29/11@8 :46am/LMB

## 2011-08-01 ENCOUNTER — Other Ambulatory Visit: Payer: Self-pay | Admitting: *Deleted

## 2011-08-01 MED ORDER — DIAZEPAM 5 MG PO TABS: 5.0000 mg | ORAL_TABLET | Freq: Four times a day (QID) | ORAL | Status: DC | PRN

## 2011-08-01 NOTE — Telephone Encounter (Signed)
Addended by: Deatra James on: 08/01/2011 02:23 PM   Modules accepted: Orders

## 2011-08-01 NOTE — Telephone Encounter (Signed)
Reprinted script & [facility]... 08/01/11@2 :23pm/LMB

## 2011-08-10 ENCOUNTER — Ambulatory Visit (INDEPENDENT_AMBULATORY_CARE_PROVIDER_SITE_OTHER): Payer: Medicare Other | Admitting: *Deleted

## 2011-08-10 DIAGNOSIS — Z7901 Long term (current) use of anticoagulants: Secondary | ICD-10-CM

## 2011-08-10 DIAGNOSIS — I4891 Unspecified atrial fibrillation: Secondary | ICD-10-CM

## 2011-08-11 ENCOUNTER — Encounter: Payer: Self-pay | Admitting: Internal Medicine

## 2011-09-01 ENCOUNTER — Ambulatory Visit (INDEPENDENT_AMBULATORY_CARE_PROVIDER_SITE_OTHER): Payer: Medicare Other | Admitting: *Deleted

## 2011-09-01 DIAGNOSIS — I4891 Unspecified atrial fibrillation: Secondary | ICD-10-CM

## 2011-09-01 DIAGNOSIS — Z7901 Long term (current) use of anticoagulants: Secondary | ICD-10-CM

## 2011-09-01 LAB — POCT INR: INR: 2.9

## 2011-09-05 ENCOUNTER — Other Ambulatory Visit: Payer: Self-pay | Admitting: *Deleted

## 2011-09-05 DIAGNOSIS — M545 Low back pain: Secondary | ICD-10-CM

## 2011-09-05 MED ORDER — OXYCODONE-ACETAMINOPHEN 10-650 MG PO TABS: 1.0000 | ORAL_TABLET | Freq: Four times a day (QID) | ORAL | Status: DC | PRN

## 2011-09-05 MED ORDER — FENTANYL 50 MCG/HR TD PT72: 1.0000 | MEDICATED_PATCH | TRANSDERMAL | Status: DC

## 2011-09-05 MED ORDER — DIAZEPAM 5 MG PO TABS: 5.0000 mg | ORAL_TABLET | Freq: Four times a day (QID) | ORAL | Status: DC | PRN

## 2011-09-05 NOTE — Telephone Encounter (Signed)
Notified pt pain med & fentanyl patch ready for pick-up. Pt also stated she is needing refill on her diazepam. Is this ok?... 09/05/11@1 :29pm/LMB

## 2011-09-05 NOTE — Telephone Encounter (Signed)
Left msg on vm needing to pick up pain meds rx's... 09/05/11@12 :38pm/LMB

## 2011-09-05 NOTE — Telephone Encounter (Signed)
Notified pt rx's ready for pick-up... 09/05/11@1 :30pm/LMB

## 2011-09-06 NOTE — Telephone Encounter (Signed)
[facility]... 09/06/11@9 :18am/LMB

## 2011-09-26 ENCOUNTER — Other Ambulatory Visit: Payer: Self-pay | Admitting: Internal Medicine

## 2011-09-29 ENCOUNTER — Ambulatory Visit (INDEPENDENT_AMBULATORY_CARE_PROVIDER_SITE_OTHER): Payer: Medicare Other | Admitting: *Deleted

## 2011-09-29 DIAGNOSIS — Z7901 Long term (current) use of anticoagulants: Secondary | ICD-10-CM

## 2011-09-29 DIAGNOSIS — I4891 Unspecified atrial fibrillation: Secondary | ICD-10-CM

## 2011-09-29 LAB — POCT INR: INR: 3.3

## 2011-10-07 ENCOUNTER — Other Ambulatory Visit: Payer: Self-pay | Admitting: Family Medicine

## 2011-10-08 ENCOUNTER — Other Ambulatory Visit: Payer: Self-pay | Admitting: Family Medicine

## 2011-10-11 ENCOUNTER — Other Ambulatory Visit: Payer: Self-pay

## 2011-10-11 DIAGNOSIS — M545 Low back pain: Secondary | ICD-10-CM

## 2011-10-11 MED ORDER — OXYCODONE-ACETAMINOPHEN 10-650 MG PO TABS: 1.0000 | ORAL_TABLET | Freq: Four times a day (QID) | ORAL | Status: DC | PRN

## 2011-10-11 NOTE — Telephone Encounter (Signed)
Pt informed, Rx in cabinet for pt pick up  

## 2011-10-27 ENCOUNTER — Telehealth: Payer: Self-pay

## 2011-10-27 DIAGNOSIS — N63 Unspecified lump in unspecified breast: Secondary | ICD-10-CM

## 2011-10-31 ENCOUNTER — Ambulatory Visit (INDEPENDENT_AMBULATORY_CARE_PROVIDER_SITE_OTHER): Payer: Medicare Other | Admitting: Pharmacist

## 2011-10-31 DIAGNOSIS — Z7901 Long term (current) use of anticoagulants: Secondary | ICD-10-CM

## 2011-10-31 DIAGNOSIS — I4891 Unspecified atrial fibrillation: Secondary | ICD-10-CM

## 2011-11-02 ENCOUNTER — Ambulatory Visit: Payer: Medicare Other | Admitting: Internal Medicine

## 2011-11-14 ENCOUNTER — Other Ambulatory Visit: Payer: Self-pay | Admitting: Internal Medicine

## 2011-11-16 ENCOUNTER — Ambulatory Visit (INDEPENDENT_AMBULATORY_CARE_PROVIDER_SITE_OTHER): Payer: Medicare Other | Admitting: Internal Medicine

## 2011-11-16 ENCOUNTER — Encounter: Payer: Self-pay | Admitting: Internal Medicine

## 2011-11-16 ENCOUNTER — Other Ambulatory Visit (INDEPENDENT_AMBULATORY_CARE_PROVIDER_SITE_OTHER): Payer: Medicare Other

## 2011-11-16 VITALS — BP 106/58 | HR 64 | Temp 97.0°F | Wt 112.0 lb

## 2011-11-16 DIAGNOSIS — R300 Dysuria: Secondary | ICD-10-CM

## 2011-11-16 DIAGNOSIS — E785 Hyperlipidemia, unspecified: Secondary | ICD-10-CM

## 2011-11-16 DIAGNOSIS — M545 Low back pain: Secondary | ICD-10-CM

## 2011-11-16 DIAGNOSIS — R413 Other amnesia: Secondary | ICD-10-CM

## 2011-11-16 DIAGNOSIS — R5383 Other fatigue: Secondary | ICD-10-CM

## 2011-11-16 DIAGNOSIS — IMO0001 Reserved for inherently not codable concepts without codable children: Secondary | ICD-10-CM

## 2011-11-16 DIAGNOSIS — R5381 Other malaise: Secondary | ICD-10-CM

## 2011-11-16 LAB — LIPID PANEL
Cholesterol: 127 mg/dL (ref 0–200)
HDL: 48.5 mg/dL (ref >39.00)
Triglycerides: 143 mg/dL (ref 0.0–149.0)
VLDL: 28.6 mg/dL (ref 0.0–40.0)

## 2011-11-16 LAB — URINALYSIS, ROUTINE W REFLEX MICROSCOPIC
Nitrite: NEGATIVE
Specific Gravity, Urine: 1.025 (ref 1.000–1.030)
Total Protein, Urine: NEGATIVE
pH: 5.5 (ref 5.0–8.0)

## 2011-11-16 LAB — HEPATIC FUNCTION PANEL
Alkaline Phosphatase: 53 U/L (ref 39–117)
Bilirubin, Direct: 0.1 mg/dL (ref 0.0–0.3)

## 2011-11-16 LAB — CBC WITH DIFFERENTIAL/PLATELET
Basophils Absolute: 0 10*3/uL (ref 0.0–0.1)
Basophils Relative: 0.4 % (ref 0.0–3.0)
Eosinophils Absolute: 0.1 10*3/uL (ref 0.0–0.7)
MCHC: 33.6 g/dL (ref 30.0–36.0)
MCV: 102.9 fl — ABNORMAL HIGH (ref 78.0–100.0)
Monocytes Absolute: 0.6 10*3/uL (ref 0.1–1.0)
Neutrophils Relative %: 65.3 % (ref 43.0–77.0)
Platelets: 194 10*3/uL (ref 150.0–400.0)
RDW: 12.8 % (ref 11.5–14.6)

## 2011-11-16 LAB — HEMOGLOBIN A1C: Hgb A1c MFr Bld: 5.5 % (ref 4.6–6.5)

## 2011-11-16 MED ORDER — FENTANYL 50 MCG/HR TD PT72: 1.0000 | MEDICATED_PATCH | TRANSDERMAL | Status: DC

## 2011-11-16 MED ORDER — OXYCODONE-ACETAMINOPHEN 10-650 MG PO TABS: 1.0000 | ORAL_TABLET | Freq: Four times a day (QID) | ORAL | Status: DC | PRN

## 2011-11-16 NOTE — Assessment & Plan Note (Signed)
On metformin and amaryl for same Reports home cbgs 70-100 fasting Recheck a1c now Lab Results  Component Value Date   HGBA1C 6.0 11/18/2010    

## 2011-11-16 NOTE — Patient Instructions (Signed)
It was good to see you today. Medications reviewed, no new changes at this time. Refill on medication(s) as discussed today. As discussed, I will continue to fill your pain prescriptions so long as there is no change in your pain, or need for increased supply of pills or patches, or other problems with these prescriptions. If we have problems or there is a change in your pain medication needs, you will be referred to a pain management clinic specialist Test(s) ordered today. Blood and xray -Your results will be called to you after review (48-72hours after test completion). If any changes need to be made, you will be notified at that time. Please schedule followup in 6 months for diabetes mellitus check, call sooner if problems.

## 2011-11-16 NOTE — Progress Notes (Signed)
Subjective:    Patient ID: [name], female    DOB: 02-12-1933, 78 y.o.   MRN: [id number]  HPI  Here for follow up - reviewed chronic medical issues today:  COPD. Ongoing tobacco abuse. Denies daily dyspnea symptoms but chronic cough and sputum. Chronic mild shortness of breath with exertion unchanged. No fever, no pleurisy. No lower extremity swelling.  Type 2 diabetes - home CBGs reviewed range 70-100 fasting. Reports compliance with medications as prescribed denies symptoms of hypoglycemia  Paroxysmal A. fib. On chronic anticoagulation for same. Follows with cardiology. Denies lower extremity swelling, palpitations or chest tightness.  Chronic pain. Located in low back, exacerbation summer or fall 2012 related to compression fracture. Kyphoplasty performed for same without significant change in symptoms. Has followed with neurosurgeon at Dr. Dutch Quint for same issues. On chronic narcotics for years but denies change in dose. Remotely followed at pain clinic but reports preference for primary care to be provider of medications for cost concerns  Past Medical History  Diagnosis Date  . HTN (hypertension)   . Hyperlipidemia   . Diabetes mellitus, type 2   . Paroxysmal atrial fibrillation     chronic anticoag - follows [facility]  . Right bundle branch block and incomplete left bundle branch block     with left axis deviation  . Chronic back pain     narcotic dependence  . Lumbar disc disease     dr. Dutch Quint  . Mitral valve prolapse   . Macular degeneration   . Gastritis     RUT negative, 2005  . Diverticulosis   . Alcohol abuse   . Compression fracture 02/2011    s/p KP   Review of Systems  Constitutional: Negative for fever or expected weight change.  Respiratory: no change in shortness of breath, no wheeze.   GU: complains of dysuria     Objective:   Physical Exam  BP 106/58  Pulse 64  Temp 97 F (36.1 C) (Oral)  Wt 112 lb 0.6 oz (50.821 kg)  SpO2 92% Wt Readings  from Last 3 Encounters:  11/16/11 112 lb 0.6 oz (50.821 kg)  05/27/11 123 lb 12.8 oz (56.155 kg)  05/13/11 125 lb (56.7 kg)   Constitutional: She is frail and chronically ill appearing, tremulous  -but no acute distress.  Neck: Normal range of motion. Neck supple. No JVD or LAD present. No thyromegaly present.  Cardiovascular: Normal rate, irregular rhythm and normal heart sounds.  No murmur heard. No BLE edema. Pulmonary/Chest: Effort normal but few scattered rhonchi. No respiratory distress at rest. She has no wheezes.  Musculoskeletal: Kyphotic posture  -  Neurological: She is alert and oriented to person and place but tangential. No cranial nerve deficit. Coordination normal.  speech, cognition intact. Follows commands without difficulty Skin: Skin is warm and dry. No rash noted. No erythema.  Psychiatric: She has a normal mood and affect. Her behavior is normal. Judgment and thought content normal.   Lab Results  Component Value Date   WBC 9.3 09/01/2010   HGB 14.4 09/01/2010   HCT 41.9 09/01/2010   PLT 247 09/01/2010   GLUCOSE 58* 09/29/2010   CHOL 114 09/29/2010   TRIG 145.0 09/29/2010   HDL 35.60* 09/29/2010   LDLCALC 49 09/29/2010   ALT 18 09/29/2010   AST 32 09/29/2010   NA 139 09/29/2010   K 5.4* 09/29/2010   CL 100 09/29/2010   CREATININE 0.6 09/29/2010   BUN 9 09/29/2010   CO2 28 09/29/2010  TSH 0.77 09/29/2010   INR 2.2 10/31/2011   HGBA1C 6.0 11/18/2010   MICROALBUR 0.8 10/01/2009       Assessment & Plan:   See problem list. Medications and labs reviewed today.  Memory loss - suspect multifactorial to centrally acting meds - check lab screening as well  Dysuria - check UA

## 2011-11-16 NOTE — Assessment & Plan Note (Signed)
On prava - Check lipids annually 

## 2011-11-16 NOTE — Assessment & Plan Note (Addendum)
Chronic problems - narcotics maintained for same So long as no escalating dose or change in pain, i will continue to fill medications as ongoing  Today, pt requests increase in pain med dose due to pain in T-spine (apperas chronic since VP but pt reports R side pain new) Check T spine given hx vert compression fx and VP 08/2010 Also i offered refer to pain clinic referral and pt declines " i have tried 2 before and they don't work" Will continue to fill rx as ongoing but i declined increase in dose/supply

## 2011-11-18 ENCOUNTER — Other Ambulatory Visit: Payer: Self-pay | Admitting: General Practice

## 2011-11-18 NOTE — Telephone Encounter (Signed)
Informed patients of negative test results.  Patient says that she is still having frequent urination.

## 2011-11-21 MED ORDER — CIPROFLOXACIN HCL 250 MG PO TABS: 250.0000 mg | ORAL_TABLET | Freq: Two times a day (BID) | ORAL | Status: DC

## 2011-11-21 NOTE — Telephone Encounter (Signed)
Notified pt with md response... 11/21/11@12 :03pm/LMB

## 2011-11-21 NOTE — Telephone Encounter (Signed)
cipro bid x 3 days - erx done

## 2011-11-23 ENCOUNTER — Telehealth: Payer: Self-pay | Admitting: Internal Medicine

## 2011-11-23 NOTE — Telephone Encounter (Signed)
Refer to [facility] done

## 2011-11-23 NOTE — Telephone Encounter (Signed)
Thanks for message - please disregard order (i will cancel) please call pt and let her know same - ok to advise OV if needed

## 2011-11-23 NOTE — Telephone Encounter (Signed)
Dr Felicity Coyer this pt called about a referral for a diag mammogram. She had this performed on 08/10/11 at [facility] report in [place]. Called [facility] and they do not think patient need another mammogram but said it was up to you. Please advise.  Thanks

## 2011-11-23 NOTE — Telephone Encounter (Signed)
Notified pt with md response.. 11/23/11@1 :46pm/LMB

## 2011-11-23 NOTE — Telephone Encounter (Signed)
Caller: Alexis/Patient; PCP: Rene Paci; CB#: [phone];  Call regarding lump in breast, onset spring 2013.   (Has had extensive mammograms done.)  Was seen by Dr. Felicity Coyer on 07/03 for office visit, did not mention lump in breast, on 07/10, it appears to be moving, "feels like the size/shape of pinky finger,"   is not painful.   Triage offered and declined.  Does not want to come in for Dr. Felicity Coyer to evaluate, but is requesting a  referral to   Jeralyn Ruths MD  [facility] [place]),  who's seen her in the past.   PLEASE CALL [facility] REGARDING REFERRAL TO Jeralyn Ruths MD.  Lynford Humphrey.

## 2011-11-25 ENCOUNTER — Other Ambulatory Visit: Payer: Self-pay | Admitting: *Deleted

## 2011-11-25 MED ORDER — METOPROLOL TARTRATE 25 MG PO TABS: ORAL_TABLET | ORAL | Status: DC

## 2011-11-28 ENCOUNTER — Other Ambulatory Visit: Payer: Self-pay | Admitting: *Deleted

## 2011-11-28 NOTE — Telephone Encounter (Signed)
Pharmacy called wanting to know if it is ok for patient to take her Lopressor and Verapamil together. Per Herbert Seta RN, pt should be ok to take these two medications.

## 2011-11-30 ENCOUNTER — Ambulatory Visit: Payer: Medicare Other | Admitting: Internal Medicine

## 2011-12-01 ENCOUNTER — Ambulatory Visit (INDEPENDENT_AMBULATORY_CARE_PROVIDER_SITE_OTHER): Payer: Medicare Other | Admitting: *Deleted

## 2011-12-01 DIAGNOSIS — Z7901 Long term (current) use of anticoagulants: Secondary | ICD-10-CM

## 2011-12-01 DIAGNOSIS — I4891 Unspecified atrial fibrillation: Secondary | ICD-10-CM

## 2011-12-07 ENCOUNTER — Encounter: Payer: Self-pay | Admitting: Internal Medicine

## 2011-12-07 ENCOUNTER — Ambulatory Visit (INDEPENDENT_AMBULATORY_CARE_PROVIDER_SITE_OTHER): Payer: Medicare Other | Admitting: Internal Medicine

## 2011-12-07 VITALS — BP 110/62 | HR 70 | Temp 98.1°F | Ht 63.0 in

## 2011-12-07 DIAGNOSIS — N390 Urinary tract infection, site not specified: Secondary | ICD-10-CM

## 2011-12-07 DIAGNOSIS — M545 Low back pain: Secondary | ICD-10-CM

## 2011-12-07 DIAGNOSIS — N6320 Unspecified lump in the left breast, unspecified quadrant: Secondary | ICD-10-CM

## 2011-12-07 DIAGNOSIS — N63 Unspecified lump in unspecified breast: Secondary | ICD-10-CM

## 2011-12-07 MED ORDER — OXYCODONE-ACETAMINOPHEN 10-650 MG PO TABS: 1.0000 | ORAL_TABLET | Freq: Four times a day (QID) | ORAL | Status: DC | PRN

## 2011-12-07 MED ORDER — FENTANYL 50 MCG/HR TD PT72: 1.0000 | MEDICATED_PATCH | TRANSDERMAL | Status: DC

## 2011-12-07 MED ORDER — CIPROFLOXACIN HCL 250 MG PO TABS: 250.0000 mg | ORAL_TABLET | Freq: Two times a day (BID) | ORAL | Status: AC

## 2011-12-07 NOTE — Assessment & Plan Note (Signed)
Chronic problems - narcotics maintained for same So long as no escalating dose or change in pain, i will continue to fill medications as ongoing hx vert compression fx and VP 08/2010 offered refer to pain clinic referral and pt declines " i have tried 2 before and they don't work" AGAIN,  i declined increase in dose/supply

## 2011-12-07 NOTE — Progress Notes (Addendum)
  Subjective:    Patient ID: [name], female    DOB: 01-21-1933, 78 y.o.   MRN: [id number]  HPI  complains of left breast mass Sustained by injury to breast >77mo ago Reports "nodule" due to same - moving from lateral side to inner breast fold Has had several mammo for same - last 08/10/11  Also requests pain med refills  Also concerned with chest congestion  Past Medical History  Diagnosis Date  . HTN (hypertension)   . Hyperlipidemia   . Diabetes mellitus, type 2   . Paroxysmal atrial fibrillation     chronic anticoag - follows [facility]  . Right bundle branch block and incomplete left bundle branch block     with left axis deviation  . Chronic back pain     narcotic dependence  . Lumbar disc disease     dr. Dutch Quint  . Mitral valve prolapse   . Macular degeneration   . Gastritis     RUT negative, 2005  . Diverticulosis   . Alcohol abuse   . Compression fracture 02/2011    s/p KP     Review of Systems  Constitutional: Negative for fever, activity change, appetite change, fatigue and unexpected weight change.  Respiratory: Positive for cough. Negative for chest tightness, shortness of breath and wheezing.        Objective:   Physical Exam BP 110/62  Pulse 70  Temp 98.1 F (36.7 C) (Oral)  Ht 5\' 3"  (1.6 m)  SpO2 93% Gen: NAD - tremulous and kyphotic without change Breasts: atrophic breasts - nodular glandular tissue at 9 o'clock L breast and 1 o'clock (supervised by Orlan Leavens, CMA) Lungs: CTA B CV: RRR, no edema  Lab Results  Component Value Date   WBC 7.9 11/16/2011   HGB 14.6 11/16/2011   HCT 43.3 11/16/2011   PLT 194.0 11/16/2011   GLUCOSE 58* 09/29/2010   CHOL 127 11/16/2011   TRIG 143.0 11/16/2011   HDL 48.50 11/16/2011   LDLCALC 50 11/16/2011   ALT 20 11/16/2011   AST 30 11/16/2011   NA 139 09/29/2010   K 5.4* 09/29/2010   CL 100 09/29/2010   CREATININE 0.6 09/29/2010   BUN 9 09/29/2010   CO2 28 09/29/2010   TSH 0.74 11/16/2011   INR 2.5 12/01/2011   HGBA1C 5.5  11/16/2011   MICROALBUR 0.8 10/01/2009       Assessment & Plan:  L breast "mass" - reviewed normal mammo 07/2011 - pt insists this is new change - check dx mammo for reassurance  Chest "congestion" - exam clear - reassurance provided  Dysuria - hx UTI - repeat Cipro

## 2011-12-07 NOTE — Patient Instructions (Signed)
It was good to see you today. we'll make referral to [facility] for repeat mammogram testing . Our office will contact you regarding appointment(s) once made. Bladder infection antibiotics and refill on pain meds (for 12/17/11)

## 2011-12-08 ENCOUNTER — Telehealth: Payer: Self-pay | Admitting: Internal Medicine

## 2011-12-08 NOTE — Telephone Encounter (Signed)
I reviewed with Dr Graciela Husbands. Dr Graciela Husbands suggested sooner appt for pt with PA since he is going to be out of the office for the next couple of weeks. Pt declined appt with PA. I have given pt an appt with Dr Graciela Husbands 01/27/12. Pt will call back if she changes her mind and wants sooner appt with PA.

## 2011-12-08 NOTE — Telephone Encounter (Signed)
Spoke with pt. Pt states on Monday while sitting  she had tightness in her chest that she estimates lasted about hour.

## 2011-12-08 NOTE — Telephone Encounter (Signed)
Pt states that she took a Valium. She took a nap and felt better when she woke up. Pt states she did not have any other symptoms including SOB/nausea/palpitations/lightheadedness. She states she has never experienced symptoms like this before. She has felt fine since this happened on Monday and actually saw Dr Felicity Coyer yesterday. She states she did not discuss this with Dr Felicity Coyer yesterday because there were so many other things going on. Pt is requesting an appt with Dr Graciela Husbands. I will review with Dr Graciela Husbands.

## 2011-12-08 NOTE — Telephone Encounter (Signed)
Pt had an tightness in chest on Monday, no other symptoms at that time or since, wants to see [facility], [place]

## 2011-12-22 LAB — HM MAMMOGRAPHY

## 2011-12-23 ENCOUNTER — Encounter: Payer: Self-pay | Admitting: Internal Medicine

## 2011-12-29 ENCOUNTER — Telehealth: Payer: Self-pay | Admitting: Internal Medicine

## 2011-12-29 NOTE — Telephone Encounter (Signed)
The pt called hoping to speak with you over the phone regarding a referral.  I offered her an apt, but she stated she would prefer a phone call.  She did not give specifics on what questions she needed answering.  She is hoping for the call tomorrow (Friday) morning at [phone].   Thanks!

## 2011-12-30 NOTE — Telephone Encounter (Signed)
Please call this pt on my behalf

## 2011-12-30 NOTE — Telephone Encounter (Signed)
Pt needed clarification in referral discussed at last OV - pain clinic. Pt again declined.

## 2012-01-04 ENCOUNTER — Ambulatory Visit (INDEPENDENT_AMBULATORY_CARE_PROVIDER_SITE_OTHER): Payer: Medicare Other | Admitting: *Deleted

## 2012-01-04 DIAGNOSIS — Z7901 Long term (current) use of anticoagulants: Secondary | ICD-10-CM

## 2012-01-04 DIAGNOSIS — I4891 Unspecified atrial fibrillation: Secondary | ICD-10-CM

## 2012-01-04 LAB — POCT INR: INR: 3.3

## 2012-01-06 ENCOUNTER — Other Ambulatory Visit: Payer: Self-pay | Admitting: *Deleted

## 2012-01-06 MED ORDER — TEMAZEPAM 30 MG PO CAPS: 30.0000 mg | ORAL_CAPSULE | Freq: Every evening | ORAL | Status: DC | PRN

## 2012-01-06 NOTE — Telephone Encounter (Signed)
R'cd fax from [facility] on [address] for refill of Temazepam-last written 07/11/2011 #30 with 5 refills-please advise in VAL's absence.

## 2012-01-06 NOTE — Telephone Encounter (Signed)
i printed 

## 2012-01-06 NOTE — Telephone Encounter (Signed)
Rx faxed to [facility].  

## 2012-01-07 ENCOUNTER — Other Ambulatory Visit: Payer: Self-pay | Admitting: Internal Medicine

## 2012-01-11 ENCOUNTER — Telehealth: Payer: Self-pay | Admitting: Internal Medicine

## 2012-01-11 MED ORDER — FENTANYL 50 MCG/HR TD PT72: 1.0000 | MEDICATED_PATCH | TRANSDERMAL | Status: DC

## 2012-01-11 MED ORDER — OXYCODONE-ACETAMINOPHEN 10-650 MG PO TABS: 1.0000 | ORAL_TABLET | Freq: Four times a day (QID) | ORAL | Status: DC | PRN

## 2012-01-11 NOTE — Telephone Encounter (Signed)
The pt called and is hoping to get written rx refills for her pain patches and for her hydrocodone.    Thanks!

## 2012-01-11 NOTE — Telephone Encounter (Signed)
Notified pt rx's ready for pick=up.../LMB 

## 2012-01-11 NOTE — Telephone Encounter (Signed)
Her next 30d supply can be filled 01/16/12 -  rx signed for pick up

## 2012-01-18 ENCOUNTER — Encounter: Payer: Self-pay | Admitting: General Practice

## 2012-01-27 ENCOUNTER — Ambulatory Visit (INDEPENDENT_AMBULATORY_CARE_PROVIDER_SITE_OTHER): Payer: Medicare Other | Admitting: Pharmacist

## 2012-01-27 ENCOUNTER — Ambulatory Visit (INDEPENDENT_AMBULATORY_CARE_PROVIDER_SITE_OTHER): Payer: Medicare Other | Admitting: Internal Medicine

## 2012-01-27 ENCOUNTER — Encounter: Payer: Self-pay | Admitting: Internal Medicine

## 2012-01-27 VITALS — BP 98/50 | HR 61 | Ht 65.0 in | Wt 108.0 lb

## 2012-01-27 DIAGNOSIS — Z7901 Long term (current) use of anticoagulants: Secondary | ICD-10-CM

## 2012-01-27 DIAGNOSIS — I4891 Unspecified atrial fibrillation: Secondary | ICD-10-CM

## 2012-01-27 NOTE — Progress Notes (Signed)
HPI  [name] is a 78 y.o. female is seen in followup for paroxysmal atrial fibrillation with a rapid ventricular response. This occurs in the context of hypertension and diabetes.   Her rhythm is stable    Past Medical History  Diagnosis Date  . HTN (hypertension)   . Hyperlipidemia   . Diabetes mellitus, type 2   . Paroxysmal atrial fibrillation     chronic anticoag - follows [facility]  . Right bundle branch block and incomplete left bundle branch block     with left axis deviation  . Chronic back pain     narcotic dependence  . Lumbar disc disease     dr. Dutch Quint  . Mitral valve prolapse   . Macular degeneration   . Gastritis     RUT negative, 2005  . Diverticulosis   . Alcohol abuse   . Compression fracture 02/2011    s/p KP    Past Surgical History  Procedure Date  . Right ankle surgery   . Knee arthroscopy     Current Outpatient Prescriptions  Medication Sig Dispense Refill  . b complex vitamins tablet Take 1 tablet by mouth daily.      . Cholecalciferol (VITAMIN D) 1000 UNITS capsule Take 1,000 Units by mouth daily.        . diazepam (VALIUM) 5 MG tablet Take 1 tablet (5 mg total) by mouth 4 (four) times daily as needed for anxiety.  120 tablet  5  . fentaNYL (DURAGESIC - DOSED MCG/HR) 50 MCG/HR Place 1 patch (50 mcg total) onto the skin every 3 (three) days.  10 patch  0  . glimepiride (AMARYL) 4 MG tablet TAKE 1/2 TAB BY MOUTH TWO TIMES A DAY  30 tablet  10  . glucose blood (ONE TOUCH TEST STRIPS) test strip 1 each by Other route as needed. Use as instructed       . metFORMIN (GLUCOPHAGE) 500 MG tablet TAKE 1 TABLET TWICE DAILY  60 tablet  5  . metoprolol tartrate (LOPRESSOR) 25 MG tablet Take 1/2 tab by mouth twice daily  30 tablet  6  . oxyCODONE-acetaminophen (PERCOCET) 10-650 MG per tablet Take 1 tablet by mouth every 6 (six) hours as needed.  120 tablet  0  . PARoxetine (PAXIL) 40 MG tablet TAKE 1 TABLET EVERY DAY  90 tablet  1  . pravastatin (PRAVACHOL)  80 MG tablet Take 1 tablet (80 mg total) by mouth daily.  30 tablet  10  . psyllium (METAMUCIL MULTIHEALTH FIBER) 58.6 % powder Take 1 packet by mouth daily.        . ramipril (ALTACE) 5 MG capsule Take 1 capsule (5 mg total) by mouth daily.  30 capsule  5  . temazepam (RESTORIL) 30 MG capsule Take 1 capsule (30 mg total) by mouth at bedtime as needed.  30 capsule  5  . verapamil (CALAN-SR) 180 MG CR tablet TAKE 1 TABLET BY MOUTH TWICE A DAY  180 tablet  3  . warfarin (COUMADIN) 5 MG tablet THIS IS 3 MONTHS SUPPLY  TAKE AS DIRECTED BY COUMADIN CLINIC  90 tablet  0    Allergies  Allergen Reactions  . Codeine   . Morphine And Related Other (See Comments)    Pt had severe respiratory depression and excessive sedation during a recent hospitalization and was told it was 2/2 morphine.    Review of Systems negative except from HPI and PMH  Physical Exam BP 98/50  Pulse 61  Ht 5\' 5"  (1.651 m)  Wt 108 lb (48.988 kg)  BMI 17.97 kg/m2 Well developed and well nourished in no acute distress  full Clear to ausculation Regular rate and rhythm, no murmurs gallops or rub Soft with active bowel sounds No clubbing cyanosis none Edema Alert and oriented, she is sitting in a wheelchair Skin Warm and Dry  Sinus rhythm with left axis deviation. Intervals 0.16/0.12/0.45 There is poor R wave progression consistent with an  left bundle branch block Assessment and  Plan

## 2012-01-27 NOTE — Assessment & Plan Note (Signed)
Holding sinus with out significant bradycardia

## 2012-01-27 NOTE — Patient Instructions (Signed)
Your physician wants you to follow-up in: 6 months with Dr. Klein. You will receive a reminder letter in the mail two months in advance. If you don't receive a letter, please call our office to schedule the follow-up appointment.  Your physician recommends that you continue on your current medications as directed. Please refer to the Current Medication list given to you today.  

## 2012-02-13 ENCOUNTER — Other Ambulatory Visit: Payer: Self-pay

## 2012-02-13 MED ORDER — FENTANYL 50 MCG/HR TD PT72: 1.0000 | MEDICATED_PATCH | TRANSDERMAL | Status: DC

## 2012-02-13 MED ORDER — OXYCODONE-ACETAMINOPHEN 10-650 MG PO TABS: 1.0000 | ORAL_TABLET | Freq: Four times a day (QID) | ORAL | Status: DC | PRN

## 2012-02-13 NOTE — Telephone Encounter (Signed)
Pt informed, Rx in cabinet for pt pick up  

## 2012-02-13 NOTE — Telephone Encounter (Signed)
ok 

## 2012-02-15 ENCOUNTER — Other Ambulatory Visit: Payer: Self-pay | Admitting: Internal Medicine

## 2012-02-20 ENCOUNTER — Ambulatory Visit (INDEPENDENT_AMBULATORY_CARE_PROVIDER_SITE_OTHER): Payer: Medicare Other | Admitting: General Practice

## 2012-02-20 ENCOUNTER — Telehealth: Payer: Self-pay | Admitting: Internal Medicine

## 2012-02-20 ENCOUNTER — Ambulatory Visit (INDEPENDENT_AMBULATORY_CARE_PROVIDER_SITE_OTHER): Payer: Medicare Other | Admitting: Internal Medicine

## 2012-02-20 ENCOUNTER — Other Ambulatory Visit (INDEPENDENT_AMBULATORY_CARE_PROVIDER_SITE_OTHER): Payer: Medicare Other

## 2012-02-20 ENCOUNTER — Ambulatory Visit (INDEPENDENT_AMBULATORY_CARE_PROVIDER_SITE_OTHER)
Admission: RE | Admit: 2012-02-20 | Discharge: 2012-02-20 | Disposition: A | Discharge status: Home / Self Care | Payer: Medicare Other | Source: Ambulatory Visit | Attending: Internal Medicine | Admitting: Internal Medicine

## 2012-02-20 ENCOUNTER — Encounter: Payer: Self-pay | Admitting: Internal Medicine

## 2012-02-20 VITALS — BP 102/68 | HR 66 | Temp 98.0°F

## 2012-02-20 DIAGNOSIS — I4891 Unspecified atrial fibrillation: Secondary | ICD-10-CM

## 2012-02-20 DIAGNOSIS — M545 Low back pain: Secondary | ICD-10-CM

## 2012-02-20 DIAGNOSIS — Z23 Encounter for immunization: Secondary | ICD-10-CM

## 2012-02-20 DIAGNOSIS — R300 Dysuria: Secondary | ICD-10-CM

## 2012-02-20 DIAGNOSIS — Z7901 Long term (current) use of anticoagulants: Secondary | ICD-10-CM

## 2012-02-20 LAB — BASIC METABOLIC PANEL
BUN: 12 mg/dL (ref 6–23)
CO2: 30 mEq/L (ref 19–32)
Calcium: 9.4 mg/dL (ref 8.4–10.5)
Chloride: 100 mEq/L (ref 96–112)
Creatinine, Ser: 0.6 mg/dL (ref 0.4–1.2)
GFR: 113.98 mL/min (ref >60.00)
Glucose, Bld: 42 mg/dL — CL (ref 70–99)
Potassium: 4.8 mEq/L (ref 3.5–5.1)
Sodium: 137 mEq/L (ref 135–145)

## 2012-02-20 LAB — URINALYSIS, ROUTINE W REFLEX MICROSCOPIC
Bilirubin Urine: NEGATIVE
Hgb urine dipstick: NEGATIVE
Ketones, ur: NEGATIVE
Nitrite: NEGATIVE
Specific Gravity, Urine: 1.01 (ref 1.000–1.030)
Total Protein, Urine: NEGATIVE
Urine Glucose: NEGATIVE
Urobilinogen, UA: 0.2 (ref 0.0–1.0)
pH: 7.5 (ref 5.0–8.0)

## 2012-02-20 LAB — HEMOGLOBIN A1C: Hgb A1c MFr Bld: 5.2 % (ref 4.6–6.5)

## 2012-02-20 MED ORDER — AMOXICILLIN 500 MG PO CAPS: 500.0000 mg | ORAL_CAPSULE | Freq: Three times a day (TID) | ORAL | Status: DC

## 2012-02-20 MED ORDER — CIPROFLOXACIN HCL 250 MG PO TABS: 250.0000 mg | ORAL_TABLET | Freq: Two times a day (BID) | ORAL | Status: DC

## 2012-02-20 NOTE — Assessment & Plan Note (Signed)
On metformin and amaryl for same Reports home cbgs 70-100 fasting when she checks Recheck a1c now Lab Results  Component Value Date   HGBA1C 5.5 11/16/2011

## 2012-02-20 NOTE — Telephone Encounter (Signed)
Pt wants an alternative antibiotic to Cipro, she states that she has taken it in the last few times she's had a UTI but it has not worked for her.

## 2012-02-20 NOTE — Progress Notes (Signed)
  Subjective:    Patient ID: [name], female    DOB: Sep 30, 1932, 78 y.o.   MRN: [id number]  HPI here for follow up - reviewed chronic medical issues:  complains of left breast mass Reports she sustained by injury to breast >65mo ago Reports "nodule" due to same - moves from lateral side to inner breast fold Has had several mammo for same - last at [facility] 12/22/11 and 08/10/11  Also requests pain med refills  diabetes mellitus - does not check cbgs - the patient reports compliance with medication(s) as prescribed. Denies adverse side effects.   Past Medical History  Diagnosis Date  . HTN (hypertension)   . Hyperlipidemia   . Diabetes mellitus, type 2   . Paroxysmal atrial fibrillation     chronic anticoag - follows [facility]  . Right bundle branch block and incomplete left bundle branch block     with left axis deviation  . Chronic back pain     narcotic dependence  . Lumbar disc disease     dr. Dutch Quint  . Mitral valve prolapse   . Macular degeneration   . Gastritis     RUT negative, 2005  . Diverticulosis   . Alcohol abuse   . Compression fracture 02/2011    s/p KP     Review of Systems  Constitutional: Negative for fever, activity change, appetite change, fatigue and unexpected weight change.  Respiratory: Negative for cough, chest tightness, shortness of breath and wheezing.   Cardiovascular: Negative for chest pain and palpitations.  Genitourinary: Positive for dysuria and urgency.  Musculoskeletal: Positive for myalgias and back pain.       Objective:   Physical Exam  BP 102/68  Pulse 66  Temp 98 F (36.7 C) (Oral)  SpO2 92% Gen: NAD - tremulous and kyphotic without change Breasts: atrophic breasts - nodular glandular tissue at 9 o'clock L breast and 1 o'clock (supervised by CMA) Lungs: CTA B CV: RRR, no edema Psychiatric - intermittently tearful, rambling and tangential - oriented to person, place but poor short term recall (0/3 at 5 min)  Lab Results    Component Value Date   WBC 7.9 11/16/2011   HGB 14.6 11/16/2011   HCT 43.3 11/16/2011   PLT 194.0 11/16/2011   GLUCOSE 58* 09/29/2010   CHOL 127 11/16/2011   TRIG 143.0 11/16/2011   HDL 48.50 11/16/2011   LDLCALC 50 11/16/2011   ALT 20 11/16/2011   AST 30 11/16/2011   NA 139 09/29/2010   K 5.4* 09/29/2010   CL 100 09/29/2010   CREATININE 0.6 09/29/2010   BUN 9 09/29/2010   CO2 28 09/29/2010   TSH 0.74 11/16/2011   INR 2.2 02/20/2012   HGBA1C 5.5 11/16/2011   MICROALBUR 0.8 10/01/2009       Assessment & Plan:  See problem list. Medications and labs reviewed today.  L breast "mass" - recurrent concerns exacerbated by memory issues - reviewed normal mammo 07/2011 and 12/2011 - again, pt insists this is new change - The patient is reassured that these symptoms do not appear to represent a serious or threatening condition.  Dysuria, check UA - hx UTI - empiric Cipro

## 2012-02-20 NOTE — Telephone Encounter (Signed)
Please send antibiotic to [facility] at [place].

## 2012-02-20 NOTE — Assessment & Plan Note (Signed)
Chronic problems - narcotics maintained for same So long as no escalating dose or change in pain, i will continue to fill medications as ongoing hx vert compression fx and VP 08/2010 - recheck xray now as reports increase in upper lumbar spine She declines referal to pain clinic: " i have tried 2 clinics before and they don't work" AGAIN,  i declined increase in dose/supply

## 2012-02-20 NOTE — Telephone Encounter (Signed)
Use amox, not cipro - erx done to [facility]

## 2012-02-20 NOTE — Assessment & Plan Note (Signed)
Chronic anticoag - follows with [facility] and [facility] for same The current medical regimen is effective;  continue present plan and medications.  Lab Results  Component Value Date   INR 2.2 02/20/2012   INR 4.1 01/27/2012   INR 3.3 01/04/2012   PROTIME 19.5 10/10/2008

## 2012-02-20 NOTE — Patient Instructions (Addendum)
It was good to see you today. We have reviewed your prior records including labs and tests today Test(s) ordered today. Your results will be released to MyChart (or called to you) after review, usually within 72hours after test completion. If any changes need to be made, you will be notified at that same time. Cipro for Bladder infection antibiotics  Flu and pneumonia shots done today Please schedule followup in 4 months, call sooner if problems.

## 2012-02-21 NOTE — Telephone Encounter (Signed)
Pt informed of new rx for ABX.  

## 2012-02-28 ENCOUNTER — Other Ambulatory Visit: Payer: Self-pay | Admitting: Internal Medicine

## 2012-02-29 ENCOUNTER — Telehealth: Payer: Self-pay | Admitting: Internal Medicine

## 2012-02-29 NOTE — Telephone Encounter (Signed)
ERROR

## 2012-02-29 NOTE — Telephone Encounter (Signed)
Pt called and req for more

## 2012-03-06 ENCOUNTER — Other Ambulatory Visit: Payer: Self-pay | Admitting: Internal Medicine

## 2012-03-09 ENCOUNTER — Other Ambulatory Visit: Payer: Self-pay | Admitting: Internal Medicine

## 2012-03-09 NOTE — Telephone Encounter (Signed)
[facility].../[facility]mb 

## 2012-03-13 ENCOUNTER — Ambulatory Visit (INDEPENDENT_AMBULATORY_CARE_PROVIDER_SITE_OTHER): Payer: Medicare Other | Admitting: Internal Medicine

## 2012-03-13 ENCOUNTER — Encounter: Payer: Self-pay | Admitting: Internal Medicine

## 2012-03-13 VITALS — BP 124/52 | HR 81 | Temp 98.4°F | Ht 63.0 in

## 2012-03-13 DIAGNOSIS — R300 Dysuria: Secondary | ICD-10-CM

## 2012-03-13 DIAGNOSIS — M545 Low back pain: Secondary | ICD-10-CM

## 2012-03-13 MED ORDER — OXYCODONE-ACETAMINOPHEN 10-650 MG PO TABS: 1.0000 | ORAL_TABLET | Freq: Four times a day (QID) | ORAL | Status: DC | PRN

## 2012-03-13 MED ORDER — AMOXICILLIN 500 MG PO CAPS: 500.0000 mg | ORAL_CAPSULE | Freq: Three times a day (TID) | ORAL | Status: DC

## 2012-03-13 MED ORDER — FENTANYL 50 MCG/HR TD PT72: 1.0000 | MEDICATED_PATCH | TRANSDERMAL | Status: DC

## 2012-03-13 NOTE — Assessment & Plan Note (Signed)
Chronic problems - narcotics maintained for same So long as no escalating dose or change in pain, i will fill medications as ongoing hx vert compression fx and VP 08/2010 -  She declines referal to pain clinic: " i have tried 2 clinics before and they don't work" AGAIN,  i declined increase in dose/supply

## 2012-03-13 NOTE — Progress Notes (Signed)
  Subjective:    Patient ID: [name], female    DOB: 1933/01/18, 78 y.o.   MRN: [id number]  HPI here for follow up - reviewed chronic medical issues:  Also requests pain med refills  diabetes mellitus - infrequently check cbgs - complains of symptomatic hypoglycemia - the patient reports compliance with medication(s) as prescribed. Denies adverse side effects.   Past Medical History  Diagnosis Date  . HTN (hypertension)   . Hyperlipidemia   . Diabetes mellitus, type 2   . Paroxysmal atrial fibrillation     chronic anticoag - follows [facility]  . Right bundle branch block and incomplete left bundle branch block     with left axis deviation  . Chronic back pain     narcotic dependence  . Lumbar disc disease     dr. Dutch Quint  . Mitral valve prolapse   . Macular degeneration   . Gastritis     RUT negative, 2005  . Diverticulosis   . Alcohol abuse   . Compression fracture 02/2011    s/p KP     Review of Systems  Constitutional: Negative for fever, activity change, appetite change and fatigue.  Respiratory: Negative for cough, chest tightness, shortness of breath and wheezing.   Cardiovascular: Negative for chest pain and palpitations.  Genitourinary: Positive for dysuria and urgency.  Musculoskeletal: Positive for myalgias and back pain.  Neurological: Positive for dizziness.       Objective:   Physical Exam  BP 124/52  Pulse 81  Temp 98.4 F (36.9 C) (Oral)  Ht 5\' 3"  (1.6 m)  SpO2 94% Gen: NAD - tremulous and kyphotic without change Lungs: CTA B CV: RRR, no edema Neuro: AAOx3, no CN deficits - MAE and gait/balance unchanged Psychiatric - rambling and tangential - oriented to person, place but poor short term recall (0/3 at 5 min)  Lab Results  Component Value Date   WBC 7.9 11/16/2011   HGB 14.6 11/16/2011   HCT 43.3 11/16/2011   PLT 194.0 11/16/2011   GLUCOSE 42* 02/20/2012   CHOL 127 11/16/2011   TRIG 143.0 11/16/2011   HDL 48.50 11/16/2011   LDLCALC 50 11/16/2011   ALT 20 11/16/2011   AST 30 11/16/2011   NA 137 02/20/2012   K 4.8 02/20/2012   CL 100 02/20/2012   CREATININE 0.6 02/20/2012   BUN 12 02/20/2012   CO2 30 02/20/2012   TSH 0.74 11/16/2011   INR 2.2 02/20/2012   HGBA1C 5.2 02/20/2012   MICROALBUR 0.8 10/01/2009       Assessment & Plan:  See problem list. Medications and labs reviewed today.  Dysuria, check UA - hx UTI - empiric antibiotics

## 2012-03-13 NOTE — Assessment & Plan Note (Signed)
On metformin and amaryl for same Will stop OHA due to hypoglycemic symptoms  Reports home cbgs 50-70 fasting when she checks  Lab Results  Component Value Date   HGBA1C 5.2 02/20/2012

## 2012-03-13 NOTE — Patient Instructions (Addendum)
It was good to see you today. We have reviewed your prior records including labs and tests today Stop Amaryl (glimperide) due to low sugar and dizziness symptoms and continue only metformin for diabetes  Amoxicillin for Bladder infection antibiotics  Refills on pain medication provided today  Please keep schedule followup, call sooner if problems.

## 2012-03-18 ENCOUNTER — Other Ambulatory Visit: Payer: Self-pay | Admitting: Internal Medicine

## 2012-03-21 ENCOUNTER — Encounter: Payer: Self-pay | Admitting: Internal Medicine

## 2012-03-21 ENCOUNTER — Ambulatory Visit (INDEPENDENT_AMBULATORY_CARE_PROVIDER_SITE_OTHER): Payer: Medicare Other | Admitting: Internal Medicine

## 2012-03-21 ENCOUNTER — Ambulatory Visit (INDEPENDENT_AMBULATORY_CARE_PROVIDER_SITE_OTHER): Payer: Medicare Other | Admitting: General Practice

## 2012-03-21 VITALS — BP 120/62 | HR 67 | Temp 97.6°F | Ht 63.0 in | Wt 116.0 lb

## 2012-03-21 DIAGNOSIS — I4891 Unspecified atrial fibrillation: Secondary | ICD-10-CM

## 2012-03-21 DIAGNOSIS — N3090 Cystitis, unspecified without hematuria: Secondary | ICD-10-CM

## 2012-03-21 DIAGNOSIS — N3281 Overactive bladder: Secondary | ICD-10-CM

## 2012-03-21 DIAGNOSIS — N318 Other neuromuscular dysfunction of bladder: Secondary | ICD-10-CM

## 2012-03-21 DIAGNOSIS — IMO0001 Reserved for inherently not codable concepts without codable children: Secondary | ICD-10-CM

## 2012-03-21 DIAGNOSIS — Z7901 Long term (current) use of anticoagulants: Secondary | ICD-10-CM

## 2012-03-21 MED ORDER — PHENAZOPYRIDINE HCL 200 MG PO TABS
200.0000 mg | ORAL_TABLET | Freq: Three times a day (TID) | ORAL | Status: DC | PRN
Start: 2012-03-21 — End: 2012-05-23

## 2012-03-21 MED ORDER — NITROFURANTOIN MONOHYD MACRO 100 MG PO CAPS: 100.0000 mg | ORAL_CAPSULE | Freq: Two times a day (BID) | ORAL | Status: DC

## 2012-03-21 MED ORDER — SOLIFENACIN SUCCINATE 5 MG PO TABS: 5.0000 mg | ORAL_TABLET | Freq: Every day | ORAL | Status: DC

## 2012-03-21 NOTE — Progress Notes (Signed)
  Subjective:    Patient ID: [name], female    DOB: 1933/01/22, 78 y.o.   MRN: [id number]  Urinary Frequency  Associated symptoms include frequency and urgency.   diabetes mellitus -no further symptomatic hypoglycemia since stopping Amaryl last week- the patient reports compliance with medication(s) as prescribed. Denies adverse side effects.   Past Medical History  Diagnosis Date  . HTN (hypertension)   . Hyperlipidemia   . Diabetes mellitus, type 2   . Paroxysmal atrial fibrillation     chronic anticoag - follows [facility]  . Right bundle branch block and incomplete left bundle branch block     with left axis deviation  . Chronic back pain     narcotic dependence  . Lumbar disc disease     dr. Dutch Quint  . Mitral valve prolapse   . Macular degeneration   . Gastritis     RUT negative, 2005  . Diverticulosis   . Alcohol abuse   . Compression fracture 02/2011    s/p KP     Review of Systems  Constitutional: Negative for fever, activity change, appetite change and fatigue.  Respiratory: Negative for cough, chest tightness, shortness of breath and wheezing.   Cardiovascular: Negative for chest pain and palpitations.  Genitourinary: Positive for dysuria, urgency and frequency.  Musculoskeletal: Positive for myalgias and back pain.  Neurological: Positive for dizziness.       Objective:   Physical Exam  BP 120/62  Pulse 67  Temp 97.6 F (36.4 C) (Oral)  Ht 5\' 3"  (1.6 m)  Wt 116 lb (52.617 kg)  BMI 20.55 kg/m2  SpO2 94% Gen: NAD - tremulous and kyphotic without change Lungs: CTA B CV: RRR, no edema Neuro: AAOx3, no CN deficits - MAE and gait/balance unchanged Psychiatric - rambling and tangential - oriented to person, place but poor short term recall (0/3 at 5 min)  Lab Results  Component Value Date   WBC 7.9 11/16/2011   HGB 14.6 11/16/2011   HCT 43.3 11/16/2011   PLT 194.0 11/16/2011   GLUCOSE 42* 02/20/2012   CHOL 127 11/16/2011   TRIG 143.0 11/16/2011   HDL 48.50  11/16/2011   LDLCALC 50 11/16/2011   ALT 20 11/16/2011   AST 30 11/16/2011   NA 137 02/20/2012   K 4.8 02/20/2012   CL 100 02/20/2012   CREATININE 0.6 02/20/2012   BUN 12 02/20/2012   CO2 30 02/20/2012   TSH 0.74 11/16/2011   INR 2.8 03/21/2012   HGBA1C 5.2 02/20/2012   MICROALBUR 0.8 10/01/2009       Assessment & Plan:  See problem list. Medications and labs reviewed today.  Dysuria, chronic cystitis - hx UTI - empiric antibiotics and pyridium

## 2012-03-21 NOTE — Assessment & Plan Note (Signed)
On metformin solo - stopped amaryl for dizziness and hypoglycemia symptoms in 02/2012 Reports home cbgs 90-120 fasting since stopping OHA 02/2012 (last week)   Lab Results  Component Value Date   HGBA1C 5.2 02/20/2012

## 2012-03-21 NOTE — Patient Instructions (Signed)
It was good to see you today. Take nitrofurantoin antibiotics and Pyridium for bladder symptoms over next several days as discussed Start Vesicare every day for the next 30 days to treat overactive bladder symptoms - we'll plan to continue on a daily basis if bladder symptoms improved with this medication Your prescription(s) have been submitted to your pharmacy. Please take as directed and contact our office if you believe you are having problem(s) with the medication(s). Other medications reviewed and updated, no other prescription change

## 2012-03-21 NOTE — Assessment & Plan Note (Signed)
Start Vesicare for bladder irritability - erx done

## 2012-03-23 ENCOUNTER — Telehealth: Payer: Self-pay | Admitting: Internal Medicine

## 2012-03-23 MED ORDER — PRAVASTATIN SODIUM 80 MG PO TABS: ORAL_TABLET | ORAL | Status: DC

## 2012-03-23 NOTE — Telephone Encounter (Signed)
Pt needs refill of pravastatin [facility] , will be out on Sunday, can call in asap today??

## 2012-04-06 ENCOUNTER — Other Ambulatory Visit: Payer: Self-pay | Admitting: Internal Medicine

## 2012-04-16 ENCOUNTER — Other Ambulatory Visit: Payer: Self-pay

## 2012-04-16 MED ORDER — FENTANYL 50 MCG/HR TD PT72: 1.0000 | MEDICATED_PATCH | TRANSDERMAL | Status: DC

## 2012-04-16 MED ORDER — OXYCODONE-ACETAMINOPHEN 10-650 MG PO TABS: 1.0000 | ORAL_TABLET | Freq: Four times a day (QID) | ORAL | Status: DC | PRN

## 2012-04-16 NOTE — Telephone Encounter (Signed)
Done hardcopy to robin  

## 2012-04-16 NOTE — Telephone Encounter (Signed)
Called the patient informed that prescriptions are ready for pickup at the front desk.

## 2012-04-18 ENCOUNTER — Ambulatory Visit (INDEPENDENT_AMBULATORY_CARE_PROVIDER_SITE_OTHER): Payer: Medicare Other | Admitting: General Practice

## 2012-04-18 DIAGNOSIS — Z7901 Long term (current) use of anticoagulants: Secondary | ICD-10-CM

## 2012-04-18 DIAGNOSIS — I4891 Unspecified atrial fibrillation: Secondary | ICD-10-CM

## 2012-05-03 ENCOUNTER — Ambulatory Visit (INDEPENDENT_AMBULATORY_CARE_PROVIDER_SITE_OTHER): Payer: Medicare Other | Admitting: Internal Medicine

## 2012-05-03 ENCOUNTER — Ambulatory Visit (INDEPENDENT_AMBULATORY_CARE_PROVIDER_SITE_OTHER)
Admission: RE | Admit: 2012-05-03 | Discharge: 2012-05-03 | Disposition: A | Discharge status: Home / Self Care | Payer: Medicare Other | Source: Ambulatory Visit | Attending: Internal Medicine | Admitting: Internal Medicine

## 2012-05-03 ENCOUNTER — Encounter: Payer: Self-pay | Admitting: Internal Medicine

## 2012-05-03 ENCOUNTER — Telehealth: Payer: Self-pay | Admitting: Internal Medicine

## 2012-05-03 VITALS — BP 106/66 | HR 109 | Temp 97.5°F | Wt 116.0 lb

## 2012-05-03 DIAGNOSIS — J449 Chronic obstructive pulmonary disease, unspecified: Secondary | ICD-10-CM

## 2012-05-03 DIAGNOSIS — J209 Acute bronchitis, unspecified: Secondary | ICD-10-CM

## 2012-05-03 DIAGNOSIS — J4489 Other specified chronic obstructive pulmonary disease: Secondary | ICD-10-CM

## 2012-05-03 DIAGNOSIS — IMO0001 Reserved for inherently not codable concepts without codable children: Secondary | ICD-10-CM

## 2012-05-03 MED ORDER — CEFTRIAXONE SODIUM 1 G IJ SOLR
500.0000 mg | Freq: Once | INTRAMUSCULAR | Status: AC
  Administered 2012-05-03: 500 mg via INTRAMUSCULAR

## 2012-05-03 MED ORDER — BENZONATATE 100 MG PO CAPS: ORAL_CAPSULE | ORAL | Status: DC

## 2012-05-03 MED ORDER — LEVOFLOXACIN 250 MG PO TABS: 250.0000 mg | ORAL_TABLET | Freq: Every day | ORAL | Status: DC

## 2012-05-03 NOTE — Telephone Encounter (Signed)
Noted - agree with advise and planned OV with JJ this afternoon

## 2012-05-03 NOTE — Progress Notes (Signed)
Subjective:    Patient ID: [name], female    DOB: 08/11/1932, 78 y.o.   MRN: [id number]  HPI  Here with acute onset mild to mod 2-3 days ST, HA, general weakness and malaise, with prod cough greenish sputum, but Pt denies chest pain, increased sob or doe, wheezing, orthopnea, PND, increased LE swelling, palpitations, dizziness or syncope. Pt denies new neurological symptoms such as new headache, or facial or extremity weakness or numbness   Pt denies polydipsia, polyuria, or low sugar symptoms such as weakness or confusion improved with po intake.  Pt states overall good compliance with meds, trying to follow lower cholesterol, diabetic diet, wt overall stable but little exercise however.    Past Medical History  Diagnosis Date  . HTN (hypertension)   . Hyperlipidemia   . Diabetes mellitus, type 2   . Paroxysmal atrial fibrillation     chronic anticoag - follows [facility]  . Right bundle branch block and incomplete left bundle branch block     with left axis deviation  . Chronic back pain     narcotic dependence  . Lumbar disc disease     dr. Dutch Quint  . Mitral valve prolapse   . Macular degeneration   . Gastritis     RUT negative, 2005  . Diverticulosis   . Alcohol abuse   . Compression fracture 02/2011    s/p KP   Past Surgical History  Procedure Date  . Right ankle surgery   . Knee arthroscopy     reports that she has been smoking Cigarettes.  She has a 32.5 pack-year smoking history. She has never used smokeless tobacco. She reports that she drinks alcohol. She reports that she does not use illicit drugs. family history includes Atrial fibrillation in her sister; Cancer in her father; and Heart disease in her mother. Allergies  Allergen Reactions  . Codeine   . Morphine And Related Other (See Comments)    Pt had severe respiratory depression and excessive sedation during a recent hospitalization and was told it was 2/2 morphine.   Current Outpatient Prescriptions on File  Prior to Visit  Medication Sig Dispense Refill  . b complex vitamins tablet Take 1 tablet by mouth daily.      . Cholecalciferol (VITAMIN D) 1000 UNITS capsule Take 1,000 Units by mouth daily.        . diazepam (VALIUM) 5 MG tablet TAKE 1 TABLET FOUR TIMES A DAY AS NEEDED FOR STRESS  120 tablet  4  . fentaNYL (DURAGESIC - DOSED MCG/HR) 50 MCG/HR Place 1 patch (50 mcg total) onto the skin every 3 (three) days.  10 patch  0  . glucose blood (ONE TOUCH TEST STRIPS) test strip 1 each by Other route as needed. Use as instructed       . metFORMIN (GLUCOPHAGE) 500 MG tablet TAKE 1 TABLET TWICE DAILY  60 tablet  5  . metoprolol tartrate (LOPRESSOR) 25 MG tablet Take 1/2 tab by mouth twice daily  30 tablet  6  . nitrofurantoin, macrocrystal-monohydrate, (MACROBID) 100 MG capsule Take 1 capsule (100 mg total) by mouth 2 (two) times daily.  14 capsule  0  . oxyCODONE-acetaminophen (PERCOCET) 10-650 MG per tablet Take 1 tablet by mouth every 6 (six) hours as needed.  120 tablet  0  . PARoxetine (PAXIL) 40 MG tablet TAKE 1 TABLET EVERY DAY  90 tablet  1  . phenazopyridine (PYRIDIUM) 200 MG tablet Take 1 tablet (200 mg total) by mouth  3 (three) times daily as needed for pain (bladder).  15 tablet  0  . pravastatin (PRAVACHOL) 80 MG tablet TAKE ONCE A DAY  30 tablet  9  . psyllium (METAMUCIL MULTIHEALTH FIBER) 58.6 % powder Take 1 packet by mouth daily.        . ramipril (ALTACE) 5 MG capsule TAKE ONE CAPSULE BY MOUTH DAILY  30 capsule  2  . solifenacin (VESICARE) 5 MG tablet Take 1 tablet (5 mg total) by mouth daily.  30 tablet  3  . temazepam (RESTORIL) 30 MG capsule Take 1 capsule (30 mg total) by mouth at bedtime as needed.  30 capsule  5  . verapamil (CALAN-SR) 180 MG CR tablet TAKE 1 TABLET BY MOUTH TWICE A DAY  180 tablet  3  . warfarin (COUMADIN) 5 MG tablet THIS IS 3 MONTHS SUPPLY  TAKE AS DIRECTED BY COUMADIN CLINIC  90 tablet  0   Review of Systems  Constitutional: Negative for diaphoresis and  unexpected weight change.  HENT: Negative for tinnitus.   Eyes: Negative for photophobia and visual disturbance.  Respiratory: Negative for choking and stridor.   Gastrointestinal: Negative for vomiting and blood in stool.  Genitourinary: Negative for hematuria and decreased urine volume.  Musculoskeletal: Negative for gait problem.  Skin: Negative for color change and wound.  Psychiatric/Behavioral: Negative for decreased concentration. The patient is not hyperactive.      Objective:   Physical Exam BP 106/66  Pulse 109  Temp 97.5 F (36.4 C) (Oral)  Wt 116 lb (52.617 kg)  SpO2 91% Physical Exam  VS noted, mild ill Constitutional: Pt appears well-developed and well-nourished.  HENT: Head: Normocephalic.  Right Ear: External ear normal.  Left Ear: External ear normal.  Bilat tm's mild erythema.  Sinus nontender.  Pharynx mild erythema Eyes: Conjunctivae and EOM are normal. Pupils are equal, round, and reactive to light.  Neck: Normal range of motion. Neck supple.  Cardiovascular: Normal rate and regular rhythm.   Pulmonary/Chest: Effort normal and breath sounds decreased with few bibas crackles Neurological: Pt is alert. Not confused  Skin: Skin is warm. No erythema.  Psychiatric: Pt behavior is normal , but ? Mild dementia    Assessment & Plan:

## 2012-05-03 NOTE — Patient Instructions (Addendum)
You had the antibiotic shot today (rocephin) Take all new medications as prescribed - the antibiotic pills (levaquin), and the pills for cough (tessalon) Continue all other medications as before Please go to XRAY in the Basement for the x-ray test You will be contacted by phone if any changes need to be made immediately.  Otherwise, you will receive a letter about your results with an explanation, but please check with MyChart first. Thank you for enrolling in MyChart. Please follow the instructions below to securely access your online medical record. MyChart allows you to send messages to your doctor, view your test results, renew your prescriptions, schedule appointments, and more. To Log into MyChart, please go to [email or web address], and your Username is: [name] (the SAME is your password) Please call in 2-3 days if you are not feeling better, or if you feel worse at any time with breathing, pain, or weakness with falling, you should go to the Emergency Room for further evaluation

## 2012-05-03 NOTE — Assessment & Plan Note (Signed)
O/w stable overall by hx and exam, most recent data reviewed with pt, and pt to continue medical treatment as before SpO2 Readings from Last 3 Encounters:  05/03/12 91%  03/21/12 94%  03/13/12 94%

## 2012-05-03 NOTE — Assessment & Plan Note (Signed)
Mod it seems, cant r/o pna - for rocephin IM, and po antibx course,  to f/u any worsening symptoms or concerns, for cxr

## 2012-05-03 NOTE — Assessment & Plan Note (Signed)
stable overall by hx and exam, most recent data reviewed with pt, and pt to continue medical treatment as before Lab Results  Component Value Date   HGBA1C 5.2 02/20/2012

## 2012-05-03 NOTE — Telephone Encounter (Signed)
Patient Information:  Caller Name: Gabriel Rung  Phone: [phone]  Patient: [name]  Gender: Female  DOB: Jan 20, 1933  Age: 78 Years  PCP: Rene Paci (Adults only)  Office Follow Up:  Does the office need to follow up with this patient?: Yes  Instructions For The Office: Husband states wife with cold symptoms worsening. "gurgling in chest" No wheezing. No fever.   Symptoms  Reason For Call & Symptoms: Wife has had a cold for several days.  Yesterday 05/02/12 her Energy level decreased,  Sleeping more today, gurlging in her chest. He does not hear wheezing only gurgling, intermittent cough.  He states he actually has to assist her. +Confused but this normal but is worse states her husband.  Reviewed Health History In EMR: Yes  Reviewed Medications In EMR: Yes  Reviewed Allergies In EMR: Yes  Reviewed Surgeries / Procedures: No  Date of Onset of Symptoms: 04/28/2012  Guideline(s) Used:  Colds  Disposition Per Guideline:   Go to ED Now (or to Office with PCP Approval)  Reason For Disposition Reached:   Patient sounds very sick or weak to the triager  Advice Given:  N/A  Appointment Scheduled:  05/03/2012 14:15:00 Appointment Scheduled Provider:  Oliver Barre (Adults only)  CONCERNED ABOUT PATIENT COMING TO THE OFFICE.  HUSBAND STATES SHE 'IS NOT GOING TO DIE!". WHEN TRYING TO TRIAGE FOR SAFETY IN OFFICE. . NO WHEEZING , GURGLING CONGESTION IN  CHEST. AFEBRILE. STATES SHE IS WEAK, NO ENERGY. MUST ASSIST HER.  HE STATES SHE WILL REFUSE ER AND MAYBE OFFICE.   PLEASE ADVISE . APPT SCHEDULED.

## 2012-05-10 ENCOUNTER — Telehealth: Payer: Self-pay | Admitting: Internal Medicine

## 2012-05-10 MED ORDER — OXYCODONE-ACETAMINOPHEN 10-650 MG PO TABS: 1.0000 | ORAL_TABLET | Freq: Four times a day (QID) | ORAL | Status: DC | PRN

## 2012-05-10 NOTE — Telephone Encounter (Signed)
Notified pt rx ready for pick-up.../lmb 

## 2012-05-10 NOTE — Telephone Encounter (Signed)
Patient needs a refill on Oxycodone, call when ready

## 2012-05-10 NOTE — Telephone Encounter (Signed)
Refill done - not to be filled before 2nd of each month

## 2012-05-15 ENCOUNTER — Ambulatory Visit: Payer: Medicare Other

## 2012-05-15 ENCOUNTER — Emergency Department (HOSPITAL_COMMUNITY): Payer: Medicare Other

## 2012-05-15 ENCOUNTER — Telehealth: Payer: Self-pay | Admitting: Internal Medicine

## 2012-05-15 ENCOUNTER — Ambulatory Visit (INDEPENDENT_AMBULATORY_CARE_PROVIDER_SITE_OTHER): Payer: Medicare Other | Admitting: Family Medicine

## 2012-05-15 ENCOUNTER — Emergency Department (HOSPITAL_COMMUNITY)
Admission: EM | Admit: 2012-05-15 | Discharge: 2012-05-15 | Disposition: A | Discharge status: Home / Self Care | Payer: Medicare Other | Attending: Emergency Medicine | Admitting: Emergency Medicine

## 2012-05-15 VITALS — BP 118/72 | HR 89 | Temp 97.6°F | Resp 18 | Ht 60.0 in | Wt 107.0 lb

## 2012-05-15 DIAGNOSIS — Z8781 Personal history of (healed) traumatic fracture: Secondary | ICD-10-CM | POA: Insufficient documentation

## 2012-05-15 DIAGNOSIS — Z794 Long term (current) use of insulin: Secondary | ICD-10-CM | POA: Insufficient documentation

## 2012-05-15 DIAGNOSIS — X58XXXA Exposure to other specified factors, initial encounter: Secondary | ICD-10-CM | POA: Insufficient documentation

## 2012-05-15 DIAGNOSIS — Z7901 Long term (current) use of anticoagulants: Secondary | ICD-10-CM | POA: Insufficient documentation

## 2012-05-15 DIAGNOSIS — Z9889 Other specified postprocedural states: Secondary | ICD-10-CM | POA: Insufficient documentation

## 2012-05-15 DIAGNOSIS — F1010 Alcohol abuse, uncomplicated: Secondary | ICD-10-CM | POA: Insufficient documentation

## 2012-05-15 DIAGNOSIS — Y929 Unspecified place or not applicable: Secondary | ICD-10-CM | POA: Insufficient documentation

## 2012-05-15 DIAGNOSIS — J4489 Other specified chronic obstructive pulmonary disease: Secondary | ICD-10-CM | POA: Insufficient documentation

## 2012-05-15 DIAGNOSIS — S32609A Unspecified fracture of unspecified ischium, initial encounter for closed fracture: Secondary | ICD-10-CM

## 2012-05-15 DIAGNOSIS — E785 Hyperlipidemia, unspecified: Secondary | ICD-10-CM | POA: Insufficient documentation

## 2012-05-15 DIAGNOSIS — T148XXA Other injury of unspecified body region, initial encounter: Secondary | ICD-10-CM

## 2012-05-15 DIAGNOSIS — M5126 Other intervertebral disc displacement, lumbar region: Secondary | ICD-10-CM | POA: Insufficient documentation

## 2012-05-15 DIAGNOSIS — N9489 Other specified conditions associated with female genital organs and menstrual cycle: Secondary | ICD-10-CM

## 2012-05-15 DIAGNOSIS — F17200 Nicotine dependence, unspecified, uncomplicated: Secondary | ICD-10-CM | POA: Insufficient documentation

## 2012-05-15 DIAGNOSIS — S3790XA Unspecified injury of unspecified urinary and pelvic organ, initial encounter: Secondary | ICD-10-CM | POA: Insufficient documentation

## 2012-05-15 DIAGNOSIS — R3589 Other polyuria: Secondary | ICD-10-CM

## 2012-05-15 DIAGNOSIS — Z8719 Personal history of other diseases of the digestive system: Secondary | ICD-10-CM | POA: Insufficient documentation

## 2012-05-15 DIAGNOSIS — Z8679 Personal history of other diseases of the circulatory system: Secondary | ICD-10-CM | POA: Insufficient documentation

## 2012-05-15 DIAGNOSIS — Z79899 Other long term (current) drug therapy: Secondary | ICD-10-CM | POA: Insufficient documentation

## 2012-05-15 DIAGNOSIS — I10 Essential (primary) hypertension: Secondary | ICD-10-CM | POA: Insufficient documentation

## 2012-05-15 DIAGNOSIS — Y9389 Activity, other specified: Secondary | ICD-10-CM | POA: Insufficient documentation

## 2012-05-15 DIAGNOSIS — E119 Type 2 diabetes mellitus without complications: Secondary | ICD-10-CM | POA: Insufficient documentation

## 2012-05-15 DIAGNOSIS — R300 Dysuria: Secondary | ICD-10-CM

## 2012-05-15 DIAGNOSIS — N949 Unspecified condition associated with female genital organs and menstrual cycle: Secondary | ICD-10-CM

## 2012-05-15 DIAGNOSIS — Z8669 Personal history of other diseases of the nervous system and sense organs: Secondary | ICD-10-CM | POA: Insufficient documentation

## 2012-05-15 DIAGNOSIS — I4891 Unspecified atrial fibrillation: Secondary | ICD-10-CM | POA: Insufficient documentation

## 2012-05-15 DIAGNOSIS — R358 Other polyuria: Secondary | ICD-10-CM

## 2012-05-15 DIAGNOSIS — M810 Age-related osteoporosis without current pathological fracture: Secondary | ICD-10-CM

## 2012-05-15 DIAGNOSIS — I252 Old myocardial infarction: Secondary | ICD-10-CM | POA: Insufficient documentation

## 2012-05-15 DIAGNOSIS — Z8659 Personal history of other mental and behavioral disorders: Secondary | ICD-10-CM | POA: Insufficient documentation

## 2012-05-15 DIAGNOSIS — R791 Abnormal coagulation profile: Secondary | ICD-10-CM | POA: Insufficient documentation

## 2012-05-15 DIAGNOSIS — J449 Chronic obstructive pulmonary disease, unspecified: Secondary | ICD-10-CM | POA: Insufficient documentation

## 2012-05-15 LAB — BASIC METABOLIC PANEL
CO2: 26 mEq/L (ref 19–32)
Calcium: 8.9 mg/dL (ref 8.4–10.5)
GFR calc Af Amer: >90 mL/min (ref >90)
GFR calc non Af Amer: >90 mL/min (ref >90)
Sodium: 130 mEq/L — ABNORMAL LOW (ref 135–145)

## 2012-05-15 LAB — CBC
MCH: 34.9 pg — ABNORMAL HIGH (ref 26.0–34.0)
Platelets: 289 10*3/uL (ref 150–400)
RBC: 2.95 MIL/uL — ABNORMAL LOW (ref 3.87–5.11)
RDW: 12.4 % (ref 11.5–15.5)

## 2012-05-15 LAB — POCT CBC
Hemoglobin: 10.7 g/dL — AB (ref 12.2–16.2)
Lymph, poc: 2.1 (ref 0.6–3.4)
MCHC: 30.3 g/dL — AB (ref 31.8–35.4)
MPV: 9.5 fL (ref 0–99.8)
POC Granulocyte: 12.2 — AB (ref 2–6.9)
POC MID %: 5.7 %M (ref 0–12)
RBC: 3.28 M/uL — AB (ref 4.04–5.48)

## 2012-05-15 LAB — PROTIME-INR
INR: 3.87 — ABNORMAL HIGH (ref 0.00–1.49)
Prothrombin Time: 36.4 seconds — ABNORMAL HIGH (ref 11.6–15.2)
Prothrombin Time: 35.7 seconds — ABNORMAL HIGH (ref 11.6–15.2)

## 2012-05-15 MED ORDER — IOHEXOL 300 MG/ML  SOLN
80.0000 mL | Freq: Once | INTRAMUSCULAR | Status: AC | PRN
  Administered 2012-05-15: 80 mL via INTRAVENOUS

## 2012-05-15 NOTE — ED Notes (Signed)
Bed:WA23<BR> Expected date:<BR> Expected time:<BR> Means of arrival:<BR> Comments:<BR> ems

## 2012-05-15 NOTE — Patient Instructions (Addendum)
Will need to go to the emergency room for evaluation of the pelvic fracture and hematoma.  I believe that you will need a pelvic CT scan to determine what is going on, as well as a PT/INR to check on your blood thinner.

## 2012-05-15 NOTE — Telephone Encounter (Signed)
Attempted to call pt back. Left vm/T.Nolen Mu, RN

## 2012-05-15 NOTE — ED Notes (Signed)
Patient transported to CT 

## 2012-05-15 NOTE — ED Notes (Signed)
Pt went to [facility] and was evaluated for pain and bruising in pelvic and buttocks region. Had Xray done and may have hairline fx of pelvis. Pt is able to stand and ambulate. Sent here for possible CT and eval of PT INR as she is on coumadin. Denies Trauma.

## 2012-05-15 NOTE — ED Notes (Signed)
FAMILY REQUESTED TO BE CALLED UPON DISCHARGE OR ADMISSION.  JOE Armentor: [phone]

## 2012-05-15 NOTE — ED Provider Notes (Signed)
History     CSN: [id number]  Arrival date & time 05/15/12  1510   First MD Initiated Contact with Patient 05/15/12 1520      Chief Complaint  Patient presents with  . Pelvic Fracture     HPI Patient reports increasing pain in her right labia and) the incidence yesterday and was noted to have a hematoma.  She was taken to the urgent care where an x-ray over pelvis was performed which demonstrated no evidence of fracture.  She was sent to the emergency department for further evaluation.  She is on Coumadin for atrial fibrillation and she does not know her current INR.  Several weeks ago she was for but she states this was corrected.  She has no easy bleeding anywhere else.  She denies recent trauma.  She has no abdominal pain.  No fevers or chills.  No other complaints.  Her symptoms are mild in severity.  The majority of her pain is when she sits down. Past Medical History  Diagnosis Date  . HTN (hypertension)   . Hyperlipidemia   . Diabetes mellitus, type 2   . Paroxysmal atrial fibrillation     chronic anticoag - follows [facility]  . Right bundle branch block and incomplete left bundle branch block     with left axis deviation  . Chronic back pain     narcotic dependence  . Lumbar disc disease     dr. Dutch Quint  . Mitral valve prolapse   . Macular degeneration   . Gastritis     RUT negative, 2005  . Diverticulosis   . Alcohol abuse   . Compression fracture 02/2011    s/p KP  . Arthritis   . Depression   . Myocardial infarction   . COPD (chronic obstructive pulmonary disease)     Past Surgical History  Procedure Date  . Right ankle surgery   . Knee arthroscopy     Family History  Problem Relation Age of Onset  . Heart disease Mother   . Cancer Father     throat  . Atrial fibrillation Sister     History  Substance Use Topics  . Smoking status: Current Every Day Smoker -- 0.5 packs/day for 65 years    Types: Cigarettes  . Smokeless tobacco: Never Used  . Alcohol  Use: Yes     Comment: Occasional    OB History    Grav Para Term Preterm Abortions TAB SAB Ect Mult Living                  Review of Systems  All other systems reviewed and are negative.    Allergies  Codeine and Morphine and related  Home Medications   Current Outpatient Rx  Name  Route  Sig  Dispense  Refill  . B COMPLEX PO TABS   Oral   Take 1 tablet by mouth daily.         Marland Kitchen BENZONATATE 100 MG PO CAPS      1-2 tabs by mouth three times per day as needed for cough   60 capsule   0   . VITAMIN D 1000 UNITS PO CAPS   Oral   Take 1,000 Units by mouth daily.           Marland Kitchen DIAZEPAM 5 MG PO TABS      TAKE 1 TABLET FOUR TIMES A DAY AS NEEDED FOR STRESS   120 tablet   4   . FENTANYL  50 MCG/HR TD PT72   Transdermal   Place 1 patch (50 mcg total) onto the skin every 3 (three) days.   10 patch   0     DO NOT FILL BEFORE 2nd day of each month   . GLUCOSE BLOOD VI STRP   Other   1 each by Other route as needed. Use as instructed          . METFORMIN HCL 500 MG PO TABS      TAKE 1 TABLET TWICE DAILY   60 tablet   5   . METOPROLOL TARTRATE 25 MG PO TABS      Take 1/2 tab by mouth twice daily   30 tablet   6   . NITROFURANTOIN MONOHYD MACRO 100 MG PO CAPS   Oral   Take 1 capsule (100 mg total) by mouth 2 (two) times daily.   14 capsule   0   . OXYCODONE-ACETAMINOPHEN 10-650 MG PO TABS   Oral   Take 1 tablet by mouth every 6 (six) hours as needed.   120 tablet   0     DO NOT FILL BEFORE 2nd day of each month   . PAROXETINE HCL 40 MG PO TABS      TAKE 1 TABLET EVERY DAY   90 tablet   1   . PHENAZOPYRIDINE HCL 200 MG PO TABS   Oral   Take 1 tablet (200 mg total) by mouth 3 (three) times daily as needed for pain (bladder).   15 tablet   0   . PRAVASTATIN SODIUM 80 MG PO TABS      TAKE ONCE A DAY   30 tablet   9     CYCLE FILL MEDICATION. Authorization is required f ...   . PSYLLIUM 58.6 % PO POWD   Oral   Take 1 packet by mouth  daily.           Marland Kitchen RAMIPRIL 5 MG PO CAPS      TAKE ONE CAPSULE BY MOUTH DAILY   30 capsule   2     CYCLE FILL MEDICATION. Authorization is required f ...   . SOLIFENACIN SUCCINATE 5 MG PO TABS   Oral   Take 1 tablet (5 mg total) by mouth daily.   30 tablet   3   . TEMAZEPAM 30 MG PO CAPS   Oral   Take 1 capsule (30 mg total) by mouth at bedtime as needed.   30 capsule   5     On schedule   . VERAPAMIL HCL ER 180 MG PO TBCR      TAKE 1 TABLET BY MOUTH TWICE A DAY   180 tablet   3   . WARFARIN SODIUM 5 MG PO TABS   Oral   Take 2.5-5 mg by mouth daily. Takes 1/2 tablet on sun,tues,thur. And takes 1 tablet on mon,wed,fri and sat           BP 111/69  Pulse 70  Resp 16  SpO2 97%  Physical Exam  Nursing note and vitals reviewed. Constitutional: She is oriented to person, place, and time. She appears well-developed and well-nourished. No distress.  HENT:  Head: Normocephalic and atraumatic.  Eyes: EOM are normal.  Neck: Normal range of motion.  Cardiovascular: Normal rate, regular rhythm and normal heart sounds.   Pulmonary/Chest: Effort normal and breath sounds normal.  Abdominal: Soft. She exhibits no distension. There is no tenderness.  Musculoskeletal: Normal range of  motion.       Hematoma of right labia as well as inferior right gluteal cleft.  There is no spreading erythema or fluctuance.  This appears to be a hardened hematoma.  Neurological: She is alert and oriented to person, place, and time.  Skin: Skin is warm and dry.  Psychiatric: She has a normal mood and affect. Judgment normal.    ED Course  Procedures (including critical care time)  Labs Reviewed  CBC - Abnormal; Notable for the following:    WBC 14.6 (*)     RBC 2.95 (*)     Hemoglobin 10.3 (*)     HCT 29.8 (*)     MCV 101.0 (*)     MCH 34.9 (*)     All other components within normal limits  BASIC METABOLIC PANEL - Abnormal; Notable for the following:    Sodium 130 (*)      Chloride 94 (*)     Glucose, Bld 140 (*)     Creatinine, Ser 0.40 (*)     All other components within normal limits  PROTIME-INR - Abnormal; Notable for the following:    Prothrombin Time 35.7 (*)     INR 3.87 (*)     All other components within normal limits   Dg Pelvis 1-2 Views  05/15/2012  *RADIOLOGY REPORT*  Clinical Data: Large left pelvic hematoma and ecchymosis.  PELVIS - 1-2 VIEW  Comparison: None.  Findings: The bones appear osteopenic.   There is a bony prominence extending inferiorly from the left superior pubic ramus.  This is likely exuberant bony formation related to remote pelvic fracture. No definite acute pelvic fracture is identified.  There is no evidence of diastasis.  There are moderate degenerative changes of the visualized lower lumbar spine.  Prominence of the soft tissues of the midline pelvis likely reflect the patient's reported swelling and bruising.  IMPRESSION: No definite acute bony abnormality identified.  If there is persistent clinical concern for a pelvic or proximal femur fracture, consider CT or MRI for further evaluation.  Probable remote healed fracture of the left pubic ramus (rami).   Original Report Authenticated By: Britta Mccreedy, M.D.    Ct Abdomen Pelvis W Contrast  05/15/2012  *RADIOLOGY REPORT*  Clinical Data:   Right gluteal and labial hematoma.  Pelvic pain.  CT ABDOMEN AND PELVIS WITH CONTRAST  Technique:  Multidetector CT imaging of the abdomen and pelvis was performed following the standard protocol during bolus administration of intravenous contrast.  Contrast: 80mL OMNIPAQUE IOHEXOL 300 MG/ML  SOLN  Comparison: 08/25/2010  Findings: No focal abnormalities seen in the liver or spleen.  The stomach, duodenum, pancreas, and gallbladder are unremarkable.  1.9 cm bilobed left adrenal nodule is assist with a smaller right adrenal nodule.  These were both assessed by MRI on 07/24/2009 and felt to be consistent with benign adenomas.  The kidneys are  unremarkable bilaterally.  Dense calcification noted in the wall of the abdominal aorta without aneurysm.  No free fluid or lymphadenopathy in the abdomen.  Imaging through the pelvis shows no free intraperitoneal fluid. There is no pelvic sidewall lymphadenopathy.  Apparent fibroid change noted in the uterus.  The bladder is moderately distended.  Imaging through the pelvis shows an apparent hematoma in the right ratio anal fat.  Imaging features suggest that there is ongoing extravasation to this hematoma (see images 2, 3, and 4 of series 3  Bone windows show an old fracture of the left superior and  inferior pubic rami.  No evidence for a right-sided pelvic fracture on today's study to account for the active bleeding.  In fact, there is relatively good preservation of fat planes between the bony anatomy of the pelvis and the hematoma.  IMPRESSION: There is apparent hematoma in the right ischioanal fat which generates mass effect on the anus and rectum as well as the vaginal introitus.  There is high attenuation material in the central aspect of this apparent hematoma consistent with continued extravasation.  There is no overlying acute pelvic fracture to account for this finding.  I personally discussed these findings by telephone with Dr. Patria Mane at to 1802 hours on 05/15/2012.   Original Report Authenticated By: Kennith Center, M.D.    I personally reviewed the imaging tests through PACS system I reviewed available ER/hospitalization records through the EMR   1. Hematoma   2. Supratherapeutic INR       MDM  Patient appears to have a hematoma without associated pelvic fracture.  There is a very small amount which appears though there may be some degree of active bleeding but this will likely attempt a knot itself off.  Her vital signs are normal.  She is on Coumadin and is supratherapeutic today with an INR of 4.  I will asked her to hold her next 2 doses and followup with her Coumadin physicians.  The  patient understands return the emergency department for new or worsening symptoms        Lyanne Co, MD 05/15/12 [phone]

## 2012-05-15 NOTE — Progress Notes (Signed)
Subjective: Patient is here complaining of pain in her left labia. She also has had some dysuria. She's had a recent urinary tract infections. She is on Coumadin and had her INR checked just a few weeks ago, and the INR was about 2.6 they think. Knows of no trauma. She has a lot of osteoporosis. She is hard time getting around because of her bad back. Denies sexual activity or sexual abuse as.  Objective: Abdomen soft and nontender. No CVA tenderness. Left labia looks normal. The right labia and right medial upper thigh had large bruises. Posteriorly she can be seen to have a large bruise to the right of the anus. All the way down it is very swollen. There is a soft tissue hematoma palpable the size of my fist.   [facility] reading (PRIMARY) by  Dr. Alwyn Ren Probable fx right pelvis ischium ramus.  Old deformity left pelvis with winging of inferior pubic ramus.  Hips look deformed but probably okay. Get stat reading.  Results for orders placed in visit on 05/15/12  POCT CBC      Component Value Range   WBC 15.2 (*) 4.6 - 10.2 K/uL   Lymph, poc 2.1  0.6 - 3.4   POC LYMPH PERCENT 14.1  10 - 50 %L   MID (cbc) 0.9  0 - 0.9   POC MID % 5.7  0 - 12 %M   POC Granulocyte 12.2 (*) 2 - 6.9   Granulocyte percent 80.2 (*) 37 - 80 %G   RBC 3.28 (*) 4.04 - 5.48 M/uL   Hemoglobin 10.7 (*) 12.2 - 16.2 g/dL   HCT, POC 65.7 (*) 84.6 - 47.9 %   MCV 107.7 (*) 80 - 97 fL   MCH, POC 32.6 (*) 27 - 31.2 pg   MCHC 30.3 (*) 31.8 - 35.4 g/dL   RDW, POC 96.2     Platelet Count, POC 328  142 - 424 K/uL   MPV 9.5  0 - 99.8 fL     Assessment: Fractured pelvis Hematoma pelvis Dysuria, unable to get urinalysis  Plan: Because of the hematoma think she is be sent to the hospital to be evaluated.   .  Radiologist reading is not sure that she has a pelvic fracture. Dr. Cleta Alberts and I feel like we see an angle of a fracture. Over toward the hip there is a little fracture visible.  Results for orders placed in visit on  05/15/12  POCT CBC      Component Value Range   WBC 15.2 (*) 4.6 - 10.2 K/uL   Lymph, poc 2.1  0.6 - 3.4   POC LYMPH PERCENT 14.1  10 - 50 %L   MID (cbc) 0.9  0 - 0.9   POC MID % 5.7  0 - 12 %M   POC Granulocyte 12.2 (*) 2 - 6.9   Granulocyte percent 80.2 (*) 37 - 80 %G   RBC 3.28 (*) 4.04 - 5.48 M/uL   Hemoglobin 10.7 (*) 12.2 - 16.2 g/dL   HCT, POC 95.2 (*) 84.1 - 47.9 %   MCV 107.7 (*) 80 - 97 fL   MCH, POC 32.6 (*) 27 - 31.2 pg   MCHC 30.3 (*) 31.8 - 35.4 g/dL   RDW, POC 32.4     Platelet Count, POC 328  142 - 424 K/uL   MPV 9.5  0 - 99.8 fL  PROTIME-INR      Component Value Range   Prothrombin Time 36.4 (*) 11.6 -  15.2 seconds   INR 3.93 (*) <1.50

## 2012-05-16 ENCOUNTER — Telehealth: Payer: Self-pay

## 2012-05-16 NOTE — Telephone Encounter (Signed)
Pt was seen yesterday and sent to hospital. Pt stated needs meds for pain.  Please call pt back to let her know when done. [phone] [facility] on [address]

## 2012-05-16 NOTE — Telephone Encounter (Signed)
Looks like she already has Percocet on hand, requests pain meds for pelvic fx. Please advise.

## 2012-05-17 ENCOUNTER — Ambulatory Visit: Payer: Self-pay | Admitting: Internal Medicine

## 2012-05-17 ENCOUNTER — Telehealth: Payer: Self-pay

## 2012-05-17 DIAGNOSIS — R102 Pelvic and perineal pain: Secondary | ICD-10-CM

## 2012-05-17 NOTE — Telephone Encounter (Signed)
I spoke to patient, Dr Felicity Coyer should continue giving her the pain meds, and if changes need to be made, Dr Felicity Coyer should do this. I have advised her to continue her pain meds as Dr Felicity Coyer has given them, not to change them. She states she has appt with Dr Felicity Coyer tomorrow at 2 pm. I have also advised her Dr Alwyn Ren will refer for Ortho for her new injury. He is very concerned she has had this injury and does not recall what happened. She asks repeatedly about why she was not given pain meds from our office. I advised her that her chart indicates she has plenty of pain meds available, and if we add to this she may stop breathing. She voiced understanding of this and will continue what she has on hand. I asked her if she has seen an orthopedist in the past, and she does not recall one. Referral will be made to Ortho, whichever office has availability first.

## 2012-05-17 NOTE — Telephone Encounter (Signed)
This message came in on the referral voicemail, patient saw dr hopper and said she desperately needs to talk to him about why he did not give her any meds or a referral please call patient at [phone]

## 2012-05-17 NOTE — Telephone Encounter (Signed)
Have spoken to Dr Alwyn Ren and patient should have plenty of pain meds, changes should come form Dr Bayard Hugger, patient is to see her tomorrow. See other phone message.

## 2012-05-18 ENCOUNTER — Ambulatory Visit: Payer: Medicare Other | Admitting: Internal Medicine

## 2012-05-19 ENCOUNTER — Other Ambulatory Visit: Payer: Self-pay | Admitting: Internal Medicine

## 2012-05-23 ENCOUNTER — Encounter: Payer: Self-pay | Admitting: Internal Medicine

## 2012-05-23 ENCOUNTER — Other Ambulatory Visit (INDEPENDENT_AMBULATORY_CARE_PROVIDER_SITE_OTHER): Payer: Medicare Other

## 2012-05-23 ENCOUNTER — Ambulatory Visit (INDEPENDENT_AMBULATORY_CARE_PROVIDER_SITE_OTHER): Payer: Medicare Other | Admitting: General Practice

## 2012-05-23 ENCOUNTER — Ambulatory Visit (INDEPENDENT_AMBULATORY_CARE_PROVIDER_SITE_OTHER): Payer: Medicare Other | Admitting: Internal Medicine

## 2012-05-23 VITALS — BP 120/60 | HR 75 | Temp 98.0°F | Ht 60.0 in | Wt 110.8 lb

## 2012-05-23 DIAGNOSIS — N3281 Overactive bladder: Secondary | ICD-10-CM

## 2012-05-23 DIAGNOSIS — IMO0001 Reserved for inherently not codable concepts without codable children: Secondary | ICD-10-CM

## 2012-05-23 DIAGNOSIS — I4891 Unspecified atrial fibrillation: Secondary | ICD-10-CM

## 2012-05-23 DIAGNOSIS — Z7901 Long term (current) use of anticoagulants: Secondary | ICD-10-CM

## 2012-05-23 DIAGNOSIS — N318 Other neuromuscular dysfunction of bladder: Secondary | ICD-10-CM

## 2012-05-23 DIAGNOSIS — Z23 Encounter for immunization: Secondary | ICD-10-CM

## 2012-05-23 DIAGNOSIS — M545 Low back pain, unspecified: Secondary | ICD-10-CM

## 2012-05-23 LAB — HEMOGLOBIN A1C: Hgb A1c MFr Bld: 5.9 % (ref 4.6–6.5)

## 2012-05-23 LAB — POCT INR: INR: 2.7

## 2012-05-23 MED ORDER — FENTANYL 50 MCG/HR TD PT72: 1.0000 | MEDICATED_PATCH | TRANSDERMAL | Status: DC

## 2012-05-23 MED ORDER — PAROXETINE HCL 40 MG PO TABS: 40.0000 mg | ORAL_TABLET | ORAL | Status: DC

## 2012-05-23 MED ORDER — SOLIFENACIN SUCCINATE 10 MG PO TABS: 10.0000 mg | ORAL_TABLET | Freq: Every day | ORAL | Status: DC

## 2012-05-23 NOTE — Assessment & Plan Note (Signed)
Chronic problems - narcotics maintained for same So long as no escalating dose or change in pain, i will continue to fill medications as ongoing hx vert compression fx and VP 08/2010 -  She declines referal to pain clinic: " i have tried those clinics before and they don't work" 3 rx for Duragesic provided today to fill each month - given to spouse who manages pt meds

## 2012-05-23 NOTE — Assessment & Plan Note (Signed)
On metformin solo - stopped amaryl for dizziness and hypoglycemia symptoms in 02/2012 Reports home cbgs 90-120 fasting since stopping OHA 02/2012  Lab Results  Component Value Date   HGBA1C 5.2 02/20/2012

## 2012-05-23 NOTE — Progress Notes (Signed)
  Subjective:    Patient ID: [name], female    DOB: 02/02/1933, 79 y.o.   MRN: [id number]  HPI here for ER follow up - pain from hematoma without known trauma 12/31, no fracture  Also reviewed chronic medical issues:  OAB - on vesicare since 03/2012, initially improved but now recurrent symptoms -?increase dose  Also requests pain med refills  diabetes mellitus - infrequently check cbgs - stopped amaryl 03/2012 due to symptomatic hypoglycemia - fasting cbgs 114-140  Past Medical History  Diagnosis Date  . HTN (hypertension)   . Hyperlipidemia   . Diabetes mellitus, type 2   . Paroxysmal atrial fibrillation     chronic anticoag - follows [facility]  . Right bundle branch block and incomplete left bundle branch block     with left axis deviation  . Chronic back pain     narcotic dependence  . Lumbar disc disease     dr. Dutch Quint  . Mitral valve prolapse   . Macular degeneration   . Gastritis     RUT negative, 2005  . Diverticulosis   . Alcohol abuse   . Compression fracture 02/2011    s/p KP  . Arthritis   . Depression   . Myocardial infarction   . COPD (chronic obstructive pulmonary disease)      Review of Systems  Constitutional: Negative for fever, activity change and appetite change.  Respiratory: Negative for cough, chest tightness, shortness of breath and wheezing.   Cardiovascular: Negative for chest pain and palpitations.  Genitourinary: Positive for urgency and frequency. Negative for dysuria, hematuria, flank pain and pelvic pain.  Musculoskeletal: Positive for myalgias and back pain.  Neurological: Negative for dizziness and headaches.       Objective:   Physical Exam  BP 120/60  Pulse 75  Temp 98 F (36.7 C) (Oral)  Ht 5' (1.524 m)  Wt 110 lb 12.8 oz (50.259 kg)  BMI 21.64 kg/m2  SpO2 93% Gen: NAD - tremulous and kyphotic without change - spouse at side Lungs: CTA B CV: RRR, no edema Neuro: AAOx3, no CN deficits - MAE and gait/balance  unchanged Psychiatric - rambling and tangential - oriented to person, place but poor short term recall (0/3 at 5 min)  Lab Results  Component Value Date   WBC 14.6* 05/15/2012   HGB 10.3* 05/15/2012   HCT 29.8* 05/15/2012   PLT 289 05/15/2012   GLUCOSE 140* 05/15/2012   CHOL 127 11/16/2011   TRIG 143.0 11/16/2011   HDL 48.50 11/16/2011   LDLCALC 50 11/16/2011   ALT 20 11/16/2011   AST 30 11/16/2011   NA 130* 05/15/2012   K 4.1 05/15/2012   CL 94* 05/15/2012   CREATININE 0.40* 05/15/2012   BUN 10 05/15/2012   CO2 26 05/15/2012   TSH 0.74 11/16/2011   INR 2.7 05/23/2012   HGBA1C 5.9 05/23/2012   MICROALBUR 0.8 10/01/2009       Assessment & Plan:  See problem list. Medications and labs reviewed today.

## 2012-05-23 NOTE — Assessment & Plan Note (Signed)
Chronic anticoag - follows with cards and [facility] for same The current medical regimen is effective;  continue present plan and medications.  Lab Results  Component Value Date   INR 2.7 05/23/2012   INR 3.87* 05/15/2012   INR 3.93* 05/15/2012   PROTIME 19.5 10/10/2008

## 2012-05-23 NOTE — Assessment & Plan Note (Signed)
Started Vesicare for bladder irritability 03/2012 Increase dose now - erx done

## 2012-05-23 NOTE — Patient Instructions (Addendum)
It was good to see you today. We have reviewed your prior records including labs and tests today Medications reviewed and updated - refills done Test(s) ordered today. Your results will be released to MyChart (or called to you) after review, usually within 72hours after test completion. If any changes need to be made, you will be notified at that same time. Tetanus shot done today Please schedule followup in 4 months, call sooner if problems.

## 2012-05-31 ENCOUNTER — Telehealth: Payer: Self-pay

## 2012-05-31 NOTE — Telephone Encounter (Signed)
Called patient, she can come back to clinic if she is concerned about this area. Certainly sounds painful. She states she can not come tonight. She is asking if she should come here or to see Dr Felicity Coyer, I advised it is up to her where she prefers to be seen.

## 2012-05-31 NOTE — Telephone Encounter (Signed)
Pt called and states right at her pubic area, it is hard as a rock and purple. Wants to discuss with someone tonight

## 2012-06-01 ENCOUNTER — Telehealth: Payer: Self-pay

## 2012-06-01 NOTE — Telephone Encounter (Signed)
Patient states to disregard this message.

## 2012-06-01 NOTE — Telephone Encounter (Signed)
Pt would like a nurse to call her regarding her condition please call [phone]

## 2012-06-06 ENCOUNTER — Ambulatory Visit: Payer: Medicare Other | Admitting: Internal Medicine

## 2012-06-08 ENCOUNTER — Encounter: Payer: Self-pay | Admitting: Internal Medicine

## 2012-06-08 ENCOUNTER — Ambulatory Visit (INDEPENDENT_AMBULATORY_CARE_PROVIDER_SITE_OTHER): Payer: Medicare Other | Admitting: General Practice

## 2012-06-08 ENCOUNTER — Ambulatory Visit (INDEPENDENT_AMBULATORY_CARE_PROVIDER_SITE_OTHER): Payer: Medicare Other | Admitting: Internal Medicine

## 2012-06-08 VITALS — BP 110/68 | HR 74 | Temp 98.1°F | Wt 108.8 lb

## 2012-06-08 DIAGNOSIS — B370 Candidal stomatitis: Secondary | ICD-10-CM

## 2012-06-08 DIAGNOSIS — I4891 Unspecified atrial fibrillation: Secondary | ICD-10-CM

## 2012-06-08 DIAGNOSIS — Z7901 Long term (current) use of anticoagulants: Secondary | ICD-10-CM

## 2012-06-08 DIAGNOSIS — N318 Other neuromuscular dysfunction of bladder: Secondary | ICD-10-CM

## 2012-06-08 DIAGNOSIS — IMO0001 Reserved for inherently not codable concepts without codable children: Secondary | ICD-10-CM

## 2012-06-08 DIAGNOSIS — N3281 Overactive bladder: Secondary | ICD-10-CM

## 2012-06-08 DIAGNOSIS — T148XXA Other injury of unspecified body region, initial encounter: Secondary | ICD-10-CM

## 2012-06-08 MED ORDER — TOLTERODINE TARTRATE ER 4 MG PO CP24: 4.0000 mg | ORAL_CAPSULE | Freq: Every day | ORAL | Status: DC

## 2012-06-08 MED ORDER — NYSTATIN 100000 UNIT/ML MT SUSP: 500000.0000 [IU] | Freq: Four times a day (QID) | OROMUCOSAL | Status: DC

## 2012-06-08 NOTE — Progress Notes (Signed)
  Subjective:    Patient ID: [name], female    DOB: 23-Jul-1932, 79 y.o.   MRN: [id number]  HPI here for follow up - continued pain from hematoma over pelvis without known trauma 12/31, no fracture Also complains of tongue pain, ?thrush  Also reviewed chronic medical issues:  OAB - on vesicare since 03/2012, initially improved but now recurrent symptoms -increased dose 3 weeks ago without improvement -?change med  diabetes mellitus - infrequently check cbgs - stopped amaryl 03/2012 due to symptomatic hypoglycemia: dizzy - fasting cbgs 114-140  Past Medical History  Diagnosis Date  . HTN (hypertension)   . Hyperlipidemia   . Diabetes mellitus, type 2   . Paroxysmal atrial fibrillation     chronic anticoag - follows [facility]  . Right bundle branch block and incomplete left bundle branch block     with left axis deviation  . Chronic back pain     narcotic dependence  . Lumbar disc disease     dr. Dutch Quint  . Mitral valve prolapse   . Macular degeneration   . Gastritis     RUT negative, 2005  . Diverticulosis   . Alcohol abuse   . Compression fracture 02/2011    s/p KP  . Arthritis   . Depression   . Myocardial infarction   . COPD (chronic obstructive pulmonary disease)      Review of Systems  Constitutional: Negative for fever, activity change and appetite change.  Respiratory: Negative for cough, chest tightness, shortness of breath and wheezing.   Cardiovascular: Negative for chest pain and palpitations.  Genitourinary: Positive for urgency and frequency. Negative for dysuria, hematuria, flank pain and pelvic pain.  Musculoskeletal: Positive for myalgias and back pain.  Neurological: Negative for dizziness and headaches.       Objective:   Physical Exam  BP 110/68  Pulse 74  Temp 98.1 F (36.7 C) (Oral)  Wt 108 lb 12.8 oz (49.351 kg)  SpO2 93% Gen: NAD - tremulous and kyphotic without change -  HENT: tongue with mod thrush, mild swelling, no  angioedema Lungs: CTA B CV: RRR, no edema Abd: SNTND, +BS Skin - hematoma improving Neuro: AAOx3, no CN deficits - MAE and gait/balance unchanged Psychiatric - rambling and tangential - oriented to person, place but poor short term recall (0/3 at 5 min)  Lab Results  Component Value Date   WBC 14.6* 05/15/2012   HGB 10.3* 05/15/2012   HCT 29.8* 05/15/2012   PLT 289 05/15/2012   GLUCOSE 140* 05/15/2012   CHOL 127 11/16/2011   TRIG 143.0 11/16/2011   HDL 48.50 11/16/2011   LDLCALC 50 11/16/2011   ALT 20 11/16/2011   AST 30 11/16/2011   NA 130* 05/15/2012   K 4.1 05/15/2012   CL 94* 05/15/2012   CREATININE 0.40* 05/15/2012   BUN 10 05/15/2012   CO2 26 05/15/2012   TSH 0.74 11/16/2011   INR 3.1 06/08/2012   HGBA1C 5.9 05/23/2012   MICROALBUR 0.8 10/01/2009   Lab Results  Component Value Date   VITAMINB12 663 11/16/2011      Assessment & Plan:  See problem list. Medications and labs reviewed today.  Oral thrush - MM rx'd  Hematoma - welling is improving - reassurance provided - continue following with [facility] as ongoing

## 2012-06-08 NOTE — Patient Instructions (Signed)
It was good to see you today. We have reviewed your prior records including labs and tests today Medications reviewed and updated - Change vesicare to Detrol LA for bladder - Nystatin mouthwash for treatment of your tongue pain symptoms  Your prescription(s) have been submitted to your pharmacy. Please take as directed and contact our office if you believe you are having problem(s) with the medication(s). Continue same metformin dose because of good a1c control of diabetes mellitus -  Lab Results  Component Value Date   HGBA1C 5.9 05/23/2012   Please schedule followup in 4 months, call sooner if problems.

## 2012-06-08 NOTE — Assessment & Plan Note (Signed)
On metformin solo, dose increase early 05/2012 previously stopped amaryl for dizziness and hypoglycemia symptoms in 02/2012 Spouse reports home cbgs 90-120 fasting since stopping OHA 02/2012, reassurance provided  Lab Results  Component Value Date   HGBA1C 5.9 05/23/2012

## 2012-06-08 NOTE — Assessment & Plan Note (Signed)
Started Vesicare for bladder irritability 03/2012 Increase dose 05/2012 without improved symptoms Change vesicare to alternate bladder agent -  erx done

## 2012-06-14 ENCOUNTER — Telehealth: Payer: Self-pay | Admitting: Internal Medicine

## 2012-06-14 NOTE — Telephone Encounter (Signed)
Pt called back to say she has the prescriptions.  They were given at the last OV.  She has found them.

## 2012-06-14 NOTE — Telephone Encounter (Signed)
The patient called hoping to get a refill on her pain medication

## 2012-06-15 MED ORDER — OXYCODONE-ACETAMINOPHEN 10-650 MG PO TABS: 1.0000 | ORAL_TABLET | Freq: Four times a day (QID) | ORAL | Status: DC | PRN

## 2012-06-15 NOTE — Telephone Encounter (Signed)
Ok rx done

## 2012-06-15 NOTE — Addendum Note (Signed)
Addended by: Rene Paci A on: 06/15/2012 01:19 PM   Modules accepted: Orders

## 2012-06-15 NOTE — Telephone Encounter (Signed)
The patient called back stating the rx she found was actually the sentinel patches.  She states she still needs a written rx for oxycodone tablets.  She is hoping to pick that rx today so she will have enough tablets for the weekend.

## 2012-06-18 ENCOUNTER — Telehealth: Payer: Self-pay | Admitting: Internal Medicine

## 2012-06-18 NOTE — Telephone Encounter (Signed)
The patient called and stated when she attempted to refill her oxycodone rx, the pharmacist informed her they no longer were making that type of drug.  She is hoping the rx can be changed to "hydrocodone 650" which the pharmacist told her was the equivalent.  She still has the original pain med rx.  She is hoping this can be taken care of as soon as possible due to her ongoing pain issues.  Thanks!

## 2012-06-18 NOTE — Telephone Encounter (Signed)
Spoke with pharmacist, he states the rx is still available, he only has to re-order the med.  He is going to 30 tabs today, and have the pt come back for the 90 tabs as soon as they come in to the pharmacy.   I'll call the pt back and try my best to explain.   Thanks!

## 2012-06-18 NOTE — Telephone Encounter (Signed)
Please call patient pharmacy and clarify the medication confusion -specifically, what medication and dose is pharmacy recommending? Thanks

## 2012-06-26 ENCOUNTER — Other Ambulatory Visit: Payer: Self-pay | Admitting: *Deleted

## 2012-06-26 MED ORDER — METFORMIN HCL 500 MG PO TABS: ORAL_TABLET | ORAL | Status: DC

## 2012-07-06 ENCOUNTER — Ambulatory Visit: Payer: Medicare Other

## 2012-07-07 ENCOUNTER — Other Ambulatory Visit: Payer: Self-pay | Admitting: Internal Medicine

## 2012-07-08 ENCOUNTER — Other Ambulatory Visit: Payer: Self-pay | Admitting: Internal Medicine

## 2012-07-09 NOTE — Telephone Encounter (Signed)
[facility]...lmb

## 2012-07-10 ENCOUNTER — Ambulatory Visit: Payer: Medicare Other

## 2012-07-11 ENCOUNTER — Telehealth: Payer: Self-pay | Admitting: Internal Medicine

## 2012-07-11 MED ORDER — OXYCODONE-ACETAMINOPHEN 10-650 MG PO TABS: 1.0000 | ORAL_TABLET | Freq: Four times a day (QID) | ORAL | Status: DC | PRN

## 2012-07-11 NOTE — Telephone Encounter (Signed)
ok 

## 2012-07-11 NOTE — Telephone Encounter (Signed)
The pt is hoping to get a written script of oxycodone for pick up.  She states she has enough to last until March 2nd.    Callback - [phone]

## 2012-07-12 NOTE — Telephone Encounter (Signed)
Notified pt rx ready for pick-up.../lmb 

## 2012-07-13 ENCOUNTER — Ambulatory Visit: Payer: Medicare Other

## 2012-07-17 ENCOUNTER — Ambulatory Visit (INDEPENDENT_AMBULATORY_CARE_PROVIDER_SITE_OTHER): Payer: Medicare Other | Admitting: General Practice

## 2012-07-17 ENCOUNTER — Telehealth: Payer: Self-pay | Admitting: Internal Medicine

## 2012-07-17 DIAGNOSIS — Z7901 Long term (current) use of anticoagulants: Secondary | ICD-10-CM

## 2012-07-17 DIAGNOSIS — I4891 Unspecified atrial fibrillation: Secondary | ICD-10-CM

## 2012-07-17 LAB — POCT INR: INR: 2

## 2012-07-31 ENCOUNTER — Telehealth: Payer: Self-pay | Admitting: *Deleted

## 2012-07-31 MED ORDER — CIPROFLOXACIN HCL 250 MG PO TABS: 250.0000 mg | ORAL_TABLET | Freq: Two times a day (BID) | ORAL | Status: DC

## 2012-07-31 NOTE — Telephone Encounter (Signed)
Cipro 250 mg twice a day x3 days, erx done - should seek office visit evaluation if problems persist despite antibiotics

## 2012-07-31 NOTE — Telephone Encounter (Signed)
Pt is requesting rx for kidney infection-pt states that she is having pain when urinating and scanty urine for past couple of days and is now unable to go-she states that she doesn't have a ride and is unable to come into the office.

## 2012-07-31 NOTE — Telephone Encounter (Signed)
Pt informed of rx for Cipro and MD's advisement for evaluation. Rx sent to [facility] per pt's request.

## 2012-08-06 ENCOUNTER — Other Ambulatory Visit: Payer: Self-pay | Admitting: Internal Medicine

## 2012-08-13 ENCOUNTER — Telehealth: Payer: Self-pay | Admitting: *Deleted

## 2012-08-13 ENCOUNTER — Telehealth: Payer: Self-pay | Admitting: Internal Medicine

## 2012-08-13 MED ORDER — OXYCODONE-ACETAMINOPHEN 10-325 MG PO TABS: 1.0000 | ORAL_TABLET | Freq: Four times a day (QID) | ORAL | Status: DC | PRN

## 2012-08-13 MED ORDER — OXYCODONE-ACETAMINOPHEN 10-650 MG PO TABS: 1.0000 | ORAL_TABLET | Freq: Four times a day (QID) | ORAL | Status: DC | PRN

## 2012-08-13 NOTE — Telephone Encounter (Signed)
Noted - done - thanks

## 2012-08-13 NOTE — Telephone Encounter (Signed)
Brought rx for oxycodone 10/650 mg back . He stated pharmacist states strength is no longer available. Recommending 10/325...Raechel Chute

## 2012-08-13 NOTE — Telephone Encounter (Signed)
Notified pt rx ready for pick-up. Pt states since her husband in in the hosp will have family friend pick rx up [name]

## 2012-08-13 NOTE — Telephone Encounter (Signed)
ok 

## 2012-08-13 NOTE — Telephone Encounter (Signed)
Patient requesting refill on oxycodone, call when ready for pick up

## 2012-08-14 ENCOUNTER — Ambulatory Visit: Payer: Medicare Other | Admitting: Internal Medicine

## 2012-08-14 NOTE — Telephone Encounter (Signed)
Notified pt rx ready for pick-up,,,/lmb 

## 2012-08-28 ENCOUNTER — Ambulatory Visit (INDEPENDENT_AMBULATORY_CARE_PROVIDER_SITE_OTHER): Payer: Medicare Other | Admitting: General Practice

## 2012-08-28 DIAGNOSIS — Z7901 Long term (current) use of anticoagulants: Secondary | ICD-10-CM

## 2012-08-28 DIAGNOSIS — I4891 Unspecified atrial fibrillation: Secondary | ICD-10-CM

## 2012-08-28 LAB — POCT INR: INR: 2

## 2012-09-11 ENCOUNTER — Other Ambulatory Visit: Payer: Self-pay | Admitting: Internal Medicine

## 2012-09-12 ENCOUNTER — Other Ambulatory Visit: Payer: Self-pay

## 2012-09-12 ENCOUNTER — Telehealth: Payer: Self-pay | Admitting: *Deleted

## 2012-09-12 MED ORDER — OXYCODONE-ACETAMINOPHEN 10-325 MG PO TABS: 1.0000 | ORAL_TABLET | Freq: Four times a day (QID) | ORAL | Status: DC | PRN

## 2012-09-12 NOTE — Telephone Encounter (Signed)
Phone call from pt stating she will be out of her Oxycodone in 2 days and is needing a refill. Please advise.

## 2012-09-12 NOTE — Telephone Encounter (Signed)
I believe this refill was already authorized earlier this afternoon (4/30) - I signed rx and gave to [facility] -please check ... If i am mistaken, I will sign for refill

## 2012-09-12 NOTE — Telephone Encounter (Signed)
Notified pt rx ready for pick-up.../lmb 

## 2012-09-12 NOTE — Telephone Encounter (Signed)
Pt is requesting Oxycodone Rx. Please advise.

## 2012-09-12 NOTE — Telephone Encounter (Signed)
Ok thx.

## 2012-09-12 NOTE — Telephone Encounter (Signed)
[facility].../[facility]mb 

## 2012-09-13 ENCOUNTER — Other Ambulatory Visit: Payer: Self-pay | Admitting: Internal Medicine

## 2012-09-13 NOTE — Telephone Encounter (Signed)
This was already done by Orlan Leavens

## 2012-09-14 ENCOUNTER — Other Ambulatory Visit: Payer: Self-pay | Admitting: Cardiology

## 2012-09-25 NOTE — Telephone Encounter (Signed)
Error

## 2012-09-27 ENCOUNTER — Ambulatory Visit (INDEPENDENT_AMBULATORY_CARE_PROVIDER_SITE_OTHER): Payer: Medicare Other

## 2012-09-27 ENCOUNTER — Ambulatory Visit (INDEPENDENT_AMBULATORY_CARE_PROVIDER_SITE_OTHER): Payer: Medicare Other | Admitting: Internal Medicine

## 2012-09-27 ENCOUNTER — Encounter: Payer: Self-pay | Admitting: Internal Medicine

## 2012-09-27 VITALS — BP 96/57 | HR 72 | Wt 116.0 lb

## 2012-09-27 VITALS — BP 128/72 | HR 88 | Temp 98.1°F

## 2012-09-27 DIAGNOSIS — N318 Other neuromuscular dysfunction of bladder: Secondary | ICD-10-CM

## 2012-09-27 DIAGNOSIS — E1149 Type 2 diabetes mellitus with other diabetic neurological complication: Secondary | ICD-10-CM

## 2012-09-27 DIAGNOSIS — Z7901 Long term (current) use of anticoagulants: Secondary | ICD-10-CM

## 2012-09-27 DIAGNOSIS — N3281 Overactive bladder: Secondary | ICD-10-CM

## 2012-09-27 DIAGNOSIS — I4891 Unspecified atrial fibrillation: Secondary | ICD-10-CM

## 2012-09-27 DIAGNOSIS — M545 Low back pain: Secondary | ICD-10-CM

## 2012-09-27 DIAGNOSIS — R413 Other amnesia: Secondary | ICD-10-CM

## 2012-09-27 LAB — HM MAMMOGRAPHY

## 2012-09-27 MED ORDER — MEMANTINE HCL ER 14 MG PO CP24: 1.0000 | ORAL_CAPSULE | Freq: Every day | ORAL | Status: DC

## 2012-09-27 NOTE — Assessment & Plan Note (Signed)
Paroxysmal atrial fibrillation. On warfarin. she complained of being cold. Discussed the role of NOACs. We discussed risks and benefits of the issue of non-reversibility of bleeding. She will check it is variable costs and get back to [place]. Renal function last December was normal with a creatinine of 0.4 however, if we started on apixaban I would start her on low-dose given age and frailty

## 2012-09-27 NOTE — Assessment & Plan Note (Signed)
Started Vesicare for bladder irritability 03/2012 Increase dose 05/2012 without improved symptoms Changed vesicare to detrol la 05/2012 - improved The current medical regimen is effective;  continue present plan and medications.

## 2012-09-27 NOTE — Patient Instructions (Addendum)
Your physician wants you to follow-up in: 3 months with Tereso Newcomer, PA.    Your physician recommends that you continue on your current medications as directed. Please refer to the Current Medication list given to you today.

## 2012-09-27 NOTE — Patient Instructions (Signed)
It was good to see you today. We have reviewed your prior records including labs and tests today Medications reviewed and updated - Start Namenda XR for memory - Your prescription(s) have been submitted to your pharmacy. Please take as directed and contact our office if you believe you are having problem(s) with the medication(s). Test(s) ordered today. Your results will be released to MyChart (or called to you) after review, usually within 72hours after test completion. If any changes need to be made, you will be notified at that same time. Please schedule followup in 4 months, call sooner if problems.

## 2012-09-27 NOTE — Progress Notes (Signed)
  Subjective:    Patient ID: [name], female    DOB: 1933-04-03, 79 y.o.   MRN: [id number]  HPI  here for follow up - reviewed chronic medical issues:  OAB - started vesicar 03/2012, changed to Detrol LA 05/2012 -initially improved but now recurrent symptoms -  Also requests pain med refills  diabetes mellitus - infrequently check cbgs - stopped amaryl 03/2012 due to symptomatic hypoglycemia - fasting cbgs 114-140  hypertension - reviewed home BP log - variable - the patient reports compliance with medication(s) as prescribed. Denies adverse side effects.  Also progressive memory problems, ?med therapy  Past Medical History  Diagnosis Date  . HTN (hypertension)   . Hyperlipidemia   . Diabetes mellitus, type 2   . Paroxysmal atrial fibrillation     chronic anticoag - follows [facility]  . Right bundle branch block and incomplete left bundle branch block     with left axis deviation  . Chronic back pain     narcotic dependence  . Lumbar disc disease     dr. Dutch Quint  . Mitral valve prolapse   . Macular degeneration   . Gastritis     RUT negative, 2005  . Diverticulosis   . Alcohol abuse   . Compression fracture 02/2011    s/p KP  . Arthritis   . Depression   . Myocardial infarction   . COPD (chronic obstructive pulmonary disease)      Review of Systems  Constitutional: Negative for fever, activity change and appetite change.  Respiratory: Negative for cough, chest tightness, shortness of breath and wheezing.   Cardiovascular: Negative for chest pain and palpitations.  Gastrointestinal: Positive for constipation. Negative for abdominal pain and blood in stool.  Genitourinary: Positive for urgency and frequency. Negative for dysuria, hematuria, flank pain and pelvic pain.  Musculoskeletal: Positive for myalgias and back pain (chronic).  Neurological: Negative for dizziness and headaches.  Psychiatric/Behavioral: Positive for decreased concentration. The patient is  nervous/anxious (chronic).        Objective:   Physical Exam  BP 128/72  Pulse 88  Temp(Src) 98.1 F (36.7 C) (Oral)  SpO2 93% Gen: NAD - tremulous and kyphotic without change -  Lungs: CTA B CV: RRR, no edema Neuro: AAOx2, no CN deficits - MAE and gait/balance unchanged Psychiatric - rambling and tangential - oriented to person, place but poor short term recall (0/3 at 5 min)  Lab Results  Component Value Date   WBC 14.6* 05/15/2012   HGB 10.3* 05/15/2012   HCT 29.8* 05/15/2012   PLT 289 05/15/2012   GLUCOSE 140* 05/15/2012   CHOL 127 11/16/2011   TRIG 143.0 11/16/2011   HDL 48.50 11/16/2011   LDLCALC 50 11/16/2011   ALT 20 11/16/2011   AST 30 11/16/2011   NA 130* 05/15/2012   K 4.1 05/15/2012   CL 94* 05/15/2012   CREATININE 0.40* 05/15/2012   BUN 10 05/15/2012   CO2 26 05/15/2012   TSH 0.74 11/16/2011   INR 2.0 08/28/2012   HGBA1C 5.9 05/23/2012   MICROALBUR 0.8 10/01/2009       Assessment & Plan:   See problem list. Medications and labs reviewed today.

## 2012-09-27 NOTE — Assessment & Plan Note (Signed)
Progressive decline - complicated by chronic high dose narcotics/BZs Pt agreeable to med trial - namenda rx

## 2012-09-27 NOTE — Assessment & Plan Note (Signed)
Chronic problems - narcotics maintained for same So long as no escalating dose or change in pain, i will continue to fill medications as ongoing hx vert compression fx and VP 08/2010 -  She declines referal to pain clinic: " i have tried those clinics before and they don't work"

## 2012-09-27 NOTE — Assessment & Plan Note (Signed)
On metformin solo, dose increase early 05/2012 previously stopped amaryl for dizziness and hypoglycemia symptoms in 02/2012 Spouse reports home cbgs 90-120 fasting since stopping OHA 02/2012, reassurance provided  Lab Results  Component Value Date   HGBA1C 5.9 05/23/2012

## 2012-09-27 NOTE — Progress Notes (Signed)
Patient Care Team: Newt Lukes, MD as PCP - General (Internal Medicine) Duke Salvia, MD (Cardiology)   HPI  [name] is a 79 y.o. female is seen in followup for paroxysmal atrial fibrillation with a rapid ventricular response. This occurs in the context of hypertension and diabetes.   Her rhythm is stable; she feels cold lots of the time. Has not had chest pain or edema. Like his "ducky"   Past Medical History  Diagnosis Date  . HTN (hypertension)   . Hyperlipidemia   . Diabetes mellitus, type 2   . Paroxysmal atrial fibrillation     chronic anticoag - follows [facility]  . Right bundle branch block and incomplete left bundle branch block     with left axis deviation  . Chronic back pain     narcotic dependence  . Lumbar disc disease     dr. Dutch Quint  . Mitral valve prolapse   . Macular degeneration   . Gastritis     RUT negative, 2005  . Diverticulosis   . Alcohol abuse   . Compression fracture 02/2011    s/p KP  . Arthritis   . Depression   . Myocardial infarction   . COPD (chronic obstructive pulmonary disease)     Past Surgical History  Procedure Laterality Date  . Right ankle surgery    . Knee arthroscopy      Current Outpatient Prescriptions  Medication Sig Dispense Refill  . b complex vitamins tablet Take 1 tablet by mouth daily.      . Cholecalciferol (VITAMIN D) 1000 UNITS capsule Take 1,000 Units by mouth daily.        . diazepam (VALIUM) 5 MG tablet TAKE 1 TABLET BY MOUTH 4 TIMES A DAY AS NEEDED FOR STRESS  120 tablet  3  . fentaNYL (DURAGESIC - DOSED MCG/HR) 50 MCG/HR Place 1 patch (50 mcg total) onto the skin every 3 (three) days.  10 patch  0  . glucose blood (ONE TOUCH TEST STRIPS) test strip 1 each by Other route as needed. Use as instructed       . Memantine HCl ER (NAMENDA XR) 14 MG CP24 Take 1 capsule by mouth daily.  30 capsule  5  . metFORMIN (GLUCOPHAGE) 500 MG tablet TAKE 1 TABLET TWICE DAILY  60 tablet  5  . metoprolol tartrate  (LOPRESSOR) 25 MG tablet TAKE 1/2 TAB BY MOUTH TWICE DAILY  90 tablet  4  . oxyCODONE-acetaminophen (PERCOCET) 10-325 MG per tablet Take 1 tablet by mouth every 6 (six) hours as needed for pain. Do not fill more than once every 30 days.  120 tablet  0  . PARoxetine (PAXIL) 40 MG tablet Take 1 tablet (40 mg total) by mouth every morning.  90 tablet  1  . pravastatin (PRAVACHOL) 80 MG tablet TAKE ONCE A DAY  30 tablet  9  . ramipril (ALTACE) 5 MG capsule TAKE ONE CAPSULE BY MOUTH DAILY  30 capsule  4  . temazepam (RESTORIL) 30 MG capsule TAKE 1 CAPSULE BY MOUTH AT BEDTIME AS NEEDED  30 capsule  5  . tolterodine (DETROL LA) 4 MG 24 hr capsule Take 1 capsule (4 mg total) by mouth daily.  30 capsule  5  . verapamil (CALAN-SR) 180 MG CR tablet TAKE 1 TABLET BY MOUTH TWICE A DAY  180 tablet  3  . warfarin (COUMADIN) 5 MG tablet Take 2.5-5 mg by mouth daily. Takes 1/2 tablet on sun,tues,thur. And takes 1  tablet on mon,wed,fri and sat       No current facility-administered medications for this visit.    Allergies  Allergen Reactions  . Codeine Other (See Comments)    unknown  . Morphine And Related Other (See Comments)    Pt had severe respiratory depression and excessive sedation during a recent hospitalization and was told it was 2/2 morphine.    Review of Systems negative except from HPI and PMH  Physical Exam BP 96/57  Pulse 72  Wt 116 lb (52.617 kg)  BMI 22.65 kg/m2 Well developed and well nourished very kyphotic in no acute distress HENT normal E scleral and icterus clear Neck Supple   Clear to ausculation  Regular rate and rhythm, no murmurs gallops or rub Soft with active bowel sounds No clubbing cyanosis none Edema Alert and oriented, grossly normal motor and sensory function Skin Warm and Dry  ECG demonstrates sinus rhythm at 73 Intervals 15/12/43 Axis leftward -45  Assessment and  Plan

## 2012-10-01 ENCOUNTER — Telehealth: Payer: Self-pay | Admitting: Internal Medicine

## 2012-10-01 MED ORDER — NYSTATIN 100000 UNIT/ML MT SUSP: 500000.0000 [IU] | Freq: Four times a day (QID) | OROMUCOSAL | Status: DC

## 2012-10-01 NOTE — Telephone Encounter (Signed)
Nystatin solution - erx done

## 2012-10-01 NOTE — Telephone Encounter (Signed)
Patient is calling because she states her tongue "is on fire" and she can not eat or sleep, she wants something called in for this

## 2012-10-01 NOTE — Telephone Encounter (Signed)
Notified pt rx sent to pharmacy../lmb 

## 2012-10-02 ENCOUNTER — Encounter: Payer: Self-pay | Admitting: Internal Medicine

## 2012-10-09 ENCOUNTER — Encounter: Payer: Self-pay | Admitting: Internal Medicine

## 2012-10-09 ENCOUNTER — Telehealth: Payer: Self-pay | Admitting: Internal Medicine

## 2012-10-09 NOTE — Telephone Encounter (Signed)
The patient called hoping to get a refill of hydrocodone.

## 2012-10-10 MED ORDER — OXYCODONE-ACETAMINOPHEN 10-325 MG PO TABS: 1.0000 | ORAL_TABLET | Freq: Four times a day (QID) | ORAL | Status: DC | PRN

## 2012-10-10 NOTE — Telephone Encounter (Signed)
No - pt already on oxy and duragesic  i will not rx hydrocodone

## 2012-10-10 NOTE — Telephone Encounter (Signed)
Ok - monthly oxy refill done

## 2012-10-10 NOTE — Telephone Encounter (Signed)
Called pt spoke with husband he states it should have been the oxycodone rx. Wife hasn't taken hydrocodone in 2 years...Raechel Chute

## 2012-10-11 MED ORDER — OXYCODONE-ACETAMINOPHEN 10-325 MG PO TABS: 1.0000 | ORAL_TABLET | Freq: Four times a day (QID) | ORAL | Status: DC | PRN

## 2012-10-11 NOTE — Telephone Encounter (Signed)
MD forgot to sign not due bck in the office until Mon 6/2. Reprinted & regina sign. Pt notified rx ready fro pick-up...Raechel Chute

## 2012-10-19 ENCOUNTER — Telehealth: Payer: Self-pay | Admitting: Internal Medicine

## 2012-10-19 NOTE — Telephone Encounter (Signed)
noted 

## 2012-10-19 NOTE — Telephone Encounter (Signed)
The patient called and wanted to make sure Valentina Gu and Dr.Leschber were aware that she does not want any medical information shared with her husband. Her demographics have been updated to reflect this, but she was demanding in that this message be sent to directly to [facility] and [employer].

## 2012-10-23 ENCOUNTER — Ambulatory Visit (INDEPENDENT_AMBULATORY_CARE_PROVIDER_SITE_OTHER): Payer: Medicare Other | Admitting: Family Medicine

## 2012-10-23 DIAGNOSIS — Z7901 Long term (current) use of anticoagulants: Secondary | ICD-10-CM

## 2012-10-23 DIAGNOSIS — I4891 Unspecified atrial fibrillation: Secondary | ICD-10-CM

## 2012-10-23 LAB — POCT INR: INR: 2.5

## 2012-11-09 ENCOUNTER — Other Ambulatory Visit: Payer: Self-pay | Admitting: *Deleted

## 2012-11-09 ENCOUNTER — Telehealth: Payer: Self-pay | Admitting: Internal Medicine

## 2012-11-09 MED ORDER — FENTANYL 50 MCG/HR TD PT72: 1.0000 | MEDICATED_PATCH | TRANSDERMAL | Status: DC

## 2012-11-09 MED ORDER — NYSTATIN 100000 UNIT/ML MT SUSP: 500000.0000 [IU] | Freq: Four times a day (QID) | OROMUCOSAL | Status: DC

## 2012-11-09 MED ORDER — OXYCODONE-ACETAMINOPHEN 10-325 MG PO TABS: 1.0000 | ORAL_TABLET | Freq: Four times a day (QID) | ORAL | Status: DC | PRN

## 2012-11-09 NOTE — Telephone Encounter (Signed)
Left msg on vm needing refills on her oxycodone & fentanyl patches...[name]

## 2012-11-09 NOTE — Telephone Encounter (Signed)
Ok done

## 2012-11-09 NOTE — Telephone Encounter (Signed)
Notified pt rx ready for pick-up.../lmb 

## 2012-11-09 NOTE — Telephone Encounter (Signed)
Tongue is swollen, hot and red.  She is requesting what she had last time for this.  Mouth is also dry.

## 2012-11-12 NOTE — Telephone Encounter (Signed)
Pt notified med sent to her pharmacy...lmb

## 2012-11-16 ENCOUNTER — Other Ambulatory Visit: Payer: Self-pay | Admitting: Internal Medicine

## 2012-11-17 ENCOUNTER — Other Ambulatory Visit: Payer: Self-pay | Admitting: Internal Medicine

## 2012-11-19 ENCOUNTER — Other Ambulatory Visit: Payer: Self-pay | Admitting: General Practice

## 2012-11-19 MED ORDER — WARFARIN SODIUM 5 MG PO TABS: ORAL_TABLET | ORAL | Status: DC

## 2012-11-20 ENCOUNTER — Ambulatory Visit (INDEPENDENT_AMBULATORY_CARE_PROVIDER_SITE_OTHER): Payer: Medicare Other | Admitting: General Practice

## 2012-11-20 DIAGNOSIS — Z7901 Long term (current) use of anticoagulants: Secondary | ICD-10-CM

## 2012-11-20 DIAGNOSIS — I4891 Unspecified atrial fibrillation: Secondary | ICD-10-CM

## 2012-11-20 LAB — POCT INR: INR: 2.8

## 2012-11-28 ENCOUNTER — Other Ambulatory Visit: Payer: Self-pay | Admitting: Internal Medicine

## 2012-12-10 ENCOUNTER — Telehealth: Payer: Self-pay | Admitting: *Deleted

## 2012-12-10 NOTE — Telephone Encounter (Signed)
Ok to refill both?? 

## 2012-12-10 NOTE — Telephone Encounter (Signed)
Pt called requesting refill on Oxycodone and Fentanyl Patch.  Last OV 5.15.14  Please advise

## 2012-12-11 MED ORDER — FENTANYL 50 MCG/HR TD PT72: 1.0000 | MEDICATED_PATCH | TRANSDERMAL | Status: DC

## 2012-12-11 MED ORDER — OXYCODONE-ACETAMINOPHEN 10-325 MG PO TABS: 1.0000 | ORAL_TABLET | Freq: Four times a day (QID) | ORAL | Status: DC | PRN

## 2012-12-11 NOTE — Telephone Encounter (Signed)
Rx complete.  Spoke with pts husband, he will pick up Rx this afternoon.

## 2012-12-26 ENCOUNTER — Other Ambulatory Visit: Payer: Self-pay | Admitting: *Deleted

## 2012-12-26 ENCOUNTER — Ambulatory Visit (INDEPENDENT_AMBULATORY_CARE_PROVIDER_SITE_OTHER): Payer: Medicare Other | Admitting: General Practice

## 2012-12-26 DIAGNOSIS — Z7901 Long term (current) use of anticoagulants: Secondary | ICD-10-CM

## 2012-12-26 DIAGNOSIS — I4891 Unspecified atrial fibrillation: Secondary | ICD-10-CM

## 2012-12-26 LAB — POCT INR: INR: 2.6

## 2012-12-26 MED ORDER — TEMAZEPAM 30 MG PO CAPS: ORAL_CAPSULE | ORAL | Status: DC

## 2012-12-26 NOTE — Telephone Encounter (Signed)
[name] left and forgot to sign rx call into pharmacy spoke with Ed/pharmacist gave [name] approval status...lmb

## 2012-12-27 ENCOUNTER — Other Ambulatory Visit: Payer: Self-pay | Admitting: Internal Medicine

## 2012-12-29 ENCOUNTER — Other Ambulatory Visit: Payer: Self-pay | Admitting: Internal Medicine

## 2012-12-31 ENCOUNTER — Ambulatory Visit: Payer: Medicare Other | Admitting: Physician Assistant

## 2013-01-09 ENCOUNTER — Ambulatory Visit (INDEPENDENT_AMBULATORY_CARE_PROVIDER_SITE_OTHER): Payer: Medicare Other | Admitting: Internal Medicine

## 2013-01-09 ENCOUNTER — Other Ambulatory Visit (INDEPENDENT_AMBULATORY_CARE_PROVIDER_SITE_OTHER): Payer: Medicare Other

## 2013-01-09 ENCOUNTER — Encounter: Payer: Self-pay | Admitting: Internal Medicine

## 2013-01-09 VITALS — BP 120/62 | HR 66 | Temp 97.8°F

## 2013-01-09 DIAGNOSIS — E785 Hyperlipidemia, unspecified: Secondary | ICD-10-CM

## 2013-01-09 DIAGNOSIS — I4891 Unspecified atrial fibrillation: Secondary | ICD-10-CM

## 2013-01-09 DIAGNOSIS — M545 Low back pain, unspecified: Secondary | ICD-10-CM

## 2013-01-09 DIAGNOSIS — IMO0001 Reserved for inherently not codable concepts without codable children: Secondary | ICD-10-CM

## 2013-01-09 DIAGNOSIS — R413 Other amnesia: Secondary | ICD-10-CM

## 2013-01-09 LAB — CBC WITH DIFFERENTIAL/PLATELET
Basophils Relative: 0.4 % (ref 0.0–3.0)
Eosinophils Relative: 1.8 % (ref 0.0–5.0)
HCT: 40.8 % (ref 36.0–46.0)
Hemoglobin: 13.9 g/dL (ref 12.0–15.0)
Lymphocytes Relative: 30.1 % (ref 12.0–46.0)
Lymphs Abs: 1.6 10*3/uL (ref 0.7–4.0)
Monocytes Relative: 8.3 % (ref 3.0–12.0)
Neutro Abs: 3.2 10*3/uL (ref 1.4–7.7)
RBC: 3.97 Mil/uL (ref 3.87–5.11)
RDW: 12.3 % (ref 11.5–14.6)
WBC: 5.3 10*3/uL (ref 4.5–10.5)

## 2013-01-09 LAB — URINALYSIS, ROUTINE W REFLEX MICROSCOPIC
Bilirubin Urine: NEGATIVE
Ketones, ur: NEGATIVE
RBC / HPF: NONE SEEN (ref 0–?)
Specific Gravity, Urine: 1.015 (ref 1.000–1.030)
Total Protein, Urine: NEGATIVE
pH: 6.5 (ref 5.0–8.0)

## 2013-01-09 LAB — MICROALBUMIN / CREATININE URINE RATIO
Creatinine,U: 72.3 mg/dL
Microalb, Ur: 0.1 mg/dL (ref 0.0–1.9)

## 2013-01-09 MED ORDER — OXYCODONE-ACETAMINOPHEN 10-325 MG PO TABS: 1.0000 | ORAL_TABLET | Freq: Four times a day (QID) | ORAL | Status: DC | PRN

## 2013-01-09 MED ORDER — FENTANYL 50 MCG/HR TD PT72: 1.0000 | MEDICATED_PATCH | TRANSDERMAL | Status: DC

## 2013-01-09 NOTE — Assessment & Plan Note (Signed)
On metformin solo, dose increase early 05/2012 previously stopped amaryl for dizziness and hypoglycemia symptoms in 02/2012 Spouse reports home cbgs 90-120 fasting since stopping OHA 02/2012, reassurance provided  Lab Results  Component Value Date   HGBA1C 5.9 05/23/2012

## 2013-01-09 NOTE — Progress Notes (Signed)
Subjective:    Patient ID: [name], female    DOB: 28-Oct-1932, 79 y.o.   MRN: [id number]  HPI here for follow up - reviewed chronic medical issues:  Dementia, progressive memory problems. Begin Namenda April 2014 - prior spouse, no significant change in behavior, remains argumentative and repetitive. Potential side effects related to same including diarrhea, increased thirst and dizziness  OAB - started vesicar 03/2012, changed to Detrol LA 05/2012 -initially improved, but persisting symptoms -  diabetes mellitus - infrequently check cbgs - stopped amaryl 03/2012 due to symptomatic hypoglycemia - fasting cbgs 114-140  hypertension - reviewed home BP log - variable - the patient reports compliance with medication(s) as prescribed. Denies adverse side effects.  Also requests pain med refills  Past Medical History  Diagnosis Date  . HTN (hypertension)   . Hyperlipidemia   . Diabetes mellitus, type 2   . Paroxysmal atrial fibrillation     chronic anticoag - follows [facility]  . Right bundle branch block and incomplete left bundle branch block     with left axis deviation  . Chronic back pain     narcotic dependence  . Lumbar disc disease     dr. Dutch Quint  . Mitral valve prolapse   . Macular degeneration   . Gastritis     RUT negative, 2005  . Diverticulosis   . Alcohol abuse   . Compression fracture 02/2011    s/p KP  . Arthritis   . Depression   . Myocardial infarction   . COPD (chronic obstructive pulmonary disease)      Review of Systems  Constitutional: Negative for fever, activity change and appetite change.  Respiratory: Negative for cough, chest tightness, shortness of breath and wheezing.   Cardiovascular: Negative for chest pain and palpitations.  Gastrointestinal: Positive for constipation. Negative for abdominal pain and blood in stool.  Genitourinary: Positive for urgency and frequency. Negative for dysuria, hematuria, flank pain and pelvic pain.   Musculoskeletal: Positive for myalgias and back pain (chronic).  Neurological: Negative for dizziness and headaches.  Psychiatric/Behavioral: Positive for decreased concentration. The patient is nervous/anxious (chronic).        Objective:   Physical Exam BP 120/62  Pulse 66  Temp(Src) 97.8 F (36.6 C) (Oral)  SpO2 96% Wt Readings from Last 3 Encounters:  09/27/12 116 lb (52.617 kg)  06/08/12 108 lb 12.8 oz (49.351 kg)  05/23/12 110 lb 12.8 oz (50.259 kg)    Gen: NAD - tremulous and kyphotic without change -  Lungs: CTA B CV: irreg irreg, no edema Abdomen: firm, NTND, +BS - rectal deferred Neuro: AAOx2, no CN deficits - MAE and gait/balance unchanged Psychiatric - rambling and tangential - oriented to person, place but poor short term recall (0/3 at 5 min)  Lab Results  Component Value Date   WBC 14.6* 05/15/2012   HGB 10.3* 05/15/2012   HCT 29.8* 05/15/2012   PLT 289 05/15/2012   GLUCOSE 140* 05/15/2012   CHOL 127 11/16/2011   TRIG 143.0 11/16/2011   HDL 48.50 11/16/2011   LDLCALC 50 11/16/2011   ALT 20 11/16/2011   AST 30 11/16/2011   NA 130* 05/15/2012   K 4.1 05/15/2012   CL 94* 05/15/2012   CREATININE 0.40* 05/15/2012   BUN 10 05/15/2012   CO2 26 05/15/2012   TSH 0.74 11/16/2011   INR 2.6 12/26/2012   HGBA1C 5.9 05/23/2012   MICROALBUR 0.8 10/01/2009       Assessment & Plan:   See  problem list. Medications and labs reviewed today.

## 2013-01-09 NOTE — Patient Instructions (Signed)
It was good to see you today. We have reviewed your prior records including labs and tests today Medications reviewed and updated -  stop Detrol at this time because of side effects (dry mouth, dizzy) Continue the Namenda XR for memory -  Refill on medication(s) as discussed today. Test(s) ordered today. Your results will be released to MyChart (or called to you) after review, usually within 72hours after test completion. If any changes need to be made, you will be notified at that same time. Please schedule followup in 4 months, call sooner if problems.

## 2013-01-09 NOTE — Assessment & Plan Note (Signed)
Chronic problems - narcotics maintained for same So long as no escalating dose or change in pain, i will continue to fill medications as ongoing hx vert compression fx and VP 08/2010 -  She declines referal to pain clinic: " i have tried those clinics before and they don't work"

## 2013-01-09 NOTE — Assessment & Plan Note (Signed)
Progressive decline - complicated by chronic high dose narcotics/BZs Check screening labs now began namenda xr 09/2012 - ?benefit vs side effects -  encouraged to continue same for now

## 2013-01-09 NOTE — Assessment & Plan Note (Signed)
On prava - Check lipids annually

## 2013-01-09 NOTE — Assessment & Plan Note (Signed)
PAF, on CCB and coumadin anticoag Considering alt anticoag, but cost prohibitive at this time Continue working with EP and follow with anticoag clinic as ongoing

## 2013-01-10 LAB — BASIC METABOLIC PANEL
Calcium: 9.3 mg/dL (ref 8.4–10.5)
GFR: 102.85 mL/min (ref >60.00)
Glucose, Bld: 76 mg/dL (ref 70–99)
Potassium: 4.5 mEq/L (ref 3.5–5.1)
Sodium: 136 mEq/L (ref 135–145)

## 2013-01-10 LAB — HEPATIC FUNCTION PANEL
ALT: 14 U/L (ref 0–35)
AST: 23 U/L (ref 0–37)
Albumin: 4.3 g/dL (ref 3.5–5.2)
Total Bilirubin: 0.6 mg/dL (ref 0.3–1.2)

## 2013-01-10 LAB — LIPID PANEL
HDL: 45.5 mg/dL (ref >39.00)
Triglycerides: 93 mg/dL (ref 0.0–149.0)
VLDL: 18.6 mg/dL (ref 0.0–40.0)

## 2013-01-10 LAB — TSH: TSH: 0.63 u[IU]/mL (ref 0.35–5.50)

## 2013-01-18 ENCOUNTER — Ambulatory Visit: Payer: Medicare Other | Admitting: Physician Assistant

## 2013-01-21 ENCOUNTER — Encounter (HOSPITAL_COMMUNITY): Payer: Self-pay | Admitting: *Deleted

## 2013-01-21 ENCOUNTER — Emergency Department (HOSPITAL_COMMUNITY): Payer: Medicare Other

## 2013-01-21 ENCOUNTER — Other Ambulatory Visit (HOSPITAL_COMMUNITY): Payer: Medicare Other

## 2013-01-21 ENCOUNTER — Other Ambulatory Visit: Payer: Self-pay

## 2013-01-21 ENCOUNTER — Inpatient Hospital Stay (HOSPITAL_COMMUNITY)
Admission: EM | Admit: 2013-01-21 | Discharge: 2013-01-25 | DRG: 605 | Disposition: A | Discharge status: Skilled Nursing Facility | Payer: Medicare Other | Attending: Internal Medicine | Admitting: Internal Medicine

## 2013-01-21 DIAGNOSIS — J4489 Other specified chronic obstructive pulmonary disease: Secondary | ICD-10-CM | POA: Diagnosis present

## 2013-01-21 DIAGNOSIS — D649 Anemia, unspecified: Secondary | ICD-10-CM

## 2013-01-21 DIAGNOSIS — I4891 Unspecified atrial fibrillation: Secondary | ICD-10-CM | POA: Diagnosis present

## 2013-01-21 DIAGNOSIS — R413 Other amnesia: Secondary | ICD-10-CM | POA: Diagnosis present

## 2013-01-21 DIAGNOSIS — W19XXXD Unspecified fall, subsequent encounter: Secondary | ICD-10-CM

## 2013-01-21 DIAGNOSIS — R791 Abnormal coagulation profile: Secondary | ICD-10-CM | POA: Diagnosis present

## 2013-01-21 DIAGNOSIS — Z7901 Long term (current) use of anticoagulants: Secondary | ICD-10-CM

## 2013-01-21 DIAGNOSIS — I252 Old myocardial infarction: Secondary | ICD-10-CM

## 2013-01-21 DIAGNOSIS — E119 Type 2 diabetes mellitus without complications: Secondary | ICD-10-CM | POA: Diagnosis present

## 2013-01-21 DIAGNOSIS — E785 Hyperlipidemia, unspecified: Secondary | ICD-10-CM | POA: Diagnosis present

## 2013-01-21 DIAGNOSIS — I959 Hypotension, unspecified: Secondary | ICD-10-CM | POA: Diagnosis not present

## 2013-01-21 DIAGNOSIS — J449 Chronic obstructive pulmonary disease, unspecified: Secondary | ICD-10-CM | POA: Diagnosis present

## 2013-01-21 DIAGNOSIS — W19XXXA Unspecified fall, initial encounter: Secondary | ICD-10-CM

## 2013-01-21 DIAGNOSIS — H3530 Unspecified macular degeneration: Secondary | ICD-10-CM | POA: Diagnosis present

## 2013-01-21 DIAGNOSIS — E559 Vitamin D deficiency, unspecified: Secondary | ICD-10-CM | POA: Diagnosis present

## 2013-01-21 DIAGNOSIS — F341 Dysthymic disorder: Secondary | ICD-10-CM

## 2013-01-21 DIAGNOSIS — F0390 Unspecified dementia without behavioral disturbance: Secondary | ICD-10-CM | POA: Diagnosis present

## 2013-01-21 DIAGNOSIS — D638 Anemia in other chronic diseases classified elsewhere: Secondary | ICD-10-CM | POA: Diagnosis present

## 2013-01-21 DIAGNOSIS — D72829 Elevated white blood cell count, unspecified: Secondary | ICD-10-CM

## 2013-01-21 DIAGNOSIS — F3289 Other specified depressive episodes: Secondary | ICD-10-CM | POA: Diagnosis present

## 2013-01-21 DIAGNOSIS — F329 Major depressive disorder, single episode, unspecified: Secondary | ICD-10-CM | POA: Diagnosis present

## 2013-01-21 DIAGNOSIS — F17200 Nicotine dependence, unspecified, uncomplicated: Secondary | ICD-10-CM | POA: Diagnosis present

## 2013-01-21 DIAGNOSIS — S7010XA Contusion of unspecified thigh, initial encounter: Principal | ICD-10-CM | POA: Diagnosis present

## 2013-01-21 DIAGNOSIS — S7012XA Contusion of left thigh, initial encounter: Secondary | ICD-10-CM | POA: Diagnosis present

## 2013-01-21 DIAGNOSIS — D689 Coagulation defect, unspecified: Secondary | ICD-10-CM

## 2013-01-21 DIAGNOSIS — E1149 Type 2 diabetes mellitus with other diabetic neurological complication: Secondary | ICD-10-CM | POA: Diagnosis present

## 2013-01-21 DIAGNOSIS — T148XXA Other injury of unspecified body region, initial encounter: Secondary | ICD-10-CM

## 2013-01-21 DIAGNOSIS — Z9181 History of falling: Secondary | ICD-10-CM

## 2013-01-21 DIAGNOSIS — I10 Essential (primary) hypertension: Secondary | ICD-10-CM

## 2013-01-21 DIAGNOSIS — F411 Generalized anxiety disorder: Secondary | ICD-10-CM | POA: Diagnosis present

## 2013-01-21 HISTORY — DX: Unspecified fall, initial encounter: W19.XXXA

## 2013-01-21 LAB — CBC WITH DIFFERENTIAL/PLATELET
Basophils Absolute: 0 10*3/uL (ref 0.0–0.1)
Basophils Relative: 0 % (ref 0–1)
Basophils Relative: 0 % (ref 0–1)
Eosinophils Absolute: 0.1 10*3/uL (ref 0.0–0.7)
Eosinophils Absolute: 0 10*3/uL (ref 0.0–0.7)
Eosinophils Relative: 0 % (ref 0–5)
Hemoglobin: 8.8 g/dL — ABNORMAL LOW (ref 12.0–15.0)
Lymphs Abs: 1.9 10*3/uL (ref 0.7–4.0)
MCH: 34.9 pg — ABNORMAL HIGH (ref 26.0–34.0)
MCH: 34.5 pg — ABNORMAL HIGH (ref 26.0–34.0)
MCHC: 34.5 g/dL (ref 30.0–36.0)
MCHC: 34 g/dL (ref 30.0–36.0)
MCV: 101.6 fL — ABNORMAL HIGH (ref 78.0–100.0)
Monocytes Relative: 12 % (ref 3–12)
Monocytes Relative: 11 % (ref 3–12)
Neutro Abs: 8.8 10*3/uL — ABNORMAL HIGH (ref 1.7–7.7)
Neutrophils Relative %: 71 % (ref 43–77)
Platelets: 191 10*3/uL (ref 150–400)
Platelets: 159 10*3/uL (ref 150–400)
RBC: 2.55 MIL/uL — ABNORMAL LOW (ref 3.87–5.11)
RDW: 12.6 % (ref 11.5–15.5)

## 2013-01-21 LAB — COMPREHENSIVE METABOLIC PANEL
ALT: 13 U/L (ref 0–35)
Alkaline Phosphatase: 43 U/L (ref 39–117)
BUN: 22 mg/dL (ref 6–23)
Chloride: 101 mEq/L (ref 96–112)
GFR calc Af Amer: >90 mL/min (ref >90)
Glucose, Bld: 92 mg/dL (ref 70–99)
Potassium: 4.2 mEq/L (ref 3.5–5.1)
Sodium: 136 mEq/L (ref 135–145)
Total Bilirubin: 0.5 mg/dL (ref 0.3–1.2)
Total Protein: 5.8 g/dL — ABNORMAL LOW (ref 6.0–8.3)

## 2013-01-21 LAB — BASIC METABOLIC PANEL
BUN: 21 mg/dL (ref 6–23)
GFR calc Af Amer: >90 mL/min (ref >90)
GFR calc non Af Amer: 87 mL/min — ABNORMAL LOW (ref >90)
Potassium: 4.2 mEq/L (ref 3.5–5.1)
Sodium: 136 mEq/L (ref 135–145)

## 2013-01-21 LAB — GLUCOSE, CAPILLARY: Glucose-Capillary: 130 mg/dL — ABNORMAL HIGH (ref 70–99)

## 2013-01-21 LAB — URINALYSIS, ROUTINE W REFLEX MICROSCOPIC
Leukocytes, UA: NEGATIVE
Nitrite: NEGATIVE
Specific Gravity, Urine: 1.034 — ABNORMAL HIGH (ref 1.005–1.030)
Urobilinogen, UA: 0.2 mg/dL (ref 0.0–1.0)

## 2013-01-21 LAB — PROTIME-INR: Prothrombin Time: 41.2 seconds — ABNORMAL HIGH (ref 11.6–15.2)

## 2013-01-21 LAB — ABO/RH: ABO/RH(D): O POS

## 2013-01-21 LAB — CK: Total CK: 502 U/L — ABNORMAL HIGH (ref 7–177)

## 2013-01-21 MED ORDER — METFORMIN HCL 500 MG PO TABS
500.0000 mg | ORAL_TABLET | Freq: Two times a day (BID) | ORAL | Status: DC
  Administered 2013-01-21: 17:00:00 500 mg via ORAL
  Filled 2013-01-21 (×4): qty 1

## 2013-01-21 MED ORDER — TEMAZEPAM 15 MG PO CAPS
30.0000 mg | ORAL_CAPSULE | Freq: Every day | ORAL | Status: DC
  Administered 2013-01-21 – 2013-01-24 (×4): 30 mg via ORAL
  Filled 2013-01-21 (×4): qty 2

## 2013-01-21 MED ORDER — ACETAMINOPHEN 650 MG RE SUPP: 650.0000 mg | Freq: Four times a day (QID) | RECTAL | Status: DC | PRN

## 2013-01-21 MED ORDER — OXYCODONE HCL 5 MG PO TABS
5.0000 mg | ORAL_TABLET | Freq: Four times a day (QID) | ORAL | Status: DC | PRN
  Administered 2013-01-21 – 2013-01-23 (×5): 5 mg via ORAL
  Filled 2013-01-21 (×5): qty 1

## 2013-01-21 MED ORDER — FENTANYL CITRATE 0.05 MG/ML IJ SOLN: 25.0000 ug | INTRAMUSCULAR | Status: DC | PRN

## 2013-01-21 MED ORDER — DIAZEPAM 5 MG PO TABS
5.0000 mg | ORAL_TABLET | Freq: Four times a day (QID) | ORAL | Status: DC | PRN
  Administered 2013-01-22 – 2013-01-24 (×4): 5 mg via ORAL
  Filled 2013-01-21 (×5): qty 1

## 2013-01-21 MED ORDER — OXYCODONE-ACETAMINOPHEN 5-325 MG PO TABS
1.0000 | ORAL_TABLET | Freq: Four times a day (QID) | ORAL | Status: DC | PRN
  Administered 2013-01-21 – 2013-01-24 (×6): 1 via ORAL
  Filled 2013-01-21 (×7): qty 1

## 2013-01-21 MED ORDER — SODIUM CHLORIDE 0.9 % IJ SOLN
3.0000 mL | Freq: Two times a day (BID) | INTRAMUSCULAR | Status: DC
  Administered 2013-01-22 – 2013-01-25 (×3): 3 mL via INTRAVENOUS

## 2013-01-21 MED ORDER — ONDANSETRON HCL 4 MG/2ML IJ SOLN: 4.0000 mg | Freq: Four times a day (QID) | INTRAMUSCULAR | Status: DC | PRN

## 2013-01-21 MED ORDER — SIMVASTATIN 40 MG PO TABS
40.0000 mg | ORAL_TABLET | Freq: Every day | ORAL | Status: DC
  Administered 2013-01-21: 17:00:00 40 mg via ORAL
  Filled 2013-01-21 (×2): qty 1

## 2013-01-21 MED ORDER — GLIMEPIRIDE 2 MG PO TABS
2.0000 mg | ORAL_TABLET | Freq: Two times a day (BID) | ORAL | Status: DC
  Administered 2013-01-21: 2 mg via ORAL
  Filled 2013-01-21 (×4): qty 1

## 2013-01-21 MED ORDER — MEMANTINE HCL 5 MG PO TABS
5.0000 mg | ORAL_TABLET | Freq: Two times a day (BID) | ORAL | Status: DC
  Administered 2013-01-21 – 2013-01-25 (×8): 5 mg via ORAL
  Filled 2013-01-21 (×9): qty 1

## 2013-01-21 MED ORDER — ACETAMINOPHEN 325 MG PO TABS
650.0000 mg | ORAL_TABLET | Freq: Four times a day (QID) | ORAL | Status: DC | PRN
  Administered 2013-01-23: 650 mg via ORAL
  Filled 2013-01-21: qty 2

## 2013-01-21 MED ORDER — SODIUM CHLORIDE 0.9 % IV SOLN
INTRAVENOUS | Status: DC
  Administered 2013-01-21 – 2013-01-25 (×5): via INTRAVENOUS

## 2013-01-21 MED ORDER — PAROXETINE HCL 20 MG PO TABS: 40.0000 mg | ORAL_TABLET | Freq: Every morning | ORAL | Status: DC

## 2013-01-21 MED ORDER — FENTANYL CITRATE 0.05 MG/ML IJ SOLN
25.0000 ug | INTRAMUSCULAR | Status: DC | PRN
  Filled 2013-01-21: qty 2

## 2013-01-21 MED ORDER — MEMANTINE HCL ER 14 MG PO CP24: 1.0000 | ORAL_CAPSULE | Freq: Every day | ORAL | Status: DC

## 2013-01-21 MED ORDER — OXYCODONE-ACETAMINOPHEN 10-325 MG PO TABS: 1.0000 | ORAL_TABLET | Freq: Four times a day (QID) | ORAL | Status: DC | PRN

## 2013-01-21 MED ORDER — ONDANSETRON HCL 4 MG/2ML IJ SOLN
4.0000 mg | Freq: Once | INTRAMUSCULAR | Status: DC
  Filled 2013-01-21: qty 2

## 2013-01-21 MED ORDER — B COMPLEX PO TABS: 1.0000 | ORAL_TABLET | Freq: Every day | ORAL | Status: DC

## 2013-01-21 MED ORDER — ONDANSETRON HCL 4 MG PO TABS: 4.0000 mg | ORAL_TABLET | Freq: Four times a day (QID) | ORAL | Status: DC | PRN

## 2013-01-21 MED ORDER — FA-PYRIDOXINE-CYANOCOBALAMIN 2.5-25-2 MG PO TABS
1.0000 | ORAL_TABLET | Freq: Every day | ORAL | Status: DC
  Administered 2013-01-22 – 2013-01-25 (×4): 1 via ORAL
  Filled 2013-01-21 (×4): qty 1

## 2013-01-21 MED ORDER — VITAMIN D3 25 MCG (1000 UNIT) PO TABS
1000.0000 [IU] | ORAL_TABLET | Freq: Every day | ORAL | Status: DC
  Administered 2013-01-21 – 2013-01-25 (×5): 1000 [IU] via ORAL
  Filled 2013-01-21 (×5): qty 1

## 2013-01-21 MED ORDER — VITAMIN D 1000 UNITS PO CAPS: 1000.0000 [IU] | ORAL_CAPSULE | Freq: Every day | ORAL | Status: DC

## 2013-01-21 MED ORDER — PAROXETINE HCL 20 MG PO TABS
40.0000 mg | ORAL_TABLET | Freq: Every day | ORAL | Status: DC
  Administered 2013-01-22 – 2013-01-25 (×4): 40 mg via ORAL
  Filled 2013-01-21 (×4): qty 2

## 2013-01-21 NOTE — ED Notes (Signed)
Pt's Husband Joe cell # [phone]. Home [phone]

## 2013-01-21 NOTE — ED Notes (Signed)
Bed: WA20 Expected date:  Expected time:  Means of arrival:  Comments: [id number]

## 2013-01-21 NOTE — ED Provider Notes (Signed)
CSN: [id number]     Arrival date & time 01/21/13  0756 History   First MD Initiated Contact with Patient 01/21/13 0757     Chief Complaint  Patient presents with  . Hip Pain    HPI   She has apparently had multiple falls in the last few weeks. Her most recent fall believe is Friday. However she is uncertain she may have fallen yesterday. She complains of pain in her left leg and was unable to bear weight this morning. EMS was called. She is given some IV pain medication en route. Some pain and swelling to the mid left femur. She denies syncope. She denies fever. She denies other recent illness. Does have history of non-insulin-dependent diabetes chronic pain hypertension depression/anxiety hypercholesterolemia.  Past Medical History  Diagnosis Date  . HTN (hypertension)   . Hyperlipidemia   . Diabetes mellitus, type 2   . Paroxysmal atrial fibrillation     chronic anticoag - follows [facility]  . Right bundle branch block and incomplete left bundle branch block     with left axis deviation  . Chronic back pain     narcotic dependence  . Lumbar disc disease     dr. Dutch Quint  . Mitral valve prolapse   . Macular degeneration   . Gastritis     RUT negative, 2005  . Diverticulosis   . Alcohol abuse   . Compression fracture 02/2011    s/p KP  . Arthritis   . Depression   . Myocardial infarction   . COPD (chronic obstructive pulmonary disease)    Past Surgical History  Procedure Laterality Date  . Right ankle surgery    . Knee arthroscopy     Family History  Problem Relation Age of Onset  . Heart disease Mother   . Cancer Father     throat  . Atrial fibrillation Sister    History  Substance Use Topics  . Smoking status: Current Every Day Smoker -- 0.50 packs/day for 65 years    Types: Cigarettes  . Smokeless tobacco: Never Used  . Alcohol Use: Yes     Comment: Occasional   OB History   Grav Para Term Preterm Abortions TAB SAB Ect Mult Living                 Review of  Systems  Constitutional: Negative for fever, chills, diaphoresis, appetite change and fatigue.  HENT: Negative for sore throat, mouth sores and trouble swallowing.   Eyes: Negative for visual disturbance.  Respiratory: Negative for cough, chest tightness, shortness of breath and wheezing.   Cardiovascular: Negative for chest pain.  Gastrointestinal: Negative for nausea, vomiting, abdominal pain, diarrhea and abdominal distention.  Endocrine: Negative for polydipsia, polyphagia and polyuria.  Genitourinary: Negative for dysuria, frequency and hematuria.  Musculoskeletal: Positive for myalgias. Negative for gait problem.       Left leg pain and swelling  Skin: Negative for color change, pallor and rash.       No bleeding bruising or breaks in the skin  Neurological: Negative for dizziness, syncope, light-headedness and headaches.  Hematological: Does not bruise/bleed easily.  Psychiatric/Behavioral: Negative for behavioral problems and confusion.    Allergies  Codeine and Morphine and related  Home Medications   Current Outpatient Rx  Name  Route  Sig  Dispense  Refill  . acetaminophen (TYLENOL) 500 MG tablet   Oral   Take 1,000 mg by mouth every 6 (six) hours as needed for pain.         Marland Kitchen  b complex vitamins tablet   Oral   Take 1 tablet by mouth daily.         . Cholecalciferol (VITAMIN D) 1000 UNITS capsule   Oral   Take 1,000 Units by mouth daily.           . diazepam (VALIUM) 5 MG tablet   Oral   Take 5 mg by mouth every 6 (six) hours as needed for anxiety.         . fentaNYL (DURAGESIC - DOSED MCG/HR) 50 MCG/HR   Transdermal   Place 1 patch (50 mcg total) onto the skin every 3 (three) days.   10 patch   0     DO NOT FILL BEFORE 2nd day of each month   . glimepiride (AMARYL) 2 MG tablet   Oral   Take 2 mg by mouth 2 (two) times daily.         . Memantine HCl ER (NAMENDA XR) 14 MG CP24   Oral   Take 1 capsule by mouth daily.   30 capsule   5   .  metFORMIN (GLUCOPHAGE) 500 MG tablet   Oral   Take 500 mg by mouth 2 (two) times daily with a meal.         . metoprolol tartrate (LOPRESSOR) 25 MG tablet   Oral   Take 12.5 mg by mouth 2 (two) times daily.         Marland Kitchen oxyCODONE-acetaminophen (PERCOCET) 10-325 MG per tablet   Oral   Take 1 tablet by mouth every 6 (six) hours as needed for pain. Do not fill more than once every 30 days.   120 tablet   0   . PARoxetine (PAXIL) 40 MG tablet   Oral   Take 40 mg by mouth every morning.         . pravastatin (PRAVACHOL) 80 MG tablet   Oral   Take 80 mg by mouth daily.         . ramipril (ALTACE) 5 MG capsule   Oral   Take 5 mg by mouth daily.         . temazepam (RESTORIL) 30 MG capsule   Oral   Take 30 mg by mouth at bedtime.          . verapamil (CALAN-SR) 180 MG CR tablet   Oral   Take 180 mg by mouth 2 (two) times daily.         Marland Kitchen warfarin (COUMADIN) 5 MG tablet   Oral   Take 5 mg by mouth daily. 0.5 tab on Sundays and Thursdays; 1 tab daily otherwise         . glucose blood (ONE TOUCH TEST STRIPS) test strip   Other   1 each by Other route as needed. Use as instructed           BP 92/50  Pulse 64  Temp(Src) 98.3 F (36.8 C) (Oral)  Resp 16  SpO2 93% Physical Exam  Constitutional: She is oriented to person, place, and time. She appears well-developed and well-nourished. No distress.  No distress as long as she is holding still. She has pain with any movement the left leg  HENT:  Head: Normocephalic.  Eyes: Conjunctivae are normal. Pupils are equal, round, and reactive to light. No scleral icterus.    Neck: Normal range of motion. Neck supple. No thyromegaly present.  Cardiovascular: Normal rate and regular rhythm.  Exam reveals no gallop and no friction rub.  No murmur heard. Sinus rhythm on the monitor. No ectopy.  Pulmonary/Chest: Effort normal and breath sounds normal. No respiratory distress. She has no wheezes. She has no rales.  Clear  lungs  Abdominal: Soft. Bowel sounds are normal. She exhibits no distension. There is no tenderness. There is no rebound.  Musculoskeletal: Normal range of motion.  Left femur is firm. Not frankly tense. She has good sensation distally. Good cap refill distally. No paresis. Not an exam consistent with compartment.  Neurological: She is alert and oriented to person, place, and time.  Skin: Skin is warm and dry. No rash noted.  No bruising, bleeding, or ecchymosis in the area of the mid femur  Psychiatric: She has a normal mood and affect. Her behavior is normal.    ED Course  Procedures (including critical care time) Labs Review Labs Reviewed  CBC WITH DIFFERENTIAL - Abnormal; Notable for the following:    WBC 12.4 (*)    RBC 2.84 (*)    Hemoglobin 9.9 (*)    HCT 28.7 (*)    MCV 101.1 (*)    MCH 34.9 (*)    Neutro Abs 8.8 (*)    Monocytes Absolute 1.5 (*)    All other components within normal limits  BASIC METABOLIC PANEL - Abnormal; Notable for the following:    Glucose, Bld 105 (*)    GFR calc non Af Amer 87 (*)    All other components within normal limits  PROTIME-INR - Abnormal; Notable for the following:    Prothrombin Time 41.2 (*)    INR 4.53 (*)    All other components within normal limits  CK - Abnormal; Notable for the following:    Total CK 502 (*)    All other components within normal limits  URINE CULTURE  URINALYSIS, ROUTINE W REFLEX MICROSCOPIC  TYPE AND SCREEN  ABO/RH  PREPARE FRESH FROZEN PLASMA   EKG: Indication preop. Interpretation sinus rhythm (which pattern. Comparison is made with 08/26/2010 shows atrial fibrillation. Today's EKG shows a sinus based rhythm Imaging Review Dg Chest 1 View  01/21/2013   *RADIOLOGY REPORT*  Clinical Data: Hip pain secondary to recent falls.  CHEST - 1 VIEW  Comparison: Chest x-ray dated 05/03/2012  Findings: Heart size and pulmonary vascularity are normal.  Slight linear atelectasis at the left lung base.  Chronic  peribronchial thickening.  Old compression fracture of the mid thoracic spine treated with vertebroplasty.  No acute osseous abnormality.  IMPRESSION: Slight atelectasis at the left lung base.   Original Report Authenticated By: Francene Boyers, M.D.   Dg Hip Complete Left  01/21/2013   *RADIOLOGY REPORT*  Clinical Data: Left hip pain.  Low back pain.  Recent falls.  LEFT HIP - COMPLETE 2+ VIEW  Comparison: Radiographs and CT scan dated 05/15/2012  Findings: There is abnormal sclerosis as well as inhomogeneous lucency in both femoral heads consistent with moderate osteoarthritis as visualized on the prior CT scan.  There is no acute fracture or dislocation.  There is an old deformity of the left side of the symphysis and of the left superior and inferior pubic rami consistent with old healed fractures.  There is a rotoscoliosis of the lumbar spine with degenerative disc and joint disease.  IMPRESSION:  1.  No acute osseous abnormality. Moderate arthritis of both hip joints. 3.  Old fractures of the left ischium.   Original Report Authenticated By: Francene Boyers, M.D.   Dg Femur Left  01/21/2013   *RADIOLOGY REPORT*  Clinical Data: Left thigh pain  LEFT FEMUR - 2 VIEW  Comparison: None.  Findings: No acute fracture or dislocation is noted.  Significant knee degenerative changes are seen.  No gross soft tissue abnormality is noted.  IMPRESSION: No acute abnormalities seen.   Original Report Authenticated By: Alcide Clever, M.D.   Dg Knee Complete 4 Views Left  01/21/2013   *RADIOLOGY REPORT*  Clinical Data: Left knee pain with limited range of motion, recent fall, could not straighten knee completely for exam, hip pain  LEFT KNEE - COMPLETE 4+ VIEW  Comparison: None  Findings: Osseous demineralization. Osteoarthritic changes with tricompartmental joint space narrowing and spur formation greatest at medial compartment. No definite acute fracture, dislocation, or bone destruction. Scattered atherosclerotic  calcifications. No knee joint effusion.  IMPRESSION: Osseous demineralization with osteoarthritic changes left knee. No acute abnormalities.   Original Report Authenticated By: Ulyses Southward, M.D.    MDM   1. Hematoma   2. Anemia   3. Coagulopathy    Working diagnosis at this time is that of a left thigh hemorrhage. She has some ecchymosis distally just above the knee. GEN fall on Friday. She is hypercarbic her she was coagulopathic. She's had a 4 g drop in her hemoglobin over the last few weeks. On repeat exam she continues to have pain. She continues to have excellent perfusion with DP and PT pulses palpable in the foot good feeling and movement of the foot. This point I do not feel she needs further evaluation for a possible compartment syndrome. She is consent for blood transfusion with 2 units of FFP to reverse her coagulopathy. She is in a sinus rhythm. I discussed the case with Dr. Elisabeth Pigeon .  Pt to be admitted, tele bed, team 8.  Claudean Kinds, MD 01/21/13 [id number]

## 2013-01-21 NOTE — Progress Notes (Signed)
First unit of FFP started. No reaction noted.

## 2013-01-21 NOTE — ED Notes (Signed)
Per EMS pt coming from home; had multiple falls in last 2 weeks; fell on Friday c/o left leg and hip pain, however was able to ambulate afterwars; this morning pt unable to get out of bed at all. Per EMS significant swelling noted in left femur area.

## 2013-01-21 NOTE — Progress Notes (Signed)
Patient transferred from the ED. Alert and oriented, C/O left leg pain, noted edema on left knee, patient repositioned. Oriented patient to room/unit; reviewed plan of care with patient. No wound noted but bruises. Bed alarm initiated. Will continue to F/U with plan of care.

## 2013-01-21 NOTE — Care Management Utilization Note (Signed)
UR complete   Zarinah Oviatt,RN,MSN [id number]

## 2013-01-21 NOTE — H&P (Signed)
[facility] History and Physical  [name] ZOX:[id number] DOB: [birth year] DOA: 01/21/2013  Referring physician: ER physician PCP: Rene Paci, MD   Chief Complaint: fall  HPI:  79 year old female with past medical history of atrial fibrillation on anticoagulation with Coumadin, diabetes, hypertension, dementia, anxiety and depression who presented to [facility] ED 01/21/2013 status post fall 3 days prior to this admission. Patient reports not being able to get up this morning for which reason she called EMS. Patient reports having multiple falls in last month or so. She has a slight dementia and is a fair historian. Patient reports no prodromal symptoms prior to fall. No chest pain, shortness of breath or palpitations. No reports of abdominal pain, nausea or vomiting. She reports no loss of consciousness. No fevers or chills. In ED, vital signs showed blood pressure of 92/50, heart rate 64 and T max 98.3 F, oxygen saturation 93% on room air. Patient was found to have left thigh hematoma and her INR was elevated at 4.53. X-ray of the left hip and knee did not reveal acute fractures. CBC revealed leukocytosis of 12.4 and hemoglobin of 9.9. BMP was essentially unremarkable.  Assessment and plan:  Principal Problem:   Fall - no acute fractures identified on x rays of the left hip and knee - needs PT eval for safe discharge plan - may not be a candidate for anticoagulation due to recurrent falls Active Problems:   Hematoma of left thigh - coumadin on hold - continue to observe   Supratherapeutic INR - coumadin on hold   DIABETES MELLITUS, TYPE II, controlled - Continue Amaryl and metformin - A1c in 12/2012 was within normal limits   HTN (hypertension) - hold antihypertensives due to soft BP, 92/50   Leukocytosis - likely reactive - no signs of acute infection   HYPERLIPIDEMIA - Continue statin therapy   Atrial fibrillation - Coumadin on hold due to high INR - Metoprolol on hold  due to low blood pressure   Anemia of chronic disease - Secondary to history of atrial fibrillation - Hemoglobin 9.9 - No indications for transfusion   ANXIETY DEPRESSION - Continue Valium when necessary per home dose - Continue Paxil   Memory loss - Continue memantine   VITAMIN D DEFICIENCY - May supplement vitamin D per home regimen  Radiological Exams on Admission: Dg Chest 1 View 01/21/2013     IMPRESSION: Slight atelectasis at the left lung base.    Dg Hip Complete Left 01/21/2013   * IMPRESSION:  1.  No acute osseous abnormality. Moderate arthritis of both hip joints. 3.  Old fractures of the left ischium.     Dg Femur Left 01/21/2013   * IMPRESSION: No acute abnormalities seen.     Dg Knee Complete 4 Views Left 01/21/2013     IMPRESSION: Osseous demineralization with osteoarthritic changes left knee. No acute abnormalities.      Code Status: Full Family Communication: Pt at bedside Disposition Plan: Admit for further evaluation  Manson Passey, MD  [facility] Pager [phone]  Review of Systems:  Constitutional: Negative for fever, chills and malaise/fatigue. Negative for diaphoresis.  HENT: Negative for hearing loss, ear pain, nosebleeds, congestion, sore throat, neck pain, tinnitus and ear discharge.   Eyes: Negative for blurred vision, double vision, photophobia, pain, discharge and redness.  Respiratory: Negative for cough, hemoptysis, sputum production, shortness of breath, wheezing and stridor.   Cardiovascular: Negative for chest pain, palpitations, orthopnea, claudication and leg swelling.  Gastrointestinal: Negative for nausea, vomiting and  abdominal pain. Negative for heartburn, constipation, blood in stool and melena.  Genitourinary: Negative for dysuria, urgency, frequency, hematuria and flank pain.  Musculoskeletal: Negative for myalgias, back pain, joint pain and positive for falls.  Skin: Negative for itching and rash.  Neurological: Negative for  dizziness and weakness. Negative for tingling, tremors, sensory change, speech change, focal weakness, loss of consciousness and headaches.  Endo/Heme/Allergies: Negative for environmental allergies and polydipsia. Does not bruise/bleed easily.  Psychiatric/Behavioral: Negative for suicidal ideas. The patient is not nervous/anxious.      Past Medical History  Diagnosis Date  . HTN (hypertension)   . Hyperlipidemia   . Diabetes mellitus, type 2   . Paroxysmal atrial fibrillation     chronic anticoag - follows [facility]  . Right bundle branch block and incomplete left bundle branch block     with left axis deviation  . Chronic back pain     narcotic dependence  . Lumbar disc disease     dr. Dutch Quint  . Mitral valve prolapse   . Macular degeneration   . Gastritis     RUT negative, 2005  . Diverticulosis   . Alcohol abuse   . Compression fracture 02/2011    s/p KP  . Arthritis   . Depression   . Myocardial infarction   . COPD (chronic obstructive pulmonary disease)    Past Surgical History  Procedure Laterality Date  . Right ankle surgery    . Knee arthroscopy     Social History:  reports that she has been smoking Cigarettes.  She has a 32.5 pack-year smoking history. She has never used smokeless tobacco. She reports that  drinks alcohol. She reports that she does not use illicit drugs.  Allergies  Allergen Reactions  . Codeine Other (See Comments)    unknown  . Morphine And Related Other (See Comments)    Pt had severe respiratory depression and excessive sedation during a recent hospitalization and was told it was 2/2 morphine.    Family History:  Family History  Problem Relation Age of Onset  . Heart disease Mother   . Cancer Father     throat  . Atrial fibrillation Sister      Prior to Admission medications   Medication Sig Start Date End Date Taking? Authorizing Provider  acetaminophen (TYLENOL) 500 MG tablet Take 1,000 mg by mouth every 6 (six) hours as needed  for pain.   Yes Historical Provider, MD  b complex vitamins tablet Take 1 tablet by mouth daily.   Yes Historical Provider, MD  Cholecalciferol (VITAMIN D) 1000 UNITS capsule Take 1,000 Units by mouth daily.     Yes Historical Provider, MD  diazepam (VALIUM) 5 MG tablet Take 5 mg by mouth every 6 (six) hours as needed for anxiety.   Yes Historical Provider, MD  fentaNYL (DURAGESIC - DOSED MCG/HR) 50 MCG/HR Place 1 patch (50 mcg total) onto the skin every 3 (three) days. 01/09/13  Yes Newt Lukes, MD  glimepiride (AMARYL) 2 MG tablet Take 2 mg by mouth 2 (two) times daily.   Yes Historical Provider, MD  Memantine HCl ER (NAMENDA XR) 14 MG CP24 Take 1 capsule by mouth daily. 09/27/12  Yes Newt Lukes, MD  metFORMIN (GLUCOPHAGE) 500 MG tablet Take 500 mg by mouth 2 (two) times daily with a meal.   Yes Historical Provider, MD  metoprolol tartrate (LOPRESSOR) 25 MG tablet Take 12.5 mg by mouth 2 (two) times daily.   Yes Historical Provider,  MD  oxyCODONE-acetaminophen (PERCOCET) 10-325 MG per tablet Take 1 tablet by mouth every 6 (six) hours as needed for pain. Do not fill more than once every 30 days. 01/09/13  Yes Newt Lukes, MD  PARoxetine (PAXIL) 40 MG tablet Take 40 mg by mouth every morning.   Yes Historical Provider, MD  pravastatin (PRAVACHOL) 80 MG tablet Take 80 mg by mouth daily.   Yes Historical Provider, MD  ramipril (ALTACE) 5 MG capsule Take 5 mg by mouth daily.   Yes Historical Provider, MD  temazepam (RESTORIL) 30 MG capsule Take 30 mg by mouth at bedtime.    Yes Historical Provider, MD  verapamil (CALAN-SR) 180 MG CR tablet Take 180 mg by mouth 2 (two) times daily.   Yes Historical Provider, MD  warfarin (COUMADIN) 5 MG tablet Take 5 mg by mouth daily. 0.5 tab on Sundays and Thursdays; 1 tab daily otherwise   Yes Historical Provider, MD  glucose blood (ONE TOUCH TEST STRIPS) test strip 1 each by Other route as needed. Use as instructed     Historical Provider, MD    Physical Exam: Filed Vitals:   01/21/13 0803  BP: 92/50  Pulse: 64  Temp: 98.3 F (36.8 C)  TempSrc: Oral  Resp: 16  SpO2: 93%    Physical Exam  Constitutional: Appears well-developed and well-nourished. No distress.  HENT: Normocephalic. External right and left ear normal. Oropharynx is clear and moist.  Eyes: Conjunctivae and EOM are normal. PERRLA, no scleral icterus.  Neck: Normal ROM. Neck supple. No JVD. No tracheal deviation. No thyromegaly.  CVS: irregular rhythm, rate controlled, S1/S2 appreciated Pulmonary: Effort and breath sounds normal, no stridor, rhonchi, wheezes, rales.  Abdominal: Soft. BS +,  no distension, tenderness, rebound or guarding.  Musculoskeletal: Normal range of motion. No edema and no tenderness.  Lymphadenopathy: No lymphadenopathy noted, cervical, inguinal. Neuro: Alert. Normal reflexes, muscle tone coordination. No cranial nerve deficit. Skin: Skin is warm and dry. Left thigh hematoma  Labs on Admission:  Basic Metabolic Panel:  Recent Labs Lab 01/21/13 0819  NA 136  K 4.2  CL 102  CO2 26  GLUCOSE 105*  BUN 21  CREATININE 0.57  CALCIUM 8.7   Liver Function Tests: No results found for this basename: AST, ALT, ALKPHOS, BILITOT, PROT, ALBUMIN,  in the last 168 hours No results found for this basename: LIPASE, AMYLASE,  in the last 168 hours No results found for this basename: AMMONIA,  in the last 168 hours CBC:  Recent Labs Lab 01/21/13 0819  WBC 12.4*  NEUTROABS 8.8*  HGB 9.9*  HCT 28.7*  MCV 101.1*  PLT 191   Cardiac Enzymes:  Recent Labs Lab 01/21/13 0819  CKTOTAL 502*   BNP: No components found with this basename: POCBNP,  CBG: No results found for this basename: GLUCAP,  in the last 168 hours  If 7PM-7AM, please contact night-coverage [email or web address] Password [email or web address] 01/21/2013, 11:20 AM

## 2013-01-21 NOTE — Evaluation (Signed)
Physical Therapy Evaluation Patient Details Name: [name] MRN: [id number] DOB: 05-26-1932 Today's Date: 01/21/2013 Time: 7829-5621 PT Time Calculation (min): 23 min  PT Assessment / Plan / Recommendation History of Present Illness  falls at home  Clinical Impression  Pt limited by diffuse pain this pm; only agreeable to EOB; Will recommend SNF at this time unless pt makes excellent progress, then HHPT; Will follow to address deficits below    PT Assessment  Patient needs continued PT services    Follow Up Recommendations  SNF    Does the patient have the potential to tolerate intense rehabilitation      Barriers to Discharge        Equipment Recommendations  Rolling walker with 5" wheels    Recommendations for Other Services     Frequency Min 3X/week    Precautions / Restrictions Precautions Precautions: Fall   Pertinent Vitals/Pain C/o diffuse pain neck, back, LEs; RN notified      Mobility  Bed Mobility Bed Mobility: Supine to Sit;Sit to Supine Supine to Sit: 3: Mod assist Sit to Supine: 3: Mod assist;2: Max assist Details for Bed Mobility Assistance: cues for self assist, increased time Transfers Transfers: Not assessed    Exercises     PT Diagnosis: Difficulty walking;Generalized weakness  PT Problem List: Decreased strength;Decreased activity tolerance;Decreased balance;Decreased mobility;Decreased knowledge of use of DME;Decreased range of motion PT Treatment Interventions: DME instruction;Gait training;Functional mobility training;Therapeutic activities;Therapeutic exercise;Patient/family education;Balance training     PT Goals(Current goals can be found in the care plan section) Acute Rehab PT Goals Patient Stated Goal: decr pain PT Goal Formulation: With patient Time For Goal Achievement: 02/01/13 Potential to Achieve Goals: Fair  Visit Information  Last PT Received On: 01/21/13 Assistance Needed: +2 History of Present Illness: falls at  home       Prior Functioning  Home Living Family/patient expects to be discharged to:: Private residence Living Arrangements: Spouse/significant other Type of Home: House Home Access: Stairs to enter [employer] of Steps: 1 and 1  Home Layout: Two level;Able to live on main level with bedroom/bathroom Home Equipment: Gilmer Mor - quad;Walker - standard Additional Comments: spouse does cooking, cleaning, laundry, etc Prior Function Level of Independence: Independent with assistive device(s) Communication Communication: No difficulties    Cognition  Cognition Arousal/Alertness: Awake/alert Behavior During Therapy: WFL for tasks assessed/performed Overall Cognitive Status: Within Functional Limits for tasks assessed    Extremity/Trunk Assessment Upper Extremity Assessment Upper Extremity Assessment: Generalized weakness Lower Extremity Assessment Lower Extremity Assessment: LLE deficits/detail LLE Deficits / Details: L thigh edematous compared to right and sore to light touch; very firm throughout quads as if ?spasm; pt unable to extend knee in sitting due to pain LLE: Unable to fully assess due to pain Cervical / Trunk Assessment Cervical / Trunk Assessment: Kyphotic   Balance Static Sitting Balance Static Sitting - Balance Support: No upper extremity supported;Feet supported;Feet unsupported Static Sitting - Level of Assistance: 4: Min assist;5: Stand by assistance  End of Session PT - End of Session Activity Tolerance: Patient limited by fatigue;Patient limited by pain Patient left: in bed;with call bell/phone within reach  GP     [facility] 01/21/2013, 3:56 PM

## 2013-01-22 DIAGNOSIS — I4891 Unspecified atrial fibrillation: Secondary | ICD-10-CM

## 2013-01-22 DIAGNOSIS — D638 Anemia in other chronic diseases classified elsewhere: Secondary | ICD-10-CM

## 2013-01-22 DIAGNOSIS — D689 Coagulation defect, unspecified: Secondary | ICD-10-CM

## 2013-01-22 LAB — CBC
HCT: 22.8 % — ABNORMAL LOW (ref 36.0–46.0)
Hemoglobin: 8 g/dL — ABNORMAL LOW (ref 12.0–15.0)
MCH: 34.9 pg — ABNORMAL HIGH (ref 26.0–34.0)
MCHC: 34.4 g/dL (ref 30.0–36.0)
MCV: 101.3 fL — ABNORMAL HIGH (ref 78.0–100.0)
Platelets: 156 10*3/uL (ref 150–400)
RBC: 2.38 MIL/uL — ABNORMAL LOW (ref 3.87–5.11)
RBC: 2.25 MIL/uL — ABNORMAL LOW (ref 3.87–5.11)
RDW: 12.7 % (ref 11.5–15.5)
RDW: 12.8 % (ref 11.5–15.5)
WBC: 10.2 10*3/uL (ref 4.0–10.5)

## 2013-01-22 LAB — URINE CULTURE: Colony Count: NO GROWTH

## 2013-01-22 LAB — TROPONIN I
Troponin I: <0.3 ng/mL (ref <0.30)
Troponin I: <0.3 ng/mL (ref <0.30)

## 2013-01-22 LAB — COMPREHENSIVE METABOLIC PANEL
ALT: 13 U/L (ref 0–35)
AST: 29 U/L (ref 0–37)
Albumin: 3.6 g/dL (ref 3.5–5.2)
Calcium: 8.8 mg/dL (ref 8.4–10.5)
Creatinine, Ser: 0.49 mg/dL — ABNORMAL LOW (ref 0.50–1.10)
GFR calc non Af Amer: >90 mL/min (ref >90)
Sodium: 136 mEq/L (ref 135–145)
Total Protein: 6.2 g/dL (ref 6.0–8.3)

## 2013-01-22 LAB — BASIC METABOLIC PANEL
CO2: 27 mEq/L (ref 19–32)
Calcium: 8.8 mg/dL (ref 8.4–10.5)
Creatinine, Ser: 0.43 mg/dL — ABNORMAL LOW (ref 0.50–1.10)
GFR calc non Af Amer: >90 mL/min (ref >90)
Sodium: 135 mEq/L (ref 135–145)

## 2013-01-22 LAB — GLUCOSE, CAPILLARY: Glucose-Capillary: 132 mg/dL — ABNORMAL HIGH (ref 70–99)

## 2013-01-22 LAB — PREPARE FRESH FROZEN PLASMA: Unit division: 0

## 2013-01-22 LAB — PROTIME-INR: INR: 2.23 — ABNORMAL HIGH (ref 0.00–1.49)

## 2013-01-22 LAB — MAGNESIUM
Magnesium: 1.5 mg/dL (ref 1.5–2.5)
Magnesium: 2.1 mg/dL (ref 1.5–2.5)

## 2013-01-22 MED ORDER — VERAPAMIL HCL ER 180 MG PO TBCR
180.0000 mg | EXTENDED_RELEASE_TABLET | Freq: Two times a day (BID) | ORAL | Status: DC
  Administered 2013-01-22 – 2013-01-25 (×6): 180 mg via ORAL
  Filled 2013-01-22 (×8): qty 1

## 2013-01-22 MED ORDER — METOPROLOL TARTRATE 1 MG/ML IV SOLN: 5.0000 mg | Freq: Once | INTRAVENOUS | Status: DC

## 2013-01-22 MED ORDER — DILTIAZEM HCL 100 MG IV SOLR
5.0000 mg/h | INTRAVENOUS | Status: DC
  Administered 2013-01-22: 10 mg/h via INTRAVENOUS
  Administered 2013-01-22: 5 mg/h via INTRAVENOUS
  Filled 2013-01-22: qty 100

## 2013-01-22 MED ORDER — GLUCERNA SHAKE PO LIQD
237.0000 mL | Freq: Two times a day (BID) | ORAL | Status: DC
  Administered 2013-01-22 – 2013-01-25 (×4): 237 mL via ORAL
  Filled 2013-01-22 (×7): qty 237

## 2013-01-22 MED ORDER — DILTIAZEM LOAD VIA INFUSION
10.0000 mg | Freq: Once | INTRAVENOUS | Status: AC
  Filled 2013-01-22: qty 10

## 2013-01-22 MED ORDER — INSULIN ASPART 100 UNIT/ML ~~LOC~~ SOLN
0.0000 [IU] | Freq: Three times a day (TID) | SUBCUTANEOUS | Status: DC
  Administered 2013-01-22 (×2): 1 [IU] via SUBCUTANEOUS
  Administered 2013-01-22: 2 [IU] via SUBCUTANEOUS
  Administered 2013-01-24: 1 [IU] via SUBCUTANEOUS

## 2013-01-22 MED ORDER — RAMIPRIL 5 MG PO CAPS
5.0000 mg | ORAL_CAPSULE | Freq: Every day | ORAL | Status: DC
  Administered 2013-01-22 – 2013-01-25 (×4): 5 mg via ORAL
  Filled 2013-01-22 (×4): qty 1

## 2013-01-22 MED ORDER — POTASSIUM CHLORIDE CRYS ER 20 MEQ PO TBCR
40.0000 meq | EXTENDED_RELEASE_TABLET | Freq: Two times a day (BID) | ORAL | Status: AC
  Administered 2013-01-22 (×2): 40 meq via ORAL
  Filled 2013-01-22 (×2): qty 2

## 2013-01-22 MED ORDER — MAGNESIUM SULFATE 40 MG/ML IJ SOLN
2.0000 g | Freq: Once | INTRAMUSCULAR | Status: AC
  Administered 2013-01-22: 2 g via INTRAVENOUS
  Filled 2013-01-22: qty 50

## 2013-01-22 MED ORDER — ADULT MULTIVITAMIN W/MINERALS CH
1.0000 | ORAL_TABLET | Freq: Every day | ORAL | Status: DC
  Administered 2013-01-22 – 2013-01-25 (×4): 1 via ORAL
  Filled 2013-01-22 (×4): qty 1

## 2013-01-22 MED ORDER — INSULIN ASPART 100 UNIT/ML ~~LOC~~ SOLN: 0.0000 [IU] | Freq: Every day | SUBCUTANEOUS | Status: DC

## 2013-01-22 MED ORDER — METOPROLOL TARTRATE 12.5 MG HALF TABLET
12.5000 mg | ORAL_TABLET | Freq: Two times a day (BID) | ORAL | Status: DC
  Administered 2013-01-22 – 2013-01-25 (×6): 12.5 mg via ORAL
  Filled 2013-01-22 (×7): qty 1

## 2013-01-22 MED ORDER — ATORVASTATIN CALCIUM 20 MG PO TABS
20.0000 mg | ORAL_TABLET | Freq: Every day | ORAL | Status: DC
  Administered 2013-01-22 – 2013-01-24 (×2): 20 mg via ORAL
  Filled 2013-01-22 (×4): qty 1

## 2013-01-22 MED ORDER — METOPROLOL TARTRATE 1 MG/ML IV SOLN
5.0000 mg | Freq: Once | INTRAVENOUS | Status: AC
  Administered 2013-01-22: 5 mg via INTRAVENOUS
  Filled 2013-01-22: qty 5

## 2013-01-22 MED ORDER — METOPROLOL TARTRATE 12.5 MG HALF TABLET
12.5000 mg | ORAL_TABLET | Freq: Two times a day (BID) | ORAL | Status: DC
  Administered 2013-01-22: 12.5 mg via ORAL
  Filled 2013-01-22 (×3): qty 1

## 2013-01-22 NOTE — Consult Note (Signed)
CONSULT NOTE  Date: 01/22/2013               Patient Name:  [name] MRN: [id number]  DOB: Aug 28, 1932 Age / Sex: 79 y.o., female        PCP: Rene Paci Primary Cardiologist: Berton Mount, MD            Referring Physician: Manson Passey, MD              Reason for Consult: Atrial fib, frequent falls           History of Present Illness: Patient is a 79 y.o. female with a PMHx of paroxysmal atrial fibrillation, DM, dementia  who was admitted to [facility] on 01/21/2013 for evaluation following a fall.   She is pleasantly demented.  She was not able to remember her doctors.  She had no complaints.   She had  atrial fib and was started on IV diltiazem.  She converted to NSR this am and the dilt drip was DC'd.  She received   PO metoprolol and verapamil this am.   Medications: Outpatient medications: Prescriptions prior to admission  Medication Sig Dispense Refill  . acetaminophen (TYLENOL) 500 MG tablet Take 1,000 mg by mouth every 6 (six) hours as needed for pain.      Marland Kitchen b complex vitamins tablet Take 1 tablet by mouth daily.      . Cholecalciferol (VITAMIN D) 1000 UNITS capsule Take 1,000 Units by mouth daily.        . diazepam (VALIUM) 5 MG tablet Take 5 mg by mouth every 6 (six) hours as needed for anxiety.      . fentaNYL (DURAGESIC - DOSED MCG/HR) 50 MCG/HR Place 1 patch (50 mcg total) onto the skin every 3 (three) days.  10 patch  0  . glimepiride (AMARYL) 2 MG tablet Take 2 mg by mouth 2 (two) times daily.      . Memantine HCl ER (NAMENDA XR) 14 MG CP24 Take 1 capsule by mouth daily.  30 capsule  5  . metFORMIN (GLUCOPHAGE) 500 MG tablet Take 500 mg by mouth 2 (two) times daily with a meal.      . metoprolol tartrate (LOPRESSOR) 25 MG tablet Take 12.5 mg by mouth 2 (two) times daily.      Marland Kitchen oxyCODONE-acetaminophen (PERCOCET) 10-325 MG per tablet Take 1 tablet by mouth every 6 (six) hours as needed for pain. Do not fill more than once every 30 days.  120 tablet  0  .  PARoxetine (PAXIL) 40 MG tablet Take 40 mg by mouth every morning.      . pravastatin (PRAVACHOL) 80 MG tablet Take 80 mg by mouth daily.      . ramipril (ALTACE) 5 MG capsule Take 5 mg by mouth daily.      . temazepam (RESTORIL) 30 MG capsule Take 30 mg by mouth at bedtime.       . verapamil (CALAN-SR) 180 MG CR tablet Take 180 mg by mouth 2 (two) times daily.      Marland Kitchen warfarin (COUMADIN) 5 MG tablet Take 5 mg by mouth daily. 0.5 tab on Sundays and Thursdays; 1 tab daily otherwise      . glucose blood (ONE TOUCH TEST STRIPS) test strip 1 each by Other route as needed. Use as instructed         Current medications: Current Facility-Administered Medications  Medication Dose Route Frequency Provider Last Rate Last Dose  . 0.9 %  sodium  chloride infusion   Intravenous Continuous Alison Murray, MD 75 mL/hr at 01/22/13 0046    . acetaminophen (TYLENOL) tablet 650 mg  650 mg Oral Q6H PRN Alison Murray, MD       Or  . acetaminophen (TYLENOL) suppository 650 mg  650 mg Rectal Q6H PRN Alison Murray, MD      . atorvastatin (LIPITOR) tablet 20 mg  20 mg Oral q1800 Alison Murray, MD      . cholecalciferol (VITAMIN D) tablet 1,000 Units  1,000 Units Oral Daily Alison Murray, MD   1,000 Units at 01/22/13 1029  . diazepam (VALIUM) tablet 5 mg  5 mg Oral Q6H PRN Alison Murray, MD   5 mg at 01/22/13 1029  . feeding supplement (GLUCERNA SHAKE) liquid 237 mL  237 mL Oral BID BM Reanne Vista Lawman, RD      . fentaNYL (SUBLIMAZE) injection 25 mcg  25 mcg Intravenous Q2H PRN Alison Murray, MD      . folic acid-pyridoxine-cyancobalamin (FOLTX) 2.5-25-2 MG per tablet 1 tablet  1 tablet Oral Daily Alison Murray, MD   1 tablet at 01/22/13 1029  . insulin aspart (novoLOG) injection 0-5 Units  0-5 Units Subcutaneous QHS Russella Dar, NP      . insulin aspart (novoLOG) injection 0-9 Units  0-9 Units Subcutaneous TID WC Russella Dar, NP      . memantine [facility]) tablet 5 mg  5 mg Oral BID Alison Murray, MD   5 mg at  01/22/13 1029  . metoprolol tartrate (LOPRESSOR) tablet 12.5 mg  12.5 mg Oral BID Alison Murray, MD      . multivitamin with minerals tablet 1 tablet  1 tablet Oral Daily Reanne Vista Lawman, RD      . ondansetron [facility]) injection 4 mg  4 mg Intravenous Once Claudean Kinds, MD      . ondansetron [facility]) tablet 4 mg  4 mg Oral Q6H PRN Alison Murray, MD       Or  . ondansetron [facility]) injection 4 mg  4 mg Intravenous Q6H PRN Alison Murray, MD      . oxyCODONE-acetaminophen (PERCOCET/ROXICET) 5-325 MG per tablet 1 tablet  1 tablet Oral Q6H PRN Alison Murray, MD   1 tablet at 01/22/13 1044   And  . oxyCODONE (Oxy IR/ROXICODONE) immediate release tablet 5 mg  5 mg Oral Q6H PRN Alison Murray, MD   5 mg at 01/22/13 1044  . PARoxetine (PAXIL) tablet 40 mg  40 mg Oral Daily Alison Murray, MD   40 mg at 01/22/13 1029  . potassium chloride SA (K-DUR,KLOR-CON) CR tablet 40 mEq  40 mEq Oral BID Russella Dar, NP   40 mEq at 01/22/13 0452  . ramipril (ALTACE) capsule 5 mg  5 mg Oral Daily Alison Murray, MD   5 mg at 01/22/13 1155  . sodium chloride 0.9 % injection 3 mL  3 mL Intravenous Q12H Alison Murray, MD   3 mL at 01/22/13 1030  . temazepam (RESTORIL) capsule 30 mg  30 mg Oral QHS Alison Murray, MD   30 mg at 01/21/13 2112  . verapamil (CALAN-SR) CR tablet 180 mg  180 mg Oral BID Alison Murray, MD   180 mg at 01/22/13 1143     Allergies  Allergen Reactions  . Codeine Other (See Comments)    unknown  . Morphine And Related Other (  See Comments)    Pt had severe respiratory depression and excessive sedation during a recent hospitalization and was told it was 2/2 morphine.     Past Medical History  Diagnosis Date  . HTN (hypertension)   . Hyperlipidemia   . Diabetes mellitus, type 2   . Paroxysmal atrial fibrillation     chronic anticoag - follows [facility]  . Right bundle branch block and incomplete left bundle branch block     with left axis deviation  . Chronic back pain     narcotic  dependence  . Lumbar disc disease     dr. Dutch Quint  . Mitral valve prolapse   . Macular degeneration   . Gastritis     RUT negative, 2005  . Diverticulosis   . Alcohol abuse   . Compression fracture 02/2011    s/p KP  . Arthritis   . Depression   . Myocardial infarction   . COPD (chronic obstructive pulmonary disease)     Past Surgical History  Procedure Laterality Date  . Right ankle surgery    . Knee arthroscopy      Family History  Problem Relation Age of Onset  . Heart disease Mother   . Cancer Father     throat  . Atrial fibrillation Sister     Social History:  reports that she has been smoking Cigarettes.  She has a 32.5 pack-year smoking history. She has never used smokeless tobacco. She reports that  drinks alcohol. She reports that she does not use illicit drugs.   Review of Systems: The patient was pleasantly demented and could not provide me with ROS  Physical Exam: BP 101/39  Pulse 77  Temp(Src) 98.5 F (36.9 C) (Oral)  Resp 25  Ht 5\' 5"  (1.651 m)  Wt 105 lb 3.2 oz (47.718 kg)  BMI 17.51 kg/m2  SpO2 92%  General: Vital signs reviewed and noted. Frail , elderly female, sleeping, slumped over in bed  Head: Normocephalic,   sclera anicteric, mucus membranes are moist  Neck: Supple. Negative for carotid bruits. JVD not elevated.  Lungs:  Clear bilaterally to auscultation without wheezes, rales, or rhonchi. Breathing is unlabored.  Heart: Regular rate  Abdomen:  Soft, non-tender, non-distended with normoactive bowel sounds. No hepatomegaly. No rebound/guarding. No obvious abdominal masses  MSK: Strength and the appear normal for age.  Extremities: Large firm mass ( likely a hematoma) in her left thigh, firm , tender to touch  Neurologic: Sleepy, pleasant, could not answer questions very well  Psych: .    Lab results: Basic Metabolic Panel:  Recent Labs Lab 01/21/13 0819 01/21/13 1324 01/22/13 0347 01/22/13 1105  NA 136 136 136 135  K 4.2 4.2  3.5 3.7  CL 102 101 99 99  CO2 26 29 27 27   GLUCOSE 105* 92 81 115*  BUN 21 22 11 9   CREATININE 0.57 0.61 0.49* 0.43*  CALCIUM 8.7 8.9 8.8 8.8  MG  --  1.7 1.5 2.1  PHOS  --  4.5  --   --     Liver Function Tests:  Recent Labs Lab 01/21/13 1324 01/22/13 0347  AST 25 29  ALT 13 13  ALKPHOS 43 43  BILITOT 0.5 0.6  PROT 5.8* 6.2  ALBUMIN 3.6 3.6   No results found for this basename: LIPASE, AMYLASE,  in the last 168 hours No results found for this basename: AMMONIA,  in the last 168 hours  CBC:  Recent Labs Lab 01/21/13 [phone]  01/21/13 1324 01/22/13 0347 01/22/13 1105  WBC 12.4* 10.5 11.0* 10.2  NEUTROABS 8.8* 7.3  --   --   HGB 9.9* 8.8* 8.3* 8.0*  HCT 28.7* 25.9* 24.1* 22.8*  MCV 101.1* 101.6* 101.3* 101.3*  PLT 191 159 156 154    Cardiac Enzymes:  Recent Labs Lab 01/21/13 0819 01/22/13 0347 01/22/13 1105  CKTOTAL 502*  --   --   TROPONINI  --  <0.30 <0.30    BNP: No components found with this basename: POCBNP,   CBG:  Recent Labs Lab 01/21/13 1657 01/22/13 0843 01/22/13 1204  GLUCAP 130* 172* 132*    Coagulation Studies:  Recent Labs  01/21/13 0819 01/21/13 1324 01/22/13 0347  LABPROT 41.2* 42.9* 24.0*  INR 4.53* 4.78* 2.23*     Other results:  Tele:  NSR   Imaging: Dg Chest 1 View  01/21/2013   *RADIOLOGY REPORT*  Clinical Data: Hip pain secondary to recent falls.  CHEST - 1 VIEW  Comparison: Chest x-ray dated 05/03/2012  Findings: Heart size and pulmonary vascularity are normal.  Slight linear atelectasis at the left lung base.  Chronic peribronchial thickening.  Old compression fracture of the mid thoracic spine treated with vertebroplasty.  No acute osseous abnormality.  IMPRESSION: Slight atelectasis at the left lung base.   Original Report Authenticated By: Francene Boyers, M.D.   Dg Hip Complete Left  01/21/2013   *RADIOLOGY REPORT*  Clinical Data: Left hip pain.  Low back pain.  Recent falls.  LEFT HIP - COMPLETE 2+ VIEW   Comparison: Radiographs and CT scan dated 05/15/2012  Findings: There is abnormal sclerosis as well as inhomogeneous lucency in both femoral heads consistent with moderate osteoarthritis as visualized on the prior CT scan.  There is no acute fracture or dislocation.  There is an old deformity of the left side of the symphysis and of the left superior and inferior pubic rami consistent with old healed fractures.  There is a rotoscoliosis of the lumbar spine with degenerative disc and joint disease.  IMPRESSION:  1.  No acute osseous abnormality. Moderate arthritis of both hip joints. 3.  Old fractures of the left ischium.   Original Report Authenticated By: Francene Boyers, M.D.   Dg Femur Left  01/21/2013   *RADIOLOGY REPORT*  Clinical Data: Left thigh pain  LEFT FEMUR - 2 VIEW  Comparison: None.  Findings: No acute fracture or dislocation is noted.  Significant knee degenerative changes are seen.  No gross soft tissue abnormality is noted.  IMPRESSION: No acute abnormalities seen.   Original Report Authenticated By: Alcide Clever, M.D.   Dg Knee Complete 4 Views Left  01/21/2013   *RADIOLOGY REPORT*  Clinical Data: Left knee pain with limited range of motion, recent fall, could not straighten knee completely for exam, hip pain  LEFT KNEE - COMPLETE 4+ VIEW  Comparison: None  Findings: Osseous demineralization. Osteoarthritic changes with tricompartmental joint space narrowing and spur formation greatest at medial compartment. No definite acute fracture, dislocation, or bone destruction. Scattered atherosclerotic calcifications. No knee joint effusion.  IMPRESSION: Osseous demineralization with osteoarthritic changes left knee. No acute abnormalities.   Original Report Authenticated By: Ulyses Southward, M.D.       Assessment & Plan:  1. Paroxysmal atrial fib:  She has had several falls over the past 2 weeks.  At this point, I would recommend stopping her anticoagulation.  It is too risky to have on  anticoagulation.  Continue metoprolol and verapamil for atrial fib.  At this point, I think our focus should be on patient safety.    Cardiac enzymes are negative.  2. Anemia:  She has a large hematoma in her left leg.    Vesta Mixer, Montez Hageman., MD, [facility] 01/22/2013, 12:59 PM

## 2013-01-22 NOTE — Plan of Care (Cosign Needed)
Noted with tachycardia- underlying atrial fib (has baseline LBBB per admit EKG)- reviewed home meds and CCB and BB were held due to low BP- tachycardic rates seem reflective of BB withdrawal so will resume home dose since SBP 125. More concerning is trend downward in Hgb in setting of recent fall and supratherapeutic INR- AM labs pending/to be drawn around 5 am-tachycardia may also indicate sx anemia. Baseline hgb appears to be 10.7- presented with hgb 13 c/w volume depletion. Has received 2 units FFP which has likely helped volume status and BP.  Junious Silk, ANP

## 2013-01-22 NOTE — Plan of Care (Cosign Needed)
Now transferred to SDU. Attending MD Onalee Hua has also arrived to bedside. RVR persists despite IV Lopressor. Pt denies CP or awareness of palpitations but she is complaining of leg cramps. Will proceed with Cardizem drip and bolus. Will also cycle cardiac enzymes but expect with RVR they would likely be elevated. She is neurologically intact. Lungs CTA bilaterally-sats 95-97% on RA. Abdomen is soft and non tender. No CVAT. Will notify spouse of change in condition.  UPDATE: 435 am- K 3.5 and Mag 1.5 so will replete both. If HR doesn't improve soon consider transfuse PRBC x1 unit since has now drifted down to 8.3. Attending MD Onalee Hua aware. INR has decreased to 2.23 after FFP. TSH 1.090. Initial TNI normal.  Junious Silk, ANP

## 2013-01-22 NOTE — Progress Notes (Signed)
OT Cancellation Note  Patient Details Name: [name] MRN: [id number] DOB: 12-16-1932   Cancelled Treatment:    Reason Eval/Treat Not Completed: Medical issues which prohibited therapy.  Noted pt transferred to SDU with RVR.  Will check back tomorrow.    Katryn Plummer 01/22/2013, 12:15 PM Marica Otter, OTR/L [phone] 01/22/2013

## 2013-01-22 NOTE — Progress Notes (Signed)
Second notification sent to [facility] regarding patient's status.

## 2013-01-22 NOTE — Progress Notes (Signed)
INITIAL NUTRITION ASSESSMENT  DOCUMENTATION CODES Per approved criteria  -Underweight   INTERVENTION: Provide Glucerna Shakes BID Encourage PO intake Provide Multivitamin with minerals daily  NUTRITION DIAGNOSIS: Increased nutrient needs related to underweight as evidenced by BMI of 17.5.   Goal: Pt to meet >/= 90% of their estimated nutrition needs   Monitor:  PO intake Weight Labs  Reason for Assessment: Malnutrition Screening Tool, score of 3  79 y.o. female  Admitting Dx: Fall  ASSESSMENT: 79 year old female with past medical history of atrial fibrillation on anticoagulation with Coumadin, diabetes, hypertension, dementia, anxiety and depression who presented to [facility] ED 01/21/2013 status post fall 3 days prior to this admission. Patient reports not being able to get up in the morning for which reason she called EMS. Patient reports having multiple falls in last month or so. She has a slight dementia and is a fair historian.  Pt reports that she used to weigh 116 lbs about a year ago but, has lost weight over the past year due to decreased appetite and personal life difficulties. She reports eating less than usual since admitted to [facility]; per nursing notes pt is eating about 50% of meals. She states she has never been a Producer, television/film/video. Encouraged pt to continue eating 3 meals daily and incorporate a nutritional supplement into her diet to help her gain a little weight. Will send pt Glucerna Shakes to try.   Height: Ht Readings from Last 1 Encounters:  01/21/13 5\' 5"  (1.651 m)    Weight: Wt Readings from Last 1 Encounters:  01/21/13 105 lb 3.2 oz (47.718 kg)    Ideal Body Weight: 125 lbs  % Ideal Body Weight: 84%  Wt Readings from Last 10 Encounters:  01/21/13 105 lb 3.2 oz (47.718 kg)  09/27/12 116 lb (52.617 kg)  06/08/12 108 lb 12.8 oz (49.351 kg)  05/23/12 110 lb 12.8 oz (50.259 kg)  05/15/12 107 lb (48.535 kg)  05/03/12 116 lb (52.617 kg)  03/21/12 116 lb (52.617 kg)   01/27/12 108 lb (48.988 kg)  11/16/11 112 lb 0.6 oz (50.821 kg)  05/27/11 123 lb 12.8 oz (56.155 kg)    Usual Body Weight: 116 lbs  % Usual Body Weight: 91%  BMI:  Body mass index is 17.51 kg/(m^2).  Estimated Nutritional Needs: Kcal: 1400-1600 Protein: 60-70 grams Fluid: >/=1.4 L/day  Skin: non-pitting LLE edema; intact  Diet Order: Carb Control  EDUCATION NEEDS: -No education needs identified at this time   Intake/Output Summary (Last 24 hours) at 01/22/13 1124 Last data filed at 01/22/13 0800  Gross per 24 hour  Intake 2148.92 ml  Output   2600 ml  Net -451.08 ml    Last BM: 9/6  Labs:   Recent Labs Lab 01/21/13 0819 01/21/13 1324 01/22/13 0347  NA 136 136 136  K 4.2 4.2 3.5  CL 102 101 99  CO2 26 29 27   BUN 21 22 11   CREATININE 0.57 0.61 0.49*  CALCIUM 8.7 8.9 8.8  MG  --  1.7 1.5  PHOS  --  4.5  --   GLUCOSE 105* 92 81    CBG (last 3)   Recent Labs  01/21/13 1657 01/22/13 0843  GLUCAP 130* 172*    Scheduled Meds: . atorvastatin  20 mg Oral q1800  . cholecalciferol  1,000 Units Oral Daily  . folic acid-pyridoxine-cyancobalamin  1 tablet Oral Daily  . insulin aspart  0-5 Units Subcutaneous QHS  . insulin aspart  0-9 Units Subcutaneous TID WC  .  memantine  5 mg Oral BID  . metoprolol tartrate  12.5 mg Oral BID  . ondansetron (ZOFRAN) IV  4 mg Intravenous Once  . PARoxetine  40 mg Oral Daily  . potassium chloride  40 mEq Oral BID  . ramipril  5 mg Oral Daily  . sodium chloride  3 mL Intravenous Q12H  . temazepam  30 mg Oral QHS  . verapamil  180 mg Oral BID    Continuous Infusions: . sodium chloride 75 mL/hr at 01/22/13 0046    Past Medical History  Diagnosis Date  . HTN (hypertension)   . Hyperlipidemia   . Diabetes mellitus, type 2   . Paroxysmal atrial fibrillation     chronic anticoag - follows [facility]  . Right bundle branch block and incomplete left bundle branch block     with left axis deviation  . Chronic back pain      narcotic dependence  . Lumbar disc disease     dr. Dutch Quint  . Mitral valve prolapse   . Macular degeneration   . Gastritis     RUT negative, 2005  . Diverticulosis   . Alcohol abuse   . Compression fracture 02/2011    s/p KP  . Arthritis   . Depression   . Myocardial infarction   . COPD (chronic obstructive pulmonary disease)     Past Surgical History  Procedure Laterality Date  . Right ankle surgery    . Knee arthroscopy      Ian Malkin RD, LDN Inpatient Clinical Dietitian Pager: [phone] After Hours Pager: [phone]

## 2013-01-22 NOTE — Progress Notes (Signed)
Pt's husband demanding in a loud voice and pointing his finger that the  tech open up computer for him to see; he is also loudly informing me and Dr.Devine that all her medications need to be given as they are at home. Dr. Elisabeth Pigeon provided update to husband and explained the reasoning for why she is receiving current meds.[name]

## 2013-01-22 NOTE — Progress Notes (Signed)
[facility] PROGRESS NOTE  Samira Acero AVW:[id number] DOB: 1932-11-02 DOA: 01/21/2013 PCP: Rene Paci, MD  Brief narrative: 79 year old female with past medical history of atrial fibrillation on anticoagulation with Coumadin, diabetes, hypertension, dementia, anxiety and depression who presented to [facility] ED 01/21/2013 status post fall 3 days prior to this admission. Patient reports not being able to get up this morning for which reason she called EMS. Patient reports having multiple falls in last month or so. She has a slight dementia and is a fair historian. Patient reports no prodromal symptoms prior to fall. No chest pain, shortness of breath or palpitations. No reports of abdominal pain, nausea or vomiting. She reports no loss of consciousness. No fevers or chills.  In ED, vital signs showed blood pressure of 92/50, heart rate 64 and T max 98.3 F, oxygen saturation 93% on room air. Patient was found to have left thigh hematoma and her INR was elevated at 4.53. X-ray of the left hip and knee did not reveal acute fractures. CBC revealed leukocytosis of 12.4 and hemoglobin of 9.9. BMP was essentially unremarkable. Overnight, pt developed A fib with RVR (has h/o A fib) but home meds were not started due to low BP. Pt required Cardizem drip and now can be transitioned to PO home meds as SBP in 120's.  Assessment and plan:   Principal Problem:  Fall  - no acute fractures identified on x rays of the left hip and knee; left knee swelling improving - needs PT eval for safe discharge plan - evaluation pending - may not be a candidate for anticoagulation due to recurrent falls  Active Problems:  Atrial fibrillation  - Coumadin on hold due to high INR; INR reversed to 2.23 with FFP - needed Cardizem drip overnight but will d/c this drip and restart home meds - Metoprolol and verapamil were on hold since admission due to low BP Hematoma of left thigh  - coumadin on hold  - continue to observe   Supratherapeutic INR  - coumadin on hold; will follow up with cardio in regards to recommendations for continuing [facility] DIABETES MELLITUS, TYPE II, controlled  - Continue Amaryl and metformin  - A1c in 12/2012 was within normal limits  HTN (hypertension)  - SBP in 120's so will restart home meds Leukocytosis  - likely reactive  - no signs of acute infection  - WBC count now WNL HYPERLIPIDEMIA  - Continue statin therapy  Anemia of chronic disease  - Secondary to history of atrial fibrillation  - Hemoglobin 9.9 on admission and now 8 - No indications for transfusion  - continue to monitor CBC and transfuse if less than 8 ANXIETY DEPRESSION  - Continue Valium when necessary per home dose  - Continue Paxil  Memory loss  - Continue memantine  VITAMIN D DEFICIENCY  - May supplement vitamin D per home regimen   Code Status: Full  Family Communication: Pt husband at bedside  Disposition Plan: home when stable  Manson Passey, MD  [facility] [phone]  If 7PM-7AM, please contact night-coverage [email or web address] Password [email or web address] 01/22/2013, 7:04 AM   LOS: 1 day    HPI/Subjective: Went into A fib with RVR during the night and required SDU transfer.  Objective: Filed Vitals:   01/22/13 0424 01/22/13 0430 01/22/13 0500 01/22/13 0600  BP: 120/52 122/59 132/52 127/52  Pulse: 101 103 96 95  Temp:      TempSrc:      Resp: 19 19 18 20   Height:  Weight:      SpO2: 95% 95% 98% 99%    Intake/Output Summary (Last 24 hours) at 01/22/13 0704 Last data filed at 01/22/13 0600  Gross per 24 hour  Intake 1748.92 ml  Output   2050 ml  Net -301.08 ml    Exam:   General:  Pt is alert, follows commands appropriately, not in acute distress  Cardiovascular: Regular rhythm, tachycardic, S1/S2 appreciated  Respiratory: Clear to auscultation bilaterally, no wheezing, no crackles, no rhonchi  Abdomen: Soft, non tender, non distended, bowel sounds present, no  guarding  Extremities: left knee swollen but improving since yesterday, pulses DP and PT palpable bilaterally  Neuro: Grossly nonfocal  Data Reviewed: Basic Metabolic Panel:  Recent Labs Lab 01/21/13 0819 01/21/13 1324 01/22/13 0347  NA 136 136 136  K 4.2 4.2 3.5  CL 102 101 99  CO2 26 29 27   GLUCOSE 105* 92 81  BUN 21 22 11   CREATININE 0.57 0.61 0.49*  CALCIUM 8.7 8.9 8.8  MG  --  1.7 1.5  PHOS  --  4.5  --    Liver Function Tests:  Recent Labs Lab 01/21/13 1324 01/22/13 0347  AST 25 29  ALT 13 13  ALKPHOS 43 43  BILITOT 0.5 0.6  PROT 5.8* 6.2  ALBUMIN 3.6 3.6   No results found for this basename: LIPASE, AMYLASE,  in the last 168 hours No results found for this basename: AMMONIA,  in the last 168 hours CBC:  Recent Labs Lab 01/21/13 0819 01/21/13 1324 01/22/13 0347  WBC 12.4* 10.5 11.0*  NEUTROABS 8.8* 7.3  --   HGB 9.9* 8.8* 8.3*  HCT 28.7* 25.9* 24.1*  MCV 101.1* 101.6* 101.3*  PLT 191 159 156   Cardiac Enzymes:  Recent Labs Lab 01/21/13 0819 01/22/13 0347  CKTOTAL 502*  --   TROPONINI  --  <0.30   BNP: No components found with this basename: POCBNP,  CBG:  Recent Labs Lab 01/21/13 1657  GLUCAP 130*    No results found for this or any previous visit (from the past 240 hour(s)).   Studies: Dg Chest 1 View  01/21/2013   *RADIOLOGY REPORT*  Clinical Data: Hip pain secondary to recent falls.  CHEST - 1 VIEW  Comparison: Chest x-ray dated 05/03/2012  Findings: Heart size and pulmonary vascularity are normal.  Slight linear atelectasis at the left lung base.  Chronic peribronchial thickening.  Old compression fracture of the mid thoracic spine treated with vertebroplasty.  No acute osseous abnormality.  IMPRESSION: Slight atelectasis at the left lung base.   Original Report Authenticated By: Francene Boyers, M.D.   Dg Hip Complete Left  01/21/2013   *RADIOLOGY REPORT*  Clinical Data: Left hip pain.  Low back pain.  Recent falls.  LEFT HIP -  COMPLETE 2+ VIEW  Comparison: Radiographs and CT scan dated 05/15/2012  Findings: There is abnormal sclerosis as well as inhomogeneous lucency in both femoral heads consistent with moderate osteoarthritis as visualized on the prior CT scan.  There is no acute fracture or dislocation.  There is an old deformity of the left side of the symphysis and of the left superior and inferior pubic rami consistent with old healed fractures.  There is a rotoscoliosis of the lumbar spine with degenerative disc and joint disease.  IMPRESSION:  1.  No acute osseous abnormality. Moderate arthritis of both hip joints. 3.  Old fractures of the left ischium.   Original Report Authenticated By: Francene Boyers, M.D.  Dg Femur Left  01/21/2013   *RADIOLOGY REPORT*  Clinical Data: Left thigh pain  LEFT FEMUR - 2 VIEW  Comparison: None.  Findings: No acute fracture or dislocation is noted.  Significant knee degenerative changes are seen.  No gross soft tissue abnormality is noted.  IMPRESSION: No acute abnormalities seen.   Original Report Authenticated By: Alcide Clever, M.D.   Dg Knee Complete 4 Views Left  01/21/2013   *RADIOLOGY REPORT*  Clinical Data: Left knee pain with limited range of motion, recent fall, could not straighten knee completely for exam, hip pain  LEFT KNEE - COMPLETE 4+ VIEW  Comparison: None  Findings: Osseous demineralization. Osteoarthritic changes with tricompartmental joint space narrowing and spur formation greatest at medial compartment. No definite acute fracture, dislocation, or bone destruction. Scattered atherosclerotic calcifications. No knee joint effusion.  IMPRESSION: Osseous demineralization with osteoarthritic changes left knee. No acute abnormalities.   Original Report Authenticated By: Ulyses Southward, M.D.    Scheduled Meds: . cholecalciferol  1,000 Units Oral Daily  . folic acid-pyridoxine-cyancobalamin  1 tablet Oral Daily  . insulin aspart  0-5 Units Subcutaneous QHS  . insulin aspart  0-9  Units Subcutaneous TID WC  . memantine  5 mg Oral BID  . metoprolol tartrate  12.5 mg Oral BID  . ondansetron (ZOFRAN) IV  4 mg Intravenous Once  . PARoxetine  40 mg Oral Daily  . potassium chloride  40 mEq Oral BID  . simvastatin  40 mg Oral q1800  . sodium chloride  3 mL Intravenous Q12H  . temazepam  30 mg Oral QHS   Continuous Infusions: . sodium chloride 75 mL/hr at 01/22/13 0046  . diltiazem (CARDIZEM) infusion 5 mg/hr (01/22/13 0600)

## 2013-01-22 NOTE — Progress Notes (Signed)
Upon follow up assessment of patient she was having difficulty verbally expressing herself and becoming visibly upset "word salad" and increased urinary output via foley catheter. At that time she was showing v-tach on the telemetry monitor. Rapid Response was called to assist in further assessing the patient and on call attending was also made aware of change in patient status. Patient was consequently transferred to ICU stepdown. Patient's spouse to be notified by attending NP.

## 2013-01-22 NOTE — Plan of Care (Cosign Needed)
RN called back- had called rapid response to bedside due to recurrent tachycardia with rates as high as 160's. SBP 130's-informed pt needs to transfer to SDU due to amount of nursing care required. Ordered for 1x dose IV Lopressor to be given and transfer pt to SDU. Also asked for am labs to be drawn STAT now due to concerns that RVR indicative of sx anemia.  Junious Silk, ANP

## 2013-01-22 NOTE — Progress Notes (Signed)
The patient had copious alarms sounding for the telemetry. RN checked on the monitor screen which showed possible A.Fib.- this was a change from her baseline EKG strip earlier tonight. A 12 lead EKG was performed and the EKG read Sinus Tachycardia with Premature Supraventricular Complexes. Central Monitoring was notified of the 12 lead EKG. The PCP on call was notified and awaiting any new orders.

## 2013-01-22 NOTE — Progress Notes (Signed)
Pt moved to 1230 due to pipe work overhead in 1231 through 1233. Messages left for Joe at [phone] and [phone] stating who I was, the new room number and the reason for the transfer.

## 2013-01-22 NOTE — Progress Notes (Signed)
Received A Rapid Response call from RN carring for Pt at about 0310.  When this RN arrived at Pt room, Pt was in bed, alert but lethargic, c/o of not feeling well and dizziness, Pt placed on Rapid Response monitor, HR trending between 114 to 180s, BP 133/85, RR 20. [facility] floor coverage paged, page returned and new orders received which includes transfer to  [facility]. Pt's family notified of the transfer.

## 2013-01-22 NOTE — Progress Notes (Addendum)
Husband called to talk with [name]. She is very confused at present time. He stated he would like to talk with a Child psychotherapist as soon as possible. The reason is because she has alzheimers and is confused and can't remember anything. He stated he did not want her to smoke or drink anymore. He is leaving town early Friday am and will return Saturday pm. He requests her to be placed in a Nursing Home because he just cannot handle her anymore at home. Currently she is very confused, getting OOB by herself repeatedly. She is not comprehending what is going on right now with her care. We have received an order for a belt restraint in which Mr Glauser is okay with. He told me to restrain her how ever we needed to. The best number to reach Mr Woolford is [phone].

## 2013-01-22 NOTE — Progress Notes (Signed)
Page returned by attending. No new orders needed at this time.

## 2013-01-23 LAB — GLUCOSE, CAPILLARY
Glucose-Capillary: 140 mg/dL — ABNORMAL HIGH (ref 70–99)
Glucose-Capillary: 129 mg/dL — ABNORMAL HIGH (ref 70–99)
Glucose-Capillary: 114 mg/dL — ABNORMAL HIGH (ref 70–99)
Glucose-Capillary: 152 mg/dL — ABNORMAL HIGH (ref 70–99)

## 2013-01-23 LAB — PREPARE RBC (CROSSMATCH)

## 2013-01-23 MED ORDER — MAGNESIUM HYDROXIDE NICU ORAL SYRINGE 400 MG/5 ML: 30.0000 mL | Freq: Every day | ORAL | Status: DC | PRN

## 2013-01-23 MED ORDER — DOCUSATE SODIUM 100 MG PO CAPS
100.0000 mg | ORAL_CAPSULE | Freq: Two times a day (BID) | ORAL | Status: DC
  Administered 2013-01-23 – 2013-01-25 (×4): 100 mg via ORAL
  Filled 2013-01-23 (×5): qty 1

## 2013-01-23 MED ORDER — POLYETHYLENE GLYCOL 3350 17 G PO PACK
17.0000 g | PACK | Freq: Every day | ORAL | Status: DC
  Administered 2013-01-23: 17 g via ORAL
  Filled 2013-01-23 (×3): qty 1

## 2013-01-23 MED ORDER — MAGNESIUM HYDROXIDE 400 MG/5ML PO SUSP: 30.0000 mL | Freq: Every day | ORAL | Status: DC | PRN

## 2013-01-23 MED ORDER — BISACODYL 10 MG RE SUPP: 10.0000 mg | Freq: Every day | RECTAL | Status: DC | PRN

## 2013-01-23 MED ORDER — FLEET ENEMA 7-19 GM/118ML RE ENEM: 1.0000 | ENEMA | Freq: Every day | RECTAL | Status: DC | PRN

## 2013-01-23 NOTE — Evaluation (Addendum)
Occupational Therapy Evaluation Patient Details Name: [name] MRN: [id number] DOB: 05-13-1933 Today's Date: 01/23/2013 Time: 4098-1191 OT Time Calculation (min): 17 min  OT Assessment / Plan / Recommendation History of present illness pt was admitted for falls at home.  She is on telemetry from SDU (9/10).  SDU was monitoring tachycardia   Clinical Impression   Pt was admitted for falls at home.  She will benefit from skilled OT to increase safety and independence with adls, with min A/min guard goals for most adls.  She was mod I prior to admission    OT Assessment  Patient needs continued OT Services    Follow Up Recommendations  Home health OT (pt refusing snf)    Barriers to Discharge      Equipment Recommendations  3 in 1 bedside comode (possibly)    Recommendations for Other Services    Frequency  Min 2X/week    Precautions / Restrictions Precautions Precautions: Fall Restrictions Weight Bearing Restrictions: No  Patient is legally blind  Pertinent Vitals/Pain Pain in R neck/upper back--L thigh:  Not rated.  Pt had hot pack on upper back/neck    ADL  Grooming: Set up Where Assessed - Grooming: Supine, head of bed up Upper Body Bathing: Set up Where Assessed - Upper Body Bathing: Supine, head of bed up Lower Body Bathing: Moderate assistance Where Assessed - Lower Body Bathing: Rolling right and/or left Upper Body Dressing: Set up Where Assessed - Upper Body Dressing: Supine, head of bed up Lower Body Dressing: Maximal assistance Where Assessed - Lower Body Dressing: Rolling right and/or left Toileting - Clothing Manipulation and Hygiene: Moderate assistance Where Assessed - Toileting Clothing Manipulation and Hygiene: Rolling right and/or left Transfers/Ambulation Related to ADLs: pt had just gotten back to bed with PT.  She is able to roll to R side, but L hurts.  Bed level eval completed due to fatique ADL Comments: Pt should be able to complete sit to  stand level adls but not assessed at this time due to fatique    OT Diagnosis: Generalized weakness  OT Problem List: Decreased strength;Decreased activity tolerance;Decreased knowledge of use of DME or AE;Pain OT Treatment Interventions: Self-care/ADL training;DME and/or AE instruction;Patient/family education;Balance training;Therapeutic activities;Therapeutic exercise; energy conservation  OT Goals(Current goals can be found in the care plan section) Acute Rehab OT Goals Patient Stated Goal: get back to being independent OT Goal Formulation: With patient Time For Goal Achievement: 02/06/13 Potential to Achieve Goals: Good ADL Goals Pt Will Perform Grooming: with min guard assist;standing Pt Will Perform Lower Body Bathing: with min assist;sit to/from stand (simulate with sponge) Pt Will Perform Lower Body Dressing: with mod assist;sit to/from stand (pants only) Pt Will Transfer to Toilet: with min assist;ambulating;bedside commode Pt Will Perform Toileting - Clothing Manipulation and hygiene: with min guard assist;sit to/from stand Additional ADL Goal #1: pt will complete level one theraband program with min A to increase strength for adls  Visit Information  Last OT Received On: 01/23/13 History of Present Illness: pt was admitted for falls at home.  She is on telemetry from SDU (9/10).  SDU was monitoring tachycardia       Prior Functioning     Home Living Family/patient expects to be discharged to:: Private residence Living Arrangements: Spouse/significant other Additional Comments: pt has a built in seat and grab bars in the shower, but she stands as built in seat is too low.  Has been getting to handicapped height commode--no 3:1 Prior Function Level of  Independence: Independent with assistive device(s) Communication Communication: No difficulties         Vision/Perception     Cognition  Cognition Arousal/Alertness: Awake/alert Behavior During Therapy: WFL for  tasks assessed/performed Overall Cognitive Status: Within Functional Limits for tasks assessed    Extremity/Trunk Assessment Upper Extremity Assessment Upper Extremity Assessment: Generalized weakness (strength grossly 3+/5)     Mobility      Exercise    Balance     End of Session OT - End of Session Activity Tolerance: Patient limited by fatigue Patient left: in bed;with call bell/phone within reach;with family/visitor present  GO     Shabreka Coulon 01/23/2013, 3:26 PM Marica Otter, OTR/L [phone] 01/23/2013

## 2013-01-23 NOTE — Progress Notes (Addendum)
    SUBJECTIVE: No complaints.   BP 102/66  Pulse 70  Temp(Src) 98.8 F (37.1 C) (Axillary)  Resp 23  Ht 5\' 5"  (1.651 m)  Wt 105 lb 3.2 oz (47.718 kg)  BMI 17.51 kg/m2  SpO2 92%  Intake/Output Summary (Last 24 hours) at 01/23/13 1610 Last data filed at 01/23/13 0600  Gross per 24 hour  Intake   2440 ml  Output   1660 ml  Net    780 ml    PHYSICAL EXAM General: Thin, cachectic. Alert. Pleasant  Psych:  Good affect, responds appropriately Neck: No JVD. No masses noted.  Lungs: Clear bilaterally with no wheezes or rhonci noted.  Heart: RRR with no murmurs noted. Abdomen: Bowel sounds are present. Soft, non-tender.  Extremities: No lower extremity edema.   LABS: Basic Metabolic Panel:  Recent Labs  96/04/54 0819  01/21/13 1324 01/22/13 0347 01/22/13 1105  NA 136  --  136 136 135  K 4.2  --  4.2 3.5 3.7  CL 102  --  101 99 99  CO2 26  --  29 27 27   GLUCOSE 105*  --  92 81 115*  BUN 21  --  22 11 9   CREATININE 0.57  --  0.61 0.49* 0.43*  CALCIUM 8.7  --  8.9 8.8 8.8  MG  --   < > 1.7 1.5 2.1  PHOS  --   --  4.5  --   --   < > = values in this interval not displayed. CBC:  Recent Labs  01/21/13 0819 01/21/13 1324 01/22/13 0347 01/22/13 1105  WBC 12.4* 10.5 11.0* 10.2  NEUTROABS 8.8* 7.3  --   --   HGB 9.9* 8.8* 8.3* 8.0*  HCT 28.7* 25.9* 24.1* 22.8*  MCV 101.1* 101.6* 101.3* 101.3*  PLT 191 159 156 154   Cardiac Enzymes:  Recent Labs  01/21/13 0819 01/22/13 0347 01/22/13 1105 01/22/13 1553  CKTOTAL 502*  --   --   --   TROPONINI  --  <0.30 <0.30 <0.30   Current Meds: . atorvastatin  20 mg Oral q1800  . cholecalciferol  1,000 Units Oral Daily  . feeding supplement  237 mL Oral BID BM  . folic acid-pyridoxine-cyancobalamin  1 tablet Oral Daily  . insulin aspart  0-5 Units Subcutaneous QHS  . insulin aspart  0-9 Units Subcutaneous TID WC  . memantine  5 mg Oral BID  . metoprolol tartrate  12.5 mg Oral BID  . multivitamin with minerals  1  tablet Oral Daily  . ondansetron (ZOFRAN) IV  4 mg Intravenous Once  . PARoxetine  40 mg Oral Daily  . ramipril  5 mg Oral Daily  . sodium chloride  3 mL Intravenous Q12H  . temazepam  30 mg Oral QHS  . verapamil  180 mg Oral BID     ASSESSMENT AND PLAN:  1. Paroxysmal atrial fib: She has had several falls over the past 2 weeks. Please see full consult note yesterday. Would not recommend restarting anticoagulation given fall risk. Continue metoprolol and verapamil.  2. Anemia: She has a large hematoma in her left leg. Slow fall in H/H. Plans for pRBC 1 unit this am per primary team.    Will sign off. Please call with questions.   [name]  9/10/20146:24 AM

## 2013-01-23 NOTE — Progress Notes (Signed)
Physical Therapy Treatment Patient Details Name: [name] MRN: [id number] DOB: Oct 07, 1932 Today's Date: 01/23/2013 Time: 4098-1191 PT Time Calculation (min): 24 min  PT Assessment / Plan / Recommendation  History of Present Illness pt to SDU to monitor tachycardia and now returning to telemetry floor   PT Comments   Pt agreeable to [facility].  She will benefit from continued PT to increase function and decrease falls  Follow Up Recommendations  SNF (pt agreeable to [facility])     Does the patient have the potential to tolerate intense rehabilitation     Barriers to Discharge        Equipment Recommendations  Rolling walker with 5" wheels    Recommendations for Other Services    Frequency Min 3X/week   Progress towards PT Goals Progress towards PT goals: Progressing toward goals  Plan Current plan remains appropriate    Precautions / Restrictions Precautions Precautions: Fall   Pertinent Vitals/Pain Pt c/o pain in left thigh    Mobility  Bed Mobility Bed Mobility: Sit to Supine Sit to Supine: 3: Mod assist;2: Max assist Details for Bed Mobility Assistance: needs assist to lift legs up onto bed Transfers Transfers: Sit to Stand;Stand to Sit Sit to Stand: 4: Min assist Stand to Sit: 4: Min assist Details for Transfer Assistance: using arms to push up Ambulation/Gait Ambulation/Gait Assistance: 3: Mod assist Ambulation Distance (Feet): 6 Feet Assistive device: Rolling walker Ambulation/Gait Assistance Details: assis for balance and to guide RW Gait Pattern: Step-to pattern;Decreased step length - right;Decreased step length - left;Trunk flexed Gait velocity: decreased General Gait Details: Pt tearful, c/o pain in leg  limited in weight shift and weight bearing Stairs: No    Exercises General Exercises - Lower Extremity Ankle Circles/Pumps: AROM;Both;10 reps;Supine Short Arc Quad: AROM;Right;10 reps;AAROM;Left;Supine;Limitations Short Arc  Quad Limitations: pt with limited in strength in left knee extension with pain in thigh.  Pt with firmness, pain to touch in hematoma of left thigh but no discoloration   PT Diagnosis:    PT Problem List:   PT Treatment Interventions:     PT Goals (current goals can now be found in the care plan section)    Visit Information  Last PT Received On: 01/23/13 History of Present Illness: pt to SDU to monitor tachycardia and now returning to telemetry floor    Subjective Data      Cognition  Cognition Arousal/Alertness: Awake/alert Behavior During Therapy: WFL for tasks assessed/performed Overall Cognitive Status: Within Functional Limits for tasks assessed    Balance     End of Session PT - End of Session Activity Tolerance: Patient limited by fatigue;Patient limited by pain Patient left: in bed;with call bell/phone within reach;with family/visitor present Nurse Communication: Mobility status   GP    [name], [place] [phone] 01/23/2013, 3:18 PM

## 2013-01-23 NOTE — Progress Notes (Signed)
[id number] Caytlyn Evers,Rn,BSN,CCM: Spoke with the patient she has worked with [facility] in the past and would to use theirs services.  Bethany with [facility] notified.

## 2013-01-23 NOTE — Progress Notes (Signed)
Pt bp was 85/40 manually, Patient is resting at the time values were gotten. Patient is no complaining of any other issues she is responding at her baseline. Will make the on call attending are provider aware.

## 2013-01-23 NOTE — Plan of Care (Cosign Needed)
RN paged re BP 85/40- baseline pt HTN and on 3 meds at home. Was resumed on usual Lopressor and Cardizem after transitioned off Cardizem gtt today. Usual Altace not resumed. now recent supra therapeutic INR that required FFP for reversal. Had fall at home with assoc large thigh hematoma. Hgb has been steadily drifting downward and since BP low will go ahead and transfuse 1 unit PRBC's  Junious Silk, ANP

## 2013-01-23 NOTE — Progress Notes (Addendum)
[facility] PROGRESS NOTE  [name] WJX:[id number] DOB: 1932/07/12 DOA: 01/21/2013 PCP: Rene Paci, MD  Assessment/Plan: Frequent Falls -No fractures on xray. -PT recommending SNF, but patient adamantly refuses. -She is agreeable to [facility] services; will place order.  Atrial Fibrillation -Currently in NSR, rate controlled. -Off cardizem drip x 48 hours. -We have decided (with cardiology input) to DC coumadin indefinitely given her high risk of falls.  Left Thigh Hematoma -She refuses to let me examine it today. -Was transfused 1 unit of PRBCs overnight.  Hypotension -Resolved.  DM -Well controlled. -Continue current regimen.  Anemia of Chronic Disease -Was transfused 1 unit of PRBCs overnight for hypotension. -Recheck Hb in am.  Code Status: Full Code Family Communication: Patient only (does not want to involve her husband in her decision-making as "he can be easily manipulated").  Disposition Plan: Home in 24 hours with [facility] services. PT recs SNF, however, patient refuses.   Consultants:  Cardiology   Antibiotics:  None   Subjective: No complaints. Wants to go home.  Objective: Filed Vitals:   01/23/13 0650 01/23/13 0700 01/23/13 0800 01/23/13 0900  BP: 124/53 136/79 120/54 124/73  Pulse: 72 70  73  Temp: 99 F (37.2 C)     TempSrc: Oral     Resp: 23 23 23 24   Height:      Weight:      SpO2: 95% 95%  100%    Intake/Output Summary (Last 24 hours) at 01/23/13 0944 Last data filed at 01/23/13 0900  Gross per 24 hour  Intake 2301.67 ml  Output   1110 ml  Net 1191.67 ml   Filed Weights   01/21/13 1200  Weight: 47.718 kg (105 lb 3.2 oz)    Exam:   General:  AA Ox3, NAD  Cardiovascular: RRR with occasional extrasystolic beats.  Respiratory: CTA B  Abdomen: S/NT/ND/+BS/no masses or organomegaly noted.  Extremities: trace bilateral edema.   Neurologic:  Non-focal.  Data Reviewed: Basic Metabolic Panel:  Recent Labs Lab  01/21/13 0819 01/21/13 1324 01/22/13 0347 01/22/13 1105  NA 136 136 136 135  K 4.2 4.2 3.5 3.7  CL 102 101 99 99  CO2 26 29 27 27   GLUCOSE 105* 92 81 115*  BUN 21 22 11 9   CREATININE 0.57 0.61 0.49* 0.43*  CALCIUM 8.7 8.9 8.8 8.8  MG  --  1.7 1.5 2.1  PHOS  --  4.5  --   --    Liver Function Tests:  Recent Labs Lab 01/21/13 1324 01/22/13 0347  AST 25 29  ALT 13 13  ALKPHOS 43 43  BILITOT 0.5 0.6  PROT 5.8* 6.2  ALBUMIN 3.6 3.6   No results found for this basename: LIPASE, AMYLASE,  in the last 168 hours No results found for this basename: AMMONIA,  in the last 168 hours CBC:  Recent Labs Lab 01/21/13 0819 01/21/13 1324 01/22/13 0347 01/22/13 1105  WBC 12.4* 10.5 11.0* 10.2  NEUTROABS 8.8* 7.3  --   --   HGB 9.9* 8.8* 8.3* 8.0*  HCT 28.7* 25.9* 24.1* 22.8*  MCV 101.1* 101.6* 101.3* 101.3*  PLT 191 159 156 154   Cardiac Enzymes:  Recent Labs Lab 01/21/13 0819 01/22/13 0347 01/22/13 1105 01/22/13 1553  CKTOTAL 502*  --   --   --   TROPONINI  --  <0.30 <0.30 <0.30   BNP (last 3 results) No results found for this basename: PROBNP,  in the last 8760 hours CBG:  Recent Labs Lab 01/21/13  1657 01/22/13 0843 01/22/13 1204 01/22/13 1624 01/23/13 0800  GLUCAP 130* 172* 132* 131* 108*    Recent Results (from the past 240 hour(s))  URINE CULTURE     Status: None   Collection Time    01/21/13 11:10 AM      Result Value Range Status   Specimen Description URINE, CLEAN CATCH   Final   Special Requests NONE   Final   Culture  Setup Time     Final   Value: 01/21/2013 18:04     Performed at [facility] Count     Final   Value: NO GROWTH     Performed at [facility]   Culture     Final   Value: NO GROWTH     Performed at [facility]   Report Status 01/22/2013 FINAL   Final  MRSA PCR SCREENING     Status: None   Collection Time    01/23/13  2:27 AM      Result Value Range Status   MRSA by PCR NEGATIVE  NEGATIVE  Final   Comment:            The GeneXpert MRSA Assay (FDA     approved for NASAL specimens     only), is one component of a     comprehensive MRSA colonization     surveillance program. It is not     intended to diagnose MRSA     infection nor to guide or     monitor treatment for     MRSA infections.     Studies: Dg Femur Left  01/21/2013   *RADIOLOGY REPORT*  Clinical Data: Left thigh pain  LEFT FEMUR - 2 VIEW  Comparison: None.  Findings: No acute fracture or dislocation is noted.  Significant knee degenerative changes are seen.  No gross soft tissue abnormality is noted.  IMPRESSION: No acute abnormalities seen.   Original Report Authenticated By: Alcide Clever, M.D.    Scheduled Meds: . atorvastatin  20 mg Oral q1800  . cholecalciferol  1,000 Units Oral Daily  . feeding supplement  237 mL Oral BID BM  . folic acid-pyridoxine-cyancobalamin  1 tablet Oral Daily  . insulin aspart  0-5 Units Subcutaneous QHS  . insulin aspart  0-9 Units Subcutaneous TID WC  . memantine  5 mg Oral BID  . metoprolol tartrate  12.5 mg Oral BID  . multivitamin with minerals  1 tablet Oral Daily  . ondansetron (ZOFRAN) IV  4 mg Intravenous Once  . PARoxetine  40 mg Oral Daily  . ramipril  5 mg Oral Daily  . sodium chloride  3 mL Intravenous Q12H  . temazepam  30 mg Oral QHS  . verapamil  180 mg Oral BID   Continuous Infusions: . sodium chloride 75 mL/hr at 01/22/13 0046    Principal Problem:   Fall Active Problems:   DIABETES MELLITUS, TYPE II, UNCONTROLLED   VITAMIN D DEFICIENCY   HYPERLIPIDEMIA   ANXIETY DEPRESSION   Atrial fibrillation   Memory loss   Hematoma of left thigh   Supratherapeutic INR   Anemia of chronic disease   Leukocytosis   HTN (hypertension)    Time spent: 35 minutes.    Chaya Jan  [facility] Pager [phone]  If 7PM-7AM, please contact night-coverage at [email or web address], password [facility] 01/23/2013, 9:44 AM  LOS: 2 days

## 2013-01-24 LAB — CBC
HCT: 21.2 % — ABNORMAL LOW (ref 36.0–46.0)
MCHC: 34.9 g/dL (ref 30.0–36.0)
MCV: 95.8 fL (ref 78.0–100.0)
Platelets: 156 10*3/uL (ref 150–400)
RBC: 2.62 MIL/uL — ABNORMAL LOW (ref 3.87–5.11)
RDW: 13.9 % (ref 11.5–15.5)
RDW: 14.6 % (ref 11.5–15.5)
WBC: 7.8 10*3/uL (ref 4.0–10.5)

## 2013-01-24 LAB — GLUCOSE, CAPILLARY
Glucose-Capillary: 104 mg/dL — ABNORMAL HIGH (ref 70–99)
Glucose-Capillary: 102 mg/dL — ABNORMAL HIGH (ref 70–99)
Glucose-Capillary: 102 mg/dL — ABNORMAL HIGH (ref 70–99)

## 2013-01-24 LAB — PREPARE RBC (CROSSMATCH)

## 2013-01-24 NOTE — Progress Notes (Signed)
Took over patient's care from another nurse, agree with her assessment and will continue to monitor patient.  Ernesta Amble, RN

## 2013-01-24 NOTE — Clinical Social Work Psychosocial (Unsigned)
     Clinical Social Work Department BRIEF PSYCHOSOCIAL ASSESSMENT 01/24/2013  Patient:  [name]     Account Number:  [id number]     Admit date:  01/21/2013  Clinical Social Worker:  Hattie Perch  Date/Time:  01/24/2013 12:00 M  Referred by:  Physician  Date Referred:  01/24/2013 Referred for  SNF Placement   Other Referral:   Interview type:  Patient Other interview type:    PSYCHOSOCIAL DATA Living Status:  FAMILY Admitted from facility:   Level of care:   Primary support name:  [name] Primary support relationship to patient:  SPOUSE Degree of support available:   good    CURRENT CONCERNS Current Concerns  Post-Acute Placement   Other Concerns:    SOCIAL WORK ASSESSMENT / PLAN CSW met with patient. patient is alert and oriented X3. patient in need of snf placement. patient states that she has previously been at [facility] for snf and had a good experience there. she is amendable to going back to snf but is unsure where. she would like to talk with her husband. she is agreeable to CSW faxing patient out and recieving bed offers.   Assessment/plan status:   Other assessment/ plan:   Information/referral to community resources:    PATIENTS/FAMILYS RESPONSE TO PLAN OF CARE: patient is reluctantly agreeable to snf placement. she has not decided on a facility at this time.

## 2013-01-24 NOTE — Progress Notes (Signed)
[facility] PROGRESS NOTE  [name] JYN:[id number] DOB: 1933/02/06 DOA: 01/21/2013 PCP: Rene Paci, MD  Assessment/Plan: Frequent Falls -No fractures on xray. -PT recommending SNF, patient is agreeable to going to [facility].  Atrial Fibrillation -Currently in NSR, rate controlled. -Off cardizem drip x 48 hours. -We have decided (with cardiology input) to DC coumadin indefinitely given her high risk of falls.  Left Thigh Hematoma -Was transfused 1 unit of PRBCs overnight. -Hematoma appears stable.  Hypotension -Resolved.  DM -Well controlled. -Continue current regimen.  Anemia of Chronic Disease -Was transfused 1 unit of PRBCs 01/22/13 for hypotension and left thigh hematoma. -Repeat Hb is even lower at 7.4. -Will transfuse another unit of PRBCs today.  Code Status: Full Code Family Communication: Patient only (does not want to involve her husband in her decision-making as "he can be easily manipulated").  Disposition Plan: Hopeful for DC SNF in am as long as Hb stable and SNF bed available.   Consultants:  Cardiology   Antibiotics:  None   Subjective: No complaints. Wants to go home.  Objective: Filed Vitals:   01/24/13 1515 01/24/13 1615 01/24/13 1634 01/24/13 1715  BP: 110/55 116/55 113/54 125/82  Pulse: 86 82 78 78  Temp: 98.2 F (36.8 C) 98 F (36.7 C) 98.5 F (36.9 C) 97.7 F (36.5 C)  TempSrc: Oral Oral Oral Oral  Resp: 16 16    Height:      Weight:      SpO2: 98%       Intake/Output Summary (Last 24 hours) at 01/24/13 1723 Last data filed at 01/24/13 1500  Gross per 24 hour  Intake   3135 ml  Output   3605 ml  Net   -470 ml   Filed Weights   01/21/13 1200 01/24/13 0500  Weight: 47.718 kg (105 lb 3.2 oz) 51.2 kg (112 lb 14 oz)    Exam:   General:  AA Ox3, NAD  Cardiovascular: RRR with occasional extrasystolic beats.  Respiratory: CTA B  Abdomen: S/NT/ND/+BS/no masses or organomegaly noted.  Extremities: trace  bilateral edema.   Neurologic:  Non-focal.  Data Reviewed: Basic Metabolic Panel:  Recent Labs Lab 01/21/13 0819 01/21/13 1324 01/22/13 0347 01/22/13 1105  NA 136 136 136 135  K 4.2 4.2 3.5 3.7  CL 102 101 99 99  CO2 26 29 27 27   GLUCOSE 105* 92 81 115*  BUN 21 22 11 9   CREATININE 0.57 0.61 0.49* 0.43*  CALCIUM 8.7 8.9 8.8 8.8  MG  --  1.7 1.5 2.1  PHOS  --  4.5  --   --    Liver Function Tests:  Recent Labs Lab 01/21/13 1324 01/22/13 0347  AST 25 29  ALT 13 13  ALKPHOS 43 43  BILITOT 0.5 0.6  PROT 5.8* 6.2  ALBUMIN 3.6 3.6   No results found for this basename: LIPASE, AMYLASE,  in the last 168 hours No results found for this basename: AMMONIA,  in the last 168 hours CBC:  Recent Labs Lab 01/21/13 0819 01/21/13 1324 01/22/13 0347 01/22/13 1105 01/24/13 0425  WBC 12.4* 10.5 11.0* 10.2 7.1  NEUTROABS 8.8* 7.3  --   --   --   HGB 9.9* 8.8* 8.3* 8.0* 7.4*  HCT 28.7* 25.9* 24.1* 22.8* 21.2*  MCV 101.1* 101.6* 101.3* 101.3* 99.5  PLT 191 159 156 154 125*   Cardiac Enzymes:  Recent Labs Lab 01/21/13 0819 01/22/13 0347 01/22/13 1105 01/22/13 1553  CKTOTAL 502*  --   --   --  TROPONINI  --  <0.30 <0.30 <0.30   BNP (last 3 results) No results found for this basename: PROBNP,  in the last 8760 hours CBG:  Recent Labs Lab 01/23/13 0800 01/23/13 1212 01/23/13 1650 01/23/13 2106 01/24/13 0746  GLUCAP 108* 129* 114* 152* 104*    Recent Results (from the past 240 hour(s))  URINE CULTURE     Status: None   Collection Time    01/21/13 11:10 AM      Result Value Range Status   Specimen Description URINE, CLEAN CATCH   Final   Special Requests NONE   Final   Culture  Setup Time     Final   Value: 01/21/2013 18:04     Performed at [facility] Count     Final   Value: NO GROWTH     Performed at [facility]   Culture     Final   Value: NO GROWTH     Performed at [facility]   Report Status 01/22/2013 FINAL    Final  MRSA PCR SCREENING     Status: None   Collection Time    01/23/13  2:27 AM      Result Value Range Status   MRSA by PCR NEGATIVE  NEGATIVE Final   Comment:            The GeneXpert MRSA Assay (FDA     approved for NASAL specimens     only), is one component of a     comprehensive MRSA colonization     surveillance program. It is not     intended to diagnose MRSA     infection nor to guide or     monitor treatment for     MRSA infections.     Studies: No results found.  Scheduled Meds: . atorvastatin  20 mg Oral q1800  . cholecalciferol  1,000 Units Oral Daily  . docusate sodium  100 mg Oral BID  . feeding supplement  237 mL Oral BID BM  . folic acid-pyridoxine-cyancobalamin  1 tablet Oral Daily  . insulin aspart  0-5 Units Subcutaneous QHS  . insulin aspart  0-9 Units Subcutaneous TID WC  . memantine  5 mg Oral BID  . metoprolol tartrate  12.5 mg Oral BID  . multivitamin with minerals  1 tablet Oral Daily  . ondansetron (ZOFRAN) IV  4 mg Intravenous Once  . PARoxetine  40 mg Oral Daily  . polyethylene glycol  17 g Oral Daily  . ramipril  5 mg Oral Daily  . sodium chloride  3 mL Intravenous Q12H  . temazepam  30 mg Oral QHS  . verapamil  180 mg Oral BID   Continuous Infusions: . sodium chloride 75 mL/hr at 01/24/13 1236    Principal Problem:   Fall Active Problems:   DIABETES MELLITUS, TYPE II, UNCONTROLLED   VITAMIN D DEFICIENCY   HYPERLIPIDEMIA   ANXIETY DEPRESSION   Atrial fibrillation   Memory loss   Hematoma of left thigh   Supratherapeutic INR   Anemia of chronic disease   Leukocytosis   HTN (hypertension)    Time spent: 35 minutes.    Chaya Jan  [facility] Pager [phone]  If 7PM-7AM, please contact night-coverage at [email or web address], password [facility] 01/24/2013, 5:23 PM  LOS: 3 days

## 2013-01-24 NOTE — Care Management Note (Signed)
    Page 1 of 1   01/24/2013     3:38:42 PM   CARE MANAGEMENT NOTE 01/24/2013  Patient:  [facility]   Account Number:  [id number]  Date Initiated:  01/21/2013  Documentation initiated by:  DAVIS,TYMEEKA  Subjective/Objective Assessment:   79 yo female admitted with left thigh hematoma and anemia.     Action/Plan:   Home when stable   Anticipated DC Date:  01/25/2013   Anticipated DC Plan:  SKILLED NURSING FACILITY      DC Planning Services  CM consult      Choice offered to / List presented to:             Status of service:  In process, will continue to follow Medicare Important Message given?   (If response is "NO", the following Medicare IM given date fields will be blank) Date Medicare IM given:   Date Additional Medicare IM given:    Discharge Disposition:    Per UR Regulation:  Reviewed for med. necessity/level of care/duration of stay  If discussed at Long Length of Stay Meetings, dates discussed:    Comments:  01/24/13 Yunuen Mordan RN,BSN NCM [phone] TRANSFER FROM SDU.PT-SNF.[facility] KRISTEN AWARE & FOLLOWING IF PLAN IS FOR HOME.SW FOLLOWING-FAXED OUT.  01/21/13 1240 Tymeeka Davis,RN,MSN [phone] UR complete

## 2013-01-24 NOTE — Progress Notes (Signed)
Physical Therapy Treatment Patient Details Name: [name] MRN: [id number] DOB: 07-01-1932 Today's Date: 01/24/2013 Time: 7829-5621 PT Time Calculation (min): 15 min  PT Assessment / Plan / Recommendation  History of Present Illness Pt reports she has been up several times with nursing this morning and has just returned to bed. Agrees to exercise   PT Comments   Left thigh hematoma appears softer and pt is better able to perform active exercise with quad and hamstrings on the left.  Pt able to move LE's and UE's but deferred bed mobility and standing today  Follow Up Recommendations  SNF     Does the patient have the potential to tolerate intense rehabilitation     Barriers to Discharge        Equipment Recommendations       Recommendations for Other Services    Frequency     Progress towards PT Goals Progress towards PT goals: Progressing toward goals  Plan Current plan remains appropriate    Precautions / Restrictions Precautions Precautions: Fall Restrictions Weight Bearing Restrictions: No   Pertinent Vitals/Pain Continues with some pain in left leg, but not as much as yesterday    Mobility       Exercises General Exercises - Lower Extremity Ankle Circles/Pumps: AROM;Both;10 reps;Supine Short Arc Quad: AROM;Right;10 reps;AAROM;Left;Supine Short Arc Quad Limitations: Pt is able to do SAQ with left leg today Hip ABduction/ADduction: AAROM;Both;10 reps;Supine Hip Flexion/Marching: AROM;Both;10 reps;Supine Other Exercises Other Exercises: bilatera UE hip to hip for core activation   PT Diagnosis:    PT Problem List:   PT Treatment Interventions:     PT Goals (current goals can now be found in the care plan section)    Visit Information  Last PT Received On: 01/24/13 Assistance Needed: +1 History of Present Illness: Pt reports she has been up several times with nursing this morning and has just returned to bed. Agrees to exercise    Subjective Data       Cognition  Cognition Arousal/Alertness: Awake/alert Behavior During Therapy: WFL for tasks assessed/performed Overall Cognitive Status: Within Functional Limits for tasks assessed    Balance     End of Session PT - End of Session Activity Tolerance: Patient limited by fatigue Patient left: in bed;with call bell/phone within reach   GP    Teresa K. Steger, [place] [phone] 01/24/2013, 12:23 PM

## 2013-01-25 ENCOUNTER — Telehealth: Payer: Self-pay | Admitting: *Deleted

## 2013-01-25 LAB — TYPE AND SCREEN
ABO/RH(D): O POS
Antibody Screen: NEGATIVE
Unit division: 0

## 2013-01-25 LAB — CBC
Hemoglobin: 9.1 g/dL — ABNORMAL LOW (ref 12.0–15.0)
MCH: 33.5 pg (ref 26.0–34.0)
MCHC: 34.6 g/dL (ref 30.0–36.0)
Platelets: 169 10*3/uL (ref 150–400)
RDW: 14.9 % (ref 11.5–15.5)

## 2013-01-25 MED ORDER — ACETAMINOPHEN 325 MG PO TABS: 650.0000 mg | ORAL_TABLET | Freq: Four times a day (QID) | ORAL | Status: DC | PRN

## 2013-01-25 MED ORDER — DIAZEPAM 5 MG PO TABS: 5.0000 mg | ORAL_TABLET | Freq: Four times a day (QID) | ORAL | Status: DC | PRN

## 2013-01-25 MED ORDER — ADULT MULTIVITAMIN W/MINERALS CH: 1.0000 | ORAL_TABLET | Freq: Every day | ORAL | Status: AC

## 2013-01-25 MED ORDER — OXYCODONE-ACETAMINOPHEN 5-325 MG PO TABS: 1.0000 | ORAL_TABLET | Freq: Four times a day (QID) | ORAL | Status: DC | PRN

## 2013-01-25 MED ORDER — FENTANYL 50 MCG/HR TD PT72: 1.0000 | MEDICATED_PATCH | TRANSDERMAL | Status: DC

## 2013-01-25 NOTE — Progress Notes (Addendum)
Occupational Therapy Treatment Patient Details Name: [name] MRN: [id number] DOB: [birth year] Today's Date: 01/25/2013 Time: 4098-1191 OT Time Calculation (min): 21 min  OT Assessment / Plan / Recommendation  History of present illness Pt with hx of falls   OT comments  Pt making progress with decreased assist for toilet transfers and toileting, however, pt continues to require multimodal cues for safety during ADL/functional mobility  Follow Up Recommendations  SNF    Barriers to Discharge       Equipment Recommendations   3 in 1   Recommendations for Other Services    Frequency Min 2X/week   Progress towards OT Goals Progress towards OT goals: Progressing toward goals  Plan Discharge plan remains appropriate    Precautions / Restrictions Precautions Precautions: Fall Precaution Comments: decreased vision Restrictions Weight Bearing Restrictions: No   Pertinent Vitals/Pain 3/10    ADL  Grooming: Performed;Min guard;Wash/dry face;Wash/dry hands Where Assessed - Grooming: Supported Copywriter, advertising: Performed;Moderate assistance Toilet Transfer Method: Sit to stand Toilet Transfer Equipment: Raised toilet seat with arms (or 3-in-1 over toilet);Grab bars Toileting - Clothing Manipulation and Hygiene: Performed;Minimal assistance;Moderate assistance Where Assessed - Toileting Clothing Manipulation and Hygiene: Standing Tub/Shower Transfer Method: Not assessed Transfers/Ambulation Related to ADLs: verbal/physical cues for safety    OT Diagnosis:    OT Problem List:   OT Treatment Interventions:     OT Goals(current goals can now be found in the care plan section)    Visit Information  Last OT Received On: 01/25/13 Assistance Needed: +1 History of Present Illness: Pt with hx of falls    Subjective Data      Prior Functioning       Cognition       Mobility  Bed Mobility Bed Mobility: Supine to Sit;Sitting - Scoot to Edge of Bed Supine to Sit:  3: Mod assist Sitting - Scoot to Edge of Bed: 4: Min assist Details for Bed Mobility Assistance: Pt OOB in recliner Transfers Transfers: Sit to Stand;Stand to Sit Sit to Stand: 3: Mod assist;From bed;From chair/3-in-1 Stand to Sit: 4: Min assist;To chair/3-in-1 Details for Transfer Assistance: verbal and physical cues for safety/technique, correct hand placement    Exercises      Balance Balance Balance Assessed: Yes Dynamic Standing Balance Dynamic Standing - Balance Support: No upper extremity supported;Left upper extremity supported;During functional activity Dynamic Standing - Level of Assistance: 4: Min assist   End of Session OT - End of Session Equipment Utilized During Treatment: Gait belt;Rolling walker;Other (comment) (3 in 1) Patient left: in chair;with call bell/phone within reach;with chair alarm set  GO     [name] 01/25/2013, 1:05 PM

## 2013-01-25 NOTE — Telephone Encounter (Signed)
Pt called states she is an inpatient at [facility], requests a return call from Dr Felicity Coyer.

## 2013-01-25 NOTE — Progress Notes (Signed)
Physical Therapy Treatment Patient Details Name: [name] MRN: [id number] DOB: Aug 05, 1932 Today's Date: 01/25/2013 Time: 0937-1000 PT Time Calculation (min): 23 min  PT Assessment / Plan / Recommendation  History of Present Illness  Pt admitted 2nd fall   PT Comments   Pt OOB in recliner. Amb pt twice with RW then [facility] as that is was she used prior to admission.  Very unsteady gait with slow corrective response and poor posture.  Pt requires ST SNF to address balance and strength deficits.   Follow Up Recommendations  SNF     Does the patient have the potential to tolerate intense rehabilitation     Barriers to Discharge        Equipment Recommendations  Rolling walker with 5" wheels    Recommendations for Other Services    Frequency Min 3X/week   Progress towards PT Goals Progress towards PT goals: Progressing toward goals  Plan      Precautions / Restrictions Precautions Precautions: Fall Precaution Comments: decreased vision Restrictions Weight Bearing Restrictions: No    Pertinent Vitals/Pain C/o "sore" L hip    Mobility  Bed Mobility Bed Mobility: Not assessed Details for Bed Mobility Assistance: Pt OOB in recliner Transfers Transfers: Sit to Stand;Stand to Sit Sit to Stand: 4: Min assist;3: Mod assist Stand to Sit: 4: Min assist;3: Mod assist Details for Transfer Assistance: 75% VC's on proper tech and hand placement as pt tends to pull self up on walker then allowed herself to fall into chair uncontrolled with hands still on walker during stand to sit. Ambulation/Gait Ambulation/Gait Assistance: 3: Mod assist Ambulation Distance (Feet): 25 Feet Assistive device: Rolling walker;Straight cane Ambulation/Gait Assistance Details: First amb using RW which pt required assist with direction and safety during turns.  Amb again with [facility] (Prior AD from admission) which pt was even more unsterady and required MOD assist to prevernt falls.  Pt demon poor self  corrective reaction responce. Pt demon poor standing posture of forward flexion and is a HIGH FALL RISK . Gait Pattern: Step-to pattern;Decreased step length - right;Decreased step length - left;Trunk flexed Gait velocity: decreased    Exercises     PT Diagnosis:    PT Problem List:   PT Treatment Interventions:     PT Goals (current goals can now be found in the care plan section)    Visit Information  Last PT Received On: 01/25/13 Assistance Needed: +1    Subjective Data      Cognition    good   Balance   poor  End of Session PT - End of Session Equipment Utilized During Treatment: Gait belt Activity Tolerance: Patient limited by fatigue Patient left: in chair;with call bell/phone within reach Nurse Communication: Mobility status   Felecia Shelling  PTA [facility]  Acute  Rehab Pager      [phone]

## 2013-01-25 NOTE — Discharge Summary (Signed)
Physician Discharge Summary  [name] ZOX:[id number] DOB: [birth year] DOA: 01/21/2013  PCP: Rene Paci, MD  Admit date: 01/21/2013 Discharge date: 01/25/2013  Time spent: 45 minutes  Recommendations for Outpatient Follow-up:  -Will be discharged to SNF today. -Warfarin has been discontinued due to fall risk.   Discharge Diagnoses:  Principal Problem:   Fall Active Problems:   DIABETES MELLITUS, TYPE II, UNCONTROLLED   VITAMIN D DEFICIENCY   HYPERLIPIDEMIA   ANXIETY DEPRESSION   Atrial fibrillation   Memory loss   Hematoma of left thigh   Supratherapeutic INR   Anemia of chronic disease   Leukocytosis   HTN (hypertension)   Discharge Condition: Stable and improved  Filed Weights   01/21/13 1200 01/24/13 0500  Weight: 47.718 kg (105 lb 3.2 oz) 51.2 kg (112 lb 14 oz)    History of present illness:  Patient is a 79 year old female with past medical history of atrial fibrillation on anticoagulation with Coumadin, diabetes, hypertension, dementia, anxiety and depression who presented to [facility] ED 01/21/2013 status post fall 3 days prior to this admission. Patient reports not being able to get up this morning for which reason she called EMS. Patient reports having multiple falls in last month or so. She has a slight dementia and is a fair historian. Patient reports no prodromal symptoms prior to fall. No chest pain, shortness of breath or palpitations. No reports of abdominal pain, nausea or vomiting. She reports no loss of consciousness. No fevers or chills.  In ED, vital signs showed blood pressure of 92/50, heart rate 64 and T max 98.3 F, oxygen saturation 93% on room air. Patient was found to have left thigh hematoma and her INR was elevated at 4.53. X-ray of the left hip and knee did not reveal acute fractures. CBC revealed leukocytosis of 12.4 and hemoglobin of 9.9. BMP was essentially unremarkable. We were asked to admit her for further evaluation and management.   Hospital  Course:   Frequent Falls  -No fractures on xray.  -PT recommending SNF, patient is agreeable to going to [facility].   Atrial Fibrillation  -Currently in NSR, rate controlled.  -We have decided (with cardiology input) to DC coumadin indefinitely given her high risk of falls.   Left Thigh Hematoma  -Has been transfused a total of 2 units of PRBCs. -Hematoma appears stable.   Hypotension  -Resolved.   DM  -Well controlled.  -Continue current regimen.   Anemia of Chronic Disease  -Has been transfused 2 units of PRBCs 01/22/13 and 01/23/13 for hypotension and left thigh hematoma.  -Repeat Hb is stable at 9.1.     Procedures:  None   Consultations:  Cardiology  Discharge Instructions  Discharge Orders   Future Appointments Provider Department Dept Phone   02/01/2013 9:10 AM Beatrice Lecher, PA-C [facility] Main Office [place]) [phone]   Future Orders Complete By Expires   Diet - low sodium heart healthy  As directed    Discontinue IV  As directed    Increase activity slowly  As directed        Medication List    STOP taking these medications       ONE TOUCH TEST STRIPS test strip  Generic drug:  glucose blood     oxyCODONE-acetaminophen 10-325 MG per tablet  Commonly known as:  PERCOCET     warfarin 5 MG tablet  Commonly known as:  COUMADIN      TAKE these medications  acetaminophen 325 MG tablet  Commonly known as:  TYLENOL  Take 2 tablets (650 mg total) by mouth every 6 (six) hours as needed.     b complex vitamins tablet  Take 1 tablet by mouth daily.     diazepam 5 MG tablet  Commonly known as:  VALIUM  Take 1 tablet (5 mg total) by mouth every 6 (six) hours as needed for anxiety.     fentaNYL 50 MCG/HR  Commonly known as:  DURAGESIC - dosed mcg/hr  Place 1 patch (50 mcg total) onto the skin every 3 (three) days.     glimepiride 2 MG tablet  Commonly known as:  AMARYL  Take 2 mg by mouth 2 (two) times daily.     Memantine  HCl ER 14 MG Cp24  Commonly known as:  NAMENDA XR  Take 1 capsule by mouth daily.     metFORMIN 500 MG tablet  Commonly known as:  GLUCOPHAGE  Take 500 mg by mouth 2 (two) times daily with a meal.     metoprolol tartrate 25 MG tablet  Commonly known as:  LOPRESSOR  Take 12.5 mg by mouth 2 (two) times daily.     multivitamin with minerals Tabs tablet  Take 1 tablet by mouth daily.     PARoxetine 40 MG tablet  Commonly known as:  PAXIL  Take 40 mg by mouth every morning.     pravastatin 80 MG tablet  Commonly known as:  PRAVACHOL  Take 80 mg by mouth daily.     ramipril 5 MG capsule  Commonly known as:  ALTACE  Take 5 mg by mouth daily.     temazepam 30 MG capsule  Commonly known as:  RESTORIL  Take 30 mg by mouth at bedtime.     verapamil 180 MG CR tablet  Commonly known as:  CALAN-SR  Take 180 mg by mouth 2 (two) times daily.     Vitamin D 1000 UNITS capsule  Take 1,000 Units by mouth daily.       Allergies  Allergen Reactions  . Codeine Other (See Comments)    unknown  . Morphine And Related Other (See Comments)    Pt had severe respiratory depression and excessive sedation during a recent hospitalization and was told it was 2/2 morphine.      The results of significant diagnostics from this hospitalization (including imaging, microbiology, ancillary and laboratory) are listed below for reference.    Significant Diagnostic Studies: Dg Chest 1 View  01/21/2013   *RADIOLOGY REPORT*  Clinical Data: Hip pain secondary to recent falls.  CHEST - 1 VIEW  Comparison: Chest x-ray dated 05/03/2012  Findings: Heart size and pulmonary vascularity are normal.  Slight linear atelectasis at the left lung base.  Chronic peribronchial thickening.  Old compression fracture of the mid thoracic spine treated with vertebroplasty.  No acute osseous abnormality.  IMPRESSION: Slight atelectasis at the left lung base.   Original Report Authenticated By: Francene Boyers, M.D.   Dg Hip  Complete Left  01/21/2013   *RADIOLOGY REPORT*  Clinical Data: Left hip pain.  Low back pain.  Recent falls.  LEFT HIP - COMPLETE 2+ VIEW  Comparison: Radiographs and CT scan dated 05/15/2012  Findings: There is abnormal sclerosis as well as inhomogeneous lucency in both femoral heads consistent with moderate osteoarthritis as visualized on the prior CT scan.  There is no acute fracture or dislocation.  There is an old deformity of the left side of the symphysis  and of the left superior and inferior pubic rami consistent with old healed fractures.  There is a rotoscoliosis of the lumbar spine with degenerative disc and joint disease.  IMPRESSION:  1.  No acute osseous abnormality. Moderate arthritis of both hip joints. 3.  Old fractures of the left ischium.   Original Report Authenticated By: Francene Boyers, M.D.   Dg Femur Left  01/21/2013   *RADIOLOGY REPORT*  Clinical Data: Left thigh pain  LEFT FEMUR - 2 VIEW  Comparison: None.  Findings: No acute fracture or dislocation is noted.  Significant knee degenerative changes are seen.  No gross soft tissue abnormality is noted.  IMPRESSION: No acute abnormalities seen.   Original Report Authenticated By: Alcide Clever, M.D.   Dg Knee Complete 4 Views Left  01/21/2013   *RADIOLOGY REPORT*  Clinical Data: Left knee pain with limited range of motion, recent fall, could not straighten knee completely for exam, hip pain  LEFT KNEE - COMPLETE 4+ VIEW  Comparison: None  Findings: Osseous demineralization. Osteoarthritic changes with tricompartmental joint space narrowing and spur formation greatest at medial compartment. No definite acute fracture, dislocation, or bone destruction. Scattered atherosclerotic calcifications. No knee joint effusion.  IMPRESSION: Osseous demineralization with osteoarthritic changes left knee. No acute abnormalities.   Original Report Authenticated By: Ulyses Southward, M.D.    Microbiology: Recent Results (from the past 240 hour(s))  URINE  CULTURE     Status: None   Collection Time    01/21/13 11:10 AM      Result Value Range Status   Specimen Description URINE, CLEAN CATCH   Final   Special Requests NONE   Final   Culture  Setup Time     Final   Value: 01/21/2013 18:04     Performed at [facility] Count     Final   Value: NO GROWTH     Performed at [facility]   Culture     Final   Value: NO GROWTH     Performed at [facility]   Report Status 01/22/2013 FINAL   Final  MRSA PCR SCREENING     Status: None   Collection Time    01/23/13  2:27 AM      Result Value Range Status   MRSA by PCR NEGATIVE  NEGATIVE Final   Comment:            The GeneXpert MRSA Assay (FDA     approved for NASAL specimens     only), is one component of a     comprehensive MRSA colonization     surveillance program. It is not     intended to diagnose MRSA     infection nor to guide or     monitor treatment for     MRSA infections.     Labs: Basic Metabolic Panel:  Recent Labs Lab 01/21/13 0819 01/21/13 1324 01/22/13 0347 01/22/13 1105  NA 136 136 136 135  K 4.2 4.2 3.5 3.7  CL 102 101 99 99  CO2 26 29 27 27   GLUCOSE 105* 92 81 115*  BUN 21 22 11 9   CREATININE 0.57 0.61 0.49* 0.43*  CALCIUM 8.7 8.9 8.8 8.8  MG  --  1.7 1.5 2.1  PHOS  --  4.5  --   --    Liver Function Tests:  Recent Labs Lab 01/21/13 1324 01/22/13 0347  AST 25 29  ALT 13 13  ALKPHOS 43 43  BILITOT  0.5 0.6  PROT 5.8* 6.2  ALBUMIN 3.6 3.6   No results found for this basename: LIPASE, AMYLASE,  in the last 168 hours No results found for this basename: AMMONIA,  in the last 168 hours CBC:  Recent Labs Lab 01/21/13 0819 01/21/13 1324 01/22/13 0347 01/22/13 1105 01/24/13 0425 01/24/13 2028 01/25/13 0425  WBC 12.4* 10.5 11.0* 10.2 7.1 7.8 9.0  NEUTROABS 8.8* 7.3  --   --   --   --   --   HGB 9.9* 8.8* 8.3* 8.0* 7.4* 8.8* 9.1*  HCT 28.7* 25.9* 24.1* 22.8* 21.2* 25.1* 26.3*  MCV 101.1* 101.6* 101.3* 101.3*  99.5 95.8 96.7  PLT 191 159 156 154 125* 156 169   Cardiac Enzymes:  Recent Labs Lab 01/21/13 0819 01/22/13 0347 01/22/13 1105 01/22/13 1553  CKTOTAL 502*  --   --   --   TROPONINI  --  <0.30 <0.30 <0.30   BNP: BNP (last 3 results) No results found for this basename: PROBNP,  in the last 8760 hours CBG:  Recent Labs Lab 01/24/13 0746 01/24/13 1139 01/24/13 1639 01/24/13 2010 01/25/13 0730  GLUCAP 104* 102* 136* 102* 105*       Signed:  HERNANDEZ ACOSTA,ESTELA  [facility] Pager: [phone] 01/25/2013, 11:05 AM

## 2013-01-25 NOTE — Progress Notes (Addendum)
CSW submitted blue medicare auth.  [name] MSW, LCSW [phone] Received ambulance [name] #[id number], packet copied and placed in [facility]. Eber Jones from [facility] completed paperwork with patient. Patients spouse is aware and agreeable. Patient continues to be reluctant but agreeable. ptar called for transportation. Patient understands no guarantee of payment.  Eero Dini C. Tip Atienza MSW, LCSW [phone]

## 2013-01-25 NOTE — Clinical Social Work Placement (Signed)
     Clinical Social Work Department CLINICAL SOCIAL WORK PLACEMENT NOTE 01/25/2013  Patient:  [facility]  Account Number:  [id number] Admit date:  01/21/2013  Clinical Social Worker:  Becky Sax, LCSW  Date/time:  01/25/2013 12:00 M  Clinical Social Work is seeking post-discharge placement for this patient at the following level of care:   SKILLED NURSING   (*CSW will update this form in Epic as items are completed)   01/25/2013  Patient/family provided with [facility] Department of Clinical Social Works list of facilities offering this level of care within the geographic area requested by the patient (or if unable, by the patients family).  01/25/2013  Patient/family informed of their freedom to choose among providers that offer the needed level of care, that participate in Medicare, Medicaid or managed care program needed by the patient, have an available bed and are willing to accept the patient.  01/25/2013  Patient/family informed of [facility] ownership interest in [facility], as well as of the fact that they are under no obligation to receive care at this facility.  PASARR submitted to EDS on 01/25/2013 PASARR number received from EDS on 01/25/2013  FL2 transmitted to all facilities in geographic area requested by pt/family on  01/25/2013 FL2 transmitted to all facilities within larger geographic area on   Patient informed that his/her managed care company has contracts with or will negotiate with  certain facilities, including the following:     Patient/family informed of bed offers received:  01/25/2013 Patient chooses bed at [facility] Physician recommends and patient chooses bed at    Patient to be transferred to [facility] on  01/25/2013 Patient to be transferred to facility by ptar  The following physician request were entered in Epic:   Additional Comments:

## 2013-01-28 ENCOUNTER — Other Ambulatory Visit: Payer: Self-pay | Admitting: *Deleted

## 2013-01-28 ENCOUNTER — Encounter: Payer: Self-pay | Admitting: Adult Health

## 2013-01-28 ENCOUNTER — Non-Acute Institutional Stay (SKILLED_NURSING_FACILITY): Payer: Medicare Other | Admitting: Adult Health

## 2013-01-28 DIAGNOSIS — W19XXXA Unspecified fall, initial encounter: Secondary | ICD-10-CM

## 2013-01-28 DIAGNOSIS — F329 Major depressive disorder, single episode, unspecified: Secondary | ICD-10-CM | POA: Insufficient documentation

## 2013-01-28 DIAGNOSIS — D638 Anemia in other chronic diseases classified elsewhere: Secondary | ICD-10-CM

## 2013-01-28 DIAGNOSIS — F0390 Unspecified dementia without behavioral disturbance: Secondary | ICD-10-CM

## 2013-01-28 DIAGNOSIS — E559 Vitamin D deficiency, unspecified: Secondary | ICD-10-CM

## 2013-01-28 DIAGNOSIS — S7012XA Contusion of left thigh, initial encounter: Secondary | ICD-10-CM

## 2013-01-28 DIAGNOSIS — W19XXXD Unspecified fall, subsequent encounter: Secondary | ICD-10-CM

## 2013-01-28 DIAGNOSIS — I4891 Unspecified atrial fibrillation: Secondary | ICD-10-CM

## 2013-01-28 DIAGNOSIS — G4700 Insomnia, unspecified: Secondary | ICD-10-CM

## 2013-01-28 DIAGNOSIS — S7010XA Contusion of unspecified thigh, initial encounter: Secondary | ICD-10-CM

## 2013-01-28 DIAGNOSIS — E785 Hyperlipidemia, unspecified: Secondary | ICD-10-CM

## 2013-01-28 HISTORY — DX: Unspecified dementia, unspecified severity, without behavioral disturbance, psychotic disturbance, mood disturbance, and anxiety: F03.90

## 2013-01-28 HISTORY — DX: Insomnia, unspecified: G47.00

## 2013-01-28 NOTE — Progress Notes (Signed)
Patient ID: [name], female   DOB: 16-May-1933, 79 y.o.   MRN: [id number]       PROGRESS NOTE  DATE: 01/28/2013  FACILITY:  [facility]  LEVEL OF CARE: SNF (31)  Acute Visit  CHIEF COMPLAINT:  Follow-up hospitalization  HISTORY OF PRESENT ILLNESS: This is a 79 year old female who was been admitted to [facility] on 01/25/13 from [facility]. Patient has been having multiple falls at home. It was noted that patient has hypotension, 92/50, and a left thigh hematoma. She has an INR of 4.53 and was taking Coumadin for atrial fibrillation. No fractures were noted. She is here for a short-term rehabilitation.  PAST MEDICAL HISTORY : Reviewed.  No changes.  CURRENT MEDICATIONS: Reviewed per [facility]  REVIEW OF SYSTEMS:  GENERAL: no change in appetite, no fatigue, no weight changes, no fever, chills or weakness RESPIRATORY: no cough, SOB, DOE,, wheezing, hemoptysis CARDIAC: no chest pain, edema or palpitations GI: no abdominal pain, diarrhea, constipation, heart burn, nausea or vomiting  PHYSICAL EXAMINATION  VS:  T 98.8        P 81       RR 18       BP 118/62               GENERAL: no acute distress, normal body habitus EYES: conjunctivae normal, sclerae normal, normal eye lids NECK: supple, trachea midline, no neck masses, no thyroid tenderness, no thyromegaly LYMPHATICS: no LAN in the neck, no supraclavicular LAN RESPIRATORY: breathing is even & unlabored, BS CTAB CARDIAC: RRR, no murmur,no extra heart sounds, no edema GI: abdomen soft, normal BS, no masses, no tenderness, no hepatomegaly, no splenomegaly PSYCHIATRIC: the patient is alert & oriented to person, affect & behavior appropriate  LABS/RADIOLOGY: 01/25/13 WBC 9.0 hemoglobin 9.1 hematocrit 26.3 01/22/13 sodium 135 potassium 3.7 glucose 115 BUN 9 creatinine 0.43 calcium 8.8 magnesium 2.1 AST 29 ALT 13 albumin 2.6 protein 6.2   ASSESSMENT/PLAN:  Frequent falls - for PT and OT  Atrial  fibrillation - rate controlled; Coumadin was discontinued in the hospital due to high fall risk  Left thigh hematoma - stable  Diabetes mellitus, type II - well controlled  Anemia of chronic disease - stable  Dementia - stable  Depression - stable  Vitamin D deficiency continue supplementation  Insomnia - No complaints  Hyperlipidemia  - continue Pravachol     CPT CODE: 40981

## 2013-01-28 NOTE — Progress Notes (Signed)
Utilization review completed.  

## 2013-01-30 ENCOUNTER — Non-Acute Institutional Stay (SKILLED_NURSING_FACILITY): Payer: Medicare Other | Admitting: Internal Medicine

## 2013-01-30 DIAGNOSIS — E119 Type 2 diabetes mellitus without complications: Secondary | ICD-10-CM

## 2013-01-30 DIAGNOSIS — I4891 Unspecified atrial fibrillation: Secondary | ICD-10-CM

## 2013-01-30 DIAGNOSIS — D62 Acute posthemorrhagic anemia: Secondary | ICD-10-CM

## 2013-01-30 DIAGNOSIS — I10 Essential (primary) hypertension: Secondary | ICD-10-CM

## 2013-01-31 ENCOUNTER — Other Ambulatory Visit: Payer: Self-pay | Admitting: *Deleted

## 2013-01-31 MED ORDER — OXYCODONE-ACETAMINOPHEN 5-325 MG PO TABS: ORAL_TABLET | ORAL | Status: DC

## 2013-02-01 ENCOUNTER — Ambulatory Visit (INDEPENDENT_AMBULATORY_CARE_PROVIDER_SITE_OTHER): Payer: Medicare Other | Admitting: Physician Assistant

## 2013-02-01 ENCOUNTER — Other Ambulatory Visit: Payer: Self-pay | Admitting: Internal Medicine

## 2013-02-01 ENCOUNTER — Encounter: Payer: Self-pay | Admitting: Physician Assistant

## 2013-02-01 VITALS — BP 121/48 | HR 61 | Ht 65.0 in | Wt 102.8 lb

## 2013-02-01 DIAGNOSIS — R634 Abnormal weight loss: Secondary | ICD-10-CM

## 2013-02-01 DIAGNOSIS — I4891 Unspecified atrial fibrillation: Secondary | ICD-10-CM

## 2013-02-01 DIAGNOSIS — E785 Hyperlipidemia, unspecified: Secondary | ICD-10-CM

## 2013-02-01 DIAGNOSIS — I10 Essential (primary) hypertension: Secondary | ICD-10-CM

## 2013-02-01 NOTE — Patient Instructions (Addendum)
NO CHANGES WERE MADE TODAY WITH YOUR MEDICATIONS  PLEASE FOLLOW UP WITH [place] 05/01/13 @ 9:50

## 2013-02-01 NOTE — Progress Notes (Signed)
[address] [place], [place]  [address] Phone: [phone] Fax:  [phone]  Date:  02/01/2013   ID:  [name], DOB 09-06-1932, MRN [id number]  PCP:  Rene Paci, MD  Cardiologist:  Dr. Sherryl Manges     History of Present Illness: [name] is a 79 y.o. female returns for followup.  She has a hx of paroxysmal atrial fibrillation, HTN, DM2, HL, RBBB, mitral valve disease, COPD. Echo 07/2009: EF 55-60%, severely calcified mitral valve with restricted motion of the posterior leaflet, mean gradient 2, peak gradient 7, mild MR, mild LAE, PASP 42.  She was last seen by Dr. Graciela Husbands 09/2012. Discussion was held regarding switching to possibly Apixaban for anticoagulation. She was asked to follow up today. In the interim, she was admitted 9/8-9/12. She presented 3 days after falling. She reported multiple falls in the last month or so. INR was super-therapeutic.  She had a large left thigh hematoma and was hypotensive. She received 2 units of PRBCs. She developed atrial fibrillation and was seen by cardiology.  With rate control, she converted to NSR. Decision was made to stop anticoagulation due to high fall risk.  She is now staying at [facility].  She is here by herself.  She does not remember her last encounter with Dr. Sherryl Manges.  She tells me that she is going to d/c herself from [facility].  The patient denies chest pain, shortness of breath, syncope, orthopnea, PND or significant pedal edema.  She has frequent dizziness.   Labs (8/14):  HDL 45.5, LDL 47 Labs (9/14):  K 3.7, creatinine 0.43, ALT 13, Hgb 9.1 (nadir 7.4), TSH 1.090   Wt Readings from Last 3 Encounters:  02/01/13 102 lb 12.8 oz (46.63 kg)  01/24/13 112 lb 14 oz (51.2 kg)  09/27/12 116 lb (52.617 kg)     Past Medical History  Diagnosis Date  . HTN (hypertension)   . Hyperlipidemia   . Diabetes mellitus, type 2   . Paroxysmal atrial fibrillation     chronic anticoag - follows [facility]   . Right bundle branch block and incomplete left bundle branch block     with left axis deviation  . Chronic back pain     narcotic dependence  . Lumbar disc disease     dr. Dutch Quint  . Mitral valve prolapse   . Macular degeneration   . Gastritis     RUT negative, 2005  . Diverticulosis   . Alcohol abuse   . Compression fracture 02/2011    s/p KP  . Arthritis   . Depression   . Myocardial infarction   . COPD (chronic obstructive pulmonary disease)     Current Outpatient Prescriptions  Medication Sig Dispense Refill  . acetaminophen (TYLENOL) 325 MG tablet Take 2 tablets (650 mg total) by mouth every 6 (six) hours as needed.      Marland Kitchen b complex vitamins tablet Take 1 tablet by mouth daily.      . Cholecalciferol (VITAMIN D) 1000 UNITS capsule Take 1,000 Units by mouth daily.        . diazepam (VALIUM) 5 MG tablet Take 1 tablet (5 mg total) by mouth every 6 (six) hours as needed for anxiety.  30 tablet  0  . fentaNYL (DURAGESIC - DOSED MCG/HR) 50 MCG/HR Place 1 patch (50 mcg total) onto the skin every 3 (three) days.  10 patch  0  . glimepiride (AMARYL) 2 MG tablet Take 2 mg  by mouth 2 (two) times daily.      . Memantine HCl ER (NAMENDA XR) 14 MG CP24 Take 1 capsule by mouth daily.  30 capsule  5  . metFORMIN (GLUCOPHAGE) 500 MG tablet Take 500 mg by mouth 2 (two) times daily with a meal.      . metoprolol tartrate (LOPRESSOR) 25 MG tablet Take 12.5 mg by mouth 2 (two) times daily.      . Multiple Vitamin (MULTIVITAMIN WITH MINERALS) TABS tablet Take 1 tablet by mouth daily.      Marland Kitchen oxyCODONE-acetaminophen (PERCOCET/ROXICET) 5-325 MG per tablet Take one tablet by mouth every 4 hours as needed for pain  180 tablet  0  . PARoxetine (PAXIL) 40 MG tablet Take 40 mg by mouth every morning.      . pravastatin (PRAVACHOL) 80 MG tablet Take 80 mg by mouth daily.      . ramipril (ALTACE) 5 MG capsule Take 5 mg by mouth daily.      . temazepam (RESTORIL) 30 MG capsule Take 30 mg by mouth at  bedtime.        No current facility-administered medications for this visit.    Allergies:    Allergies  Allergen Reactions  . Codeine Other (See Comments)    unknown  . Morphine And Related Other (See Comments)    Pt had severe respiratory depression and excessive sedation during a recent hospitalization and was told it was 2/2 morphine.    Social History:  The patient  reports that she has been smoking Cigarettes.  She has a 32.5 pack-year smoking history. She has never used smokeless tobacco. She reports that  drinks alcohol. She reports that she does not use illicit drugs.   ROS:  Please see the history of present illness.   She has had a cough that is improved.   She notes significant weight loss.  All other systems reviewed and negative.   PHYSICAL EXAM: VS:  BP 121/48  Pulse 61  Ht 5\' 5"  (1.651 m)  Wt 102 lb 12.8 oz (46.63 kg)  BMI 17.11 kg/m2 Well nourished, well developed, in no acute distress HEENT: normal Neck: no JVD at 90 Cardiac:  normal S1, S2; RRR; no murmur Lungs:  clear to auscultation bilaterally, no wheezing, rhonchi or rales Abd: soft, nontender, no hepatomegaly Ext: no edema Skin: warm and dry Neuro:  CNs 2-12 intact, no focal abnormalities noted  EKG:  NSR, HR 61, interventricular conduction delay     ASSESSMENT AND PLAN:  1. Atrial Fibrillation:  Maintaining sinus rhythm. She is a high fall risk and has been taken off of Coumadin. I will review with Dr. Graciela Husbands whether or not we should consider placing her on aspirin. 2. Hypertension:  Controlled.  Continue current therapy.  3. Hyperlipidemia:  Continue statin. 4. Weight Loss: I reviewed recent studies done in the hospital. Metabolic panel was unremarkable. TSH was normal.  Chest x-ray was negative for any acute findings. She had a colonoscopy done in 2011 without recommended follow up. I recommended she follow up with her PCP for further evaluation and management. 5. Disposition:  Follow up with me or  Dr. Graciela Husbands in 3 months.  Signed, Tereso Newcomer, PA-C  02/01/2013 9:46 AM

## 2013-02-03 ENCOUNTER — Other Ambulatory Visit: Payer: Self-pay | Admitting: Internal Medicine

## 2013-02-04 ENCOUNTER — Non-Acute Institutional Stay (SKILLED_NURSING_FACILITY): Payer: Medicare Other | Admitting: Adult Health

## 2013-02-04 ENCOUNTER — Telehealth: Payer: Self-pay | Admitting: Internal Medicine

## 2013-02-04 DIAGNOSIS — E785 Hyperlipidemia, unspecified: Secondary | ICD-10-CM

## 2013-02-04 DIAGNOSIS — E559 Vitamin D deficiency, unspecified: Secondary | ICD-10-CM

## 2013-02-04 DIAGNOSIS — F0390 Unspecified dementia without behavioral disturbance: Secondary | ICD-10-CM

## 2013-02-04 DIAGNOSIS — D638 Anemia in other chronic diseases classified elsewhere: Secondary | ICD-10-CM

## 2013-02-04 DIAGNOSIS — I10 Essential (primary) hypertension: Secondary | ICD-10-CM

## 2013-02-04 DIAGNOSIS — G4700 Insomnia, unspecified: Secondary | ICD-10-CM

## 2013-02-04 DIAGNOSIS — I4891 Unspecified atrial fibrillation: Secondary | ICD-10-CM

## 2013-02-04 DIAGNOSIS — F329 Major depressive disorder, single episode, unspecified: Secondary | ICD-10-CM

## 2013-02-04 NOTE — Telephone Encounter (Signed)
I spoke with the pt's husband and advised that the pt should remain off of Coumadin and continue Pravastatin.

## 2013-02-04 NOTE — Telephone Encounter (Signed)
Pt's husband is calling with questions about the pts meds. Mr Neidert states the hospital recently took the pt off of coumadin and her cholosterol meds were changed as well. Pt's husband would like to be advised on the medication changes. Should they stay the same or should they be changed back.

## 2013-02-06 ENCOUNTER — Telehealth: Payer: Self-pay | Admitting: *Deleted

## 2013-02-06 MED ORDER — MEMANTINE HCL 5 MG PO TABS: 5.0000 mg | ORAL_TABLET | Freq: Two times a day (BID) | ORAL | Status: DC

## 2013-02-06 NOTE — Telephone Encounter (Signed)
Faxed request back with md response...lmb

## 2013-02-06 NOTE — Telephone Encounter (Signed)
Okay to use short acting Namenda 5 mg twice a day until extended release back in stock Erx done

## 2013-02-06 NOTE — Telephone Encounter (Signed)
Received fax stating Namenda XL is on back order. Pt is wanting Immediate release instead...lmb

## 2013-02-08 ENCOUNTER — Telehealth: Payer: Self-pay | Admitting: *Deleted

## 2013-02-08 ENCOUNTER — Encounter: Payer: Self-pay | Admitting: Internal Medicine

## 2013-02-08 ENCOUNTER — Ambulatory Visit (INDEPENDENT_AMBULATORY_CARE_PROVIDER_SITE_OTHER): Payer: Medicare Other | Admitting: Internal Medicine

## 2013-02-08 VITALS — BP 110/54 | HR 69 | Temp 97.0°F | Wt 104.8 lb

## 2013-02-08 DIAGNOSIS — IMO0001 Reserved for inherently not codable concepts without codable children: Secondary | ICD-10-CM

## 2013-02-08 DIAGNOSIS — R634 Abnormal weight loss: Secondary | ICD-10-CM

## 2013-02-08 DIAGNOSIS — I4891 Unspecified atrial fibrillation: Secondary | ICD-10-CM

## 2013-02-08 DIAGNOSIS — Z23 Encounter for immunization: Secondary | ICD-10-CM

## 2013-02-08 DIAGNOSIS — R413 Other amnesia: Secondary | ICD-10-CM

## 2013-02-08 HISTORY — DX: Abnormal weight loss: R63.4

## 2013-02-08 MED ORDER — POLYETHYLENE GLYCOL 3350 17 GM/SCOOP PO POWD: 17.0000 g | Freq: Every day | ORAL | Status: AC

## 2013-02-08 MED ORDER — ASPIRIN EC 81 MG PO TBEC: 81.0000 mg | DELAYED_RELEASE_TABLET | Freq: Every day | ORAL | Status: DC

## 2013-02-08 NOTE — Patient Instructions (Signed)
It was good to see you today. We have reviewed your prior records including labs and tests today Medications reviewed and updated -  Take aspirin 81mg  daily for stroke prevention now that you are off of coumadin Continue the Namenda -short acting or XR - for memory -  Refill on medication(s) as discussed today. Please schedule followup in 4 months, call sooner if problems.

## 2013-02-08 NOTE — Progress Notes (Signed)
Subjective:    Patient ID: [name], female    DOB: Mar 18, 1933, 79 y.o.   MRN: [id number]  HPI here for hospital follow up - Admit date: 01/21/2013  Discharge date: 01/25/2013  Time spent: 45 minutes  Recommendations for Outpatient Follow-up:  -Will be discharged to SNF today.  -Warfarin has been discontinued due to fall risk.  Discharge Diagnoses:  Principal Problem:  Fall  Active Problems:  DIABETES MELLITUS, TYPE II, UNCONTROLLED  VITAMIN D DEFICIENCY  HYPERLIPIDEMIA  ANXIETY DEPRESSION  Atrial fibrillation  Memory loss  Hematoma of left thigh  Supratherapeutic INR  Anemia of chronic disease  Leukocytosis  HTN (hypertension)   Now home after short stay at [facility] place  also reviewed chronic medical issues:  Dementia, progressive memory problems. Begin Namenda April 2014 - prior spouse, no significant change in behavior, remains argumentative and repetitive. Potential side effects related to same including diarrhea, increased thirst and dizziness  OAB - started vesicar 03/2012, changed to Detrol LA 05/2012 -initially improved, but persisting symptoms -  diabetes mellitus - infrequently check cbgs - stopped amaryl 03/2012 due to symptomatic hypoglycemia - fasting cbgs 114-140  hypertension - reviewed home BP log - variable - the patient reports compliance with medication(s) as prescribed. Denies adverse side effects.   Past Medical History  Diagnosis Date  . HTN (hypertension)   . Hyperlipidemia   . Diabetes mellitus, type 2   . Paroxysmal atrial fibrillation     chronic anticoag until 01/2013 - stopped due to high fall risk  . Right bundle branch block and incomplete left bundle branch block     with left axis deviation  . Chronic back pain     narcotic dependence  . Lumbar disc disease     dr. Dutch Quint  . Mitral valve prolapse   . Macular degeneration   . Gastritis     RUT negative, 2005  . Diverticulosis   . Alcohol abuse   . Compression fracture 02/2011     s/p KP  . Arthritis   . Depression   . Myocardial infarction   . COPD (chronic obstructive pulmonary disease)      Review of Systems  Constitutional: Negative for fever, activity change and appetite change.  Respiratory: Negative for cough, chest tightness, shortness of breath and wheezing.   Cardiovascular: Negative for chest pain and palpitations.  Gastrointestinal: Positive for constipation. Negative for abdominal pain and blood in stool.  Genitourinary: Positive for urgency and frequency. Negative for dysuria, hematuria, flank pain and pelvic pain.  Musculoskeletal: Positive for myalgias and back pain (chronic).  Neurological: Negative for dizziness and headaches.  Psychiatric/Behavioral: Positive for decreased concentration. The patient is nervous/anxious (chronic).        Objective:   Physical Exam BP 110/54  Pulse 69  Temp(Src) 97 F (36.1 C) (Oral)  Wt 104 lb 12.8 oz (47.537 kg)  BMI 17.44 kg/m2  SpO2 97% Wt Readings from Last 3 Encounters:  02/08/13 104 lb 12.8 oz (47.537 kg)  02/01/13 102 lb 12.8 oz (46.63 kg)  01/24/13 112 lb 14 oz (51.2 kg)    Gen: NAD - tremulous and kyphotic without change -  Lungs: CTA B CV: irreg irreg, no edema Abdomen: firm, NTND, +BS - rectal deferred Neuro: AAOx2, no CN deficits - MAE and gait/balance unchanged Psychiatric - rambling and tangential - oriented to person, place but poor short term recall (0/3 at 5 min)  Lab Results  Component Value Date   WBC 9.0 01/25/2013  HGB 9.1* 01/25/2013   HCT 26.3* 01/25/2013   PLT 169 01/25/2013   GLUCOSE 115* 01/22/2013   CHOL 111 01/09/2013   TRIG 93.0 01/09/2013   HDL 45.50 01/09/2013   LDLCALC 47 01/09/2013   ALT 13 01/22/2013   AST 29 01/22/2013   NA 135 01/22/2013   K 3.7 01/22/2013   CL 99 01/22/2013   CREATININE 0.43* 01/22/2013   BUN 9 01/22/2013   CO2 27 01/22/2013   TSH 1.090 01/21/2013   INR 2.23* 01/22/2013   HGBA1C 5.9 01/09/2013   MICROALBUR 0.1 SUPPR 01/09/2013       Assessment & Plan:    See problem list. Medications and labs reviewed today.  Time spent with pt/family today 25 minutes, greater than 50% time spent counseling patient on recent fall, diabetes, AF (now off coumadin), chronic pain/dizziness and medication review. Also review of prior records

## 2013-02-08 NOTE — Assessment & Plan Note (Signed)
On metformin solo, dose increase early 05/2012 previously stopped amaryl for dizziness and hypoglycemia symptoms in 02/2012 Spouse reports home cbgs 90-120 fasting since stopping OHA 02/2012, reassurance provided  Lab Results  Component Value Date   HGBA1C 5.9 01/09/2013

## 2013-02-08 NOTE — Assessment & Plan Note (Signed)
PAF, on CCB  stopped coumadin 01/2013 due to high fall risk Will start aspirin 81mg  qd now Continue working with EPas ongoing

## 2013-02-08 NOTE — Assessment & Plan Note (Signed)
Progressive decline - complicated by chronic high dose narcotics/BZs Check screening labs now began namenda xr 09/2012 - ?benefit vs side effects -  encouraged to continue same for now  

## 2013-02-08 NOTE — Telephone Encounter (Signed)
Joe called says thank you Dr Felicity Coyer for answering all questions today and for stopping medication.  Thank you again

## 2013-02-08 NOTE — Assessment & Plan Note (Signed)
Wt Readings from Last 3 Encounters:  02/08/13 104 lb 12.8 oz (47.537 kg)  02/01/13 102 lb 12.8 oz (46.63 kg)  01/24/13 112 lb 14 oz (51.2 kg)   continued unintentional weight loss in elderly smoker reviewed CXR 01/21/13 unremarkable mammo 09/2012 unremarkable CT a/p 05/15/12 with stable L adrenal adenoma (benign per MRI eval 07/2009) TSH 01/2013 normal Colo 02/2010 -hyperplastic polyp on path - no follow up planned due to age

## 2013-02-11 ENCOUNTER — Telehealth: Payer: Self-pay | Admitting: *Deleted

## 2013-02-11 NOTE — Telephone Encounter (Signed)
Pt called states she is experiencing frequent urination.  Pt further states she has been taking Detrol but medication causes mouth and tongue dryness..  Pt requesting something else be called in to pharmacy. Please advise

## 2013-02-11 NOTE — Telephone Encounter (Signed)
Spoke with pt advised of MDs message 

## 2013-02-11 NOTE — Telephone Encounter (Signed)
We have tried all other medications for control of frequent urination, so no new medication recommended at this time. I would suggest patient try taking Detrol every other day

## 2013-02-20 DIAGNOSIS — I10 Essential (primary) hypertension: Secondary | ICD-10-CM

## 2013-02-20 HISTORY — DX: Essential (primary) hypertension: I10

## 2013-02-20 NOTE — Progress Notes (Signed)
Patient ID: [name], female   DOB: Sep 02, 1932, 79 y.o.   MRN: [id number]       PROGRESS NOTE  DATE: 02/04/2013   FACILITY: [facility]  LEVEL OF CARE: SNF (31)  Acute Visit  CHIEF COMPLAINT: Discharge Notes  HISTORY OF PRESENT ILLNESS:This is a 79 year old female who is for discharge home per patient and husband's request. Home health has been declined. She has been admitted to [facility] on 01/25/13 from [facility]. Patient has been having multiple falls at home. Patient was admitted to this facility for short-term rehabilitation after the patient's recent hospitalization.   Reassessment of ongoing problem(s):  DEMENTIA: The dementia remaines stable and continues to function adequately in the current living environment with supervision.  The patient has had little changes in behavior. No complications noted from the medications presently being used.  ATRIAL FIBRILLATION: the patients atrial fibrillation remains stable.  The patient denies DOE, tachycardia, orthopnea, transient neurological sx, pedal edema, palpitations, & PNDs.  No complications noted from the medications currently being used.  HTN: Pt 's HTN remains stable.  Denies CP, sob, DOE, pedal edema, headaches, dizziness or visual disturbances.  No complications from the medications currently being used.  Last BP : 114/71   PAST MEDICAL HISTORY : Reviewed.  No changes.  CURRENT MEDICATIONS: Reviewed per [facility]  REVIEW OF SYSTEMS:  GENERAL: no change in appetite, no fatigue, no weight changes, no fever, chills or weakness RESPIRATORY: no cough, SOB, DOE, wheezing, hemoptysis CARDIAC: no chest pain, edema or palpitations GI: no abdominal pain, diarrhea, constipation, heart burn, nausea or vomiting  PHYSICAL EXAMINATION  VS:  T98.5       P70       RR20      BP114/71            WT104.2 (Lb)  GENERAL: no acute distress, normal body habitus EYES: conjunctivae normal, sclerae normal, normal eye  lids NECK: supple, trachea midline, no neck masses, no thyroid tenderness, no thyromegaly RESPIRATORY: breathing is even & unlabored, BS CTAB CARDIAC: RRR, no murmur,no extra heart sounds, no edema GI: abdomen soft, normal BS, no masses, no tenderness, no hepatomegaly, no splenomegaly PSYCHIATRIC: the patient is alert & oriented to person, affect & behavior appropriate  LABS/RADIOLOGY: 01/25/13 WBC 9.0 hemoglobin 9.1 hematocrit 26.3 01/22/13 sodium 135 potassium 3.7 glucose 115 BUN 9 creatinine 0.43 calcium 8.8 magnesium 2.1 AST 29 ALT 13 albumin 2.6 protein 6.2  ASSESSMENT/PLAN:  Atrial fibrillation - rate controlled; Coumadin was discontinued in the hospital due to high risk for falls; continue Calan  Diabetes mellitus, type II - well controlled; continue Glucophage and Amaryl  Anemia of chronic disease - stable  Dementia - stable; continue Namenda  Depression - stable; continue Paxil  Vitamin D deficiency  - continue supplementation  Insomnia - No complaints; continue Restoril  Hyperlipidemia  - continue Pravachol   Hypertension - well-controlled; continue  I have filled out patient's discharge paperwork and written prescriptions.    Total discharge time: Less than 30 minutes Discharge time involved coordination of the discharge process with Child psychotherapist, nursing staff and therapy department.   CPT CODE: 40981

## 2013-02-27 ENCOUNTER — Telehealth: Payer: Self-pay | Admitting: *Deleted

## 2013-02-27 MED ORDER — OXYCODONE-ACETAMINOPHEN 10-325 MG PO TABS: 1.0000 | ORAL_TABLET | Freq: Four times a day (QID) | ORAL | Status: DC | PRN

## 2013-02-27 NOTE — Telephone Encounter (Signed)
Pt called requesting Hydrocodone refill.  Please advise 

## 2013-02-27 NOTE — Telephone Encounter (Signed)
Spoke with pt advised Rx ready. 

## 2013-02-27 NOTE — Telephone Encounter (Signed)
Patient takes oxycodone, not hydrocodone.   prescription for oxycodone printed and signed

## 2013-03-05 DIAGNOSIS — E119 Type 2 diabetes mellitus without complications: Secondary | ICD-10-CM

## 2013-03-05 DIAGNOSIS — D62 Acute posthemorrhagic anemia: Secondary | ICD-10-CM | POA: Insufficient documentation

## 2013-03-05 HISTORY — DX: Type 2 diabetes mellitus without complications: E11.9

## 2013-03-05 NOTE — Progress Notes (Signed)
Patient ID: [name], female   DOB: 05/27/1932, 79 y.o.   MRN: [id number]        HISTORY & PHYSICAL  DATE: 01/30/2013   FACILITY: [facility]  LEVEL OF CARE: SNF (31)  ALLERGIES:  Allergies  Allergen Reactions  . Codeine Other (See Comments)    unknown  . Morphine And Related Other (See Comments)    Pt had severe respiratory depression and excessive sedation during a recent hospitalization and was told it was 2/2 morphine.    CHIEF COMPLAINT:  Manage diabetes mellitus, atrial fibrillation, and hypertension.    HISTORY OF PRESENT ILLNESS:  The patient is a 79 year-old, Caucasian female who was hospitalized secondary to recurrent falls and, after hospitalization, admitted to this facility for short-term rehabilitation.  She has the following problems:    ATRIAL FIBRILLATION: the patients atrial fibrillation remains stable.  The patient denies DOE, tachycardia, orthopnea, transient neurological sx, pedal edema, palpitations, & PNDs.  No complications noted from the medications currently being used.    DM:pt's DM remains stable.  Pt denies polyuria, polydipsia, polyphagia, changes in vision or hypoglycemic episodes.  No complications noted from the medication presently being used.  Last hemoglobin A1c is:  Not available.    HTN: Pt 's HTN remains stable.  Denies CP, sob, DOE, pedal edema, headaches, dizziness or visual disturbances.  No complications from the medications currently being used.  Last BP :  114/71.    PAST MEDICAL HISTORY :  Past Medical History  Diagnosis Date  . HTN (hypertension)   . Hyperlipidemia   . Diabetes mellitus, type 2   . Paroxysmal atrial fibrillation     chronic anticoag until 01/2013 - stopped due to high fall risk  . Right bundle branch block and incomplete left bundle branch block     with left axis deviation  . Chronic back pain     narcotic dependence  . Lumbar disc disease     dr. Dutch Quint  . Mitral valve prolapse   . Macular  degeneration   . Gastritis     RUT negative, 2005  . Diverticulosis   . Alcohol abuse   . Compression fracture 02/2011    s/p KP  . Arthritis   . Depression   . Myocardial infarction   . COPD (chronic obstructive pulmonary disease)     PAST SURGICAL HISTORY: Past Surgical History  Procedure Laterality Date  . Right ankle surgery    . Knee arthroscopy      SOCIAL HISTORY:  reports that she has been smoking Cigarettes.  She has a 32.5 pack-year smoking history. She has never used smokeless tobacco. She reports that she drinks alcohol. She reports that she does not use illicit drugs.  FAMILY HISTORY:  Family History  Problem Relation Age of Onset  . Heart disease Mother   . Cancer Father     throat  . Atrial fibrillation Sister     CURRENT MEDICATIONS: Reviewed per [facility]  REVIEW OF SYSTEMS:   MUSCULOSKELETAL:  Complains of back pain.    See HPI otherwise 14 point ROS is negative.  PHYSICAL EXAMINATION  VS:  T 98.5       P 70      RR 20      BP 114/71      POX%        WT (Lb)  GENERAL: no acute distress, thin body habitus EYES: conjunctivae normal, sclerae normal, normal eye lids MOUTH/THROAT: lips without  lesions,no lesions in the mouth,tongue is without lesions,uvula elevates in midline NECK: supple, trachea midline, no neck masses, no thyroid tenderness, no thyromegaly LYMPHATICS: no LAN in the neck, no supraclavicular LAN RESPIRATORY: breathing is even & unlabored, BS CTAB CARDIAC: RRR, no murmur,no extra heart sounds, no edema GI:  ABDOMEN: abdomen soft, normal BS, no masses, no tenderness  LIVER/SPLEEN: no hepatomegaly, no splenomegaly MUSCULOSKELETAL: HEAD: normal to inspection & palpation BACK: no kyphosis, scoliosis or spinal processes tenderness EXTREMITIES: LEFT UPPER EXTREMITY: full range of motion, normal strength & tone RIGHT UPPER EXTREMITY:  full range of motion, normal strength & tone LEFT LOWER EXTREMITY:  full range of motion, normal strength &  tone RIGHT LOWER EXTREMITY:  full range of motion, normal strength & tone PSYCHIATRIC: the patient is alert & oriented to person, affect & behavior appropriate  LABS/RADIOLOGY: Left hip and left knee x-ray:  Did not reveal acute fractures.    Chest x-ray:  No acute disease.    Urine culture showed no growth.    MRSA by PCR negative.     Glucose 115, otherwise BMP normal.    Magnesium 2.1, phosphorus 4.5.    Liver profile normal.    Hemoglobin 9.1, MCV 96.7, otherwise CBC normal.    Troponin-I less than 0.03 x3.    Total CK 502.    ASSESSMENT/PLAN:  Diabetes mellitus.  Continue current medications.    Atrial fibrillation.  Rate controlled.    Hypertension.  Well controlled.    Acute blood loss anemia.  Status post transfusion.  Reassess hemoglobin level.    Dementia.  Stable.    Check CBC and BMP.    Back pain.  Uncontrolled problem.   Increase Percocet 5/325 to q.4 p.r.n.     I have reviewed patient's medical records received at admission/from hospitalization.  CPT CODE: 13086

## 2013-04-01 ENCOUNTER — Other Ambulatory Visit: Payer: Self-pay | Admitting: Internal Medicine

## 2013-04-01 ENCOUNTER — Telehealth: Payer: Self-pay | Admitting: *Deleted

## 2013-04-01 MED ORDER — OXYCODONE-ACETAMINOPHEN 10-325 MG PO TABS: 1.0000 | ORAL_TABLET | Freq: Four times a day (QID) | ORAL | Status: DC | PRN

## 2013-04-01 NOTE — Telephone Encounter (Signed)
Pt notified to p/u

## 2013-04-01 NOTE — Telephone Encounter (Signed)
She needs to pick up the prescription

## 2013-04-01 NOTE — Telephone Encounter (Signed)
Pt called requesting Oxycodone refill, states she is completely.  Please advise in Dr Diamantina Monks absence.

## 2013-04-10 ENCOUNTER — Other Ambulatory Visit: Payer: Self-pay | Admitting: Internal Medicine

## 2013-04-26 ENCOUNTER — Telehealth: Payer: Self-pay | Admitting: *Deleted

## 2013-04-26 NOTE — Telephone Encounter (Signed)
Pts husband called requesting Oxycodone and Fentanyl patch refill.  Please advise

## 2013-04-26 NOTE — Telephone Encounter (Signed)
MD out of office. Will hold until she return on Monday...lmb

## 2013-04-29 MED ORDER — FENTANYL 50 MCG/HR TD PT72: 50.0000 ug | MEDICATED_PATCH | TRANSDERMAL | Status: DC

## 2013-04-29 MED ORDER — OXYCODONE-ACETAMINOPHEN 10-325 MG PO TABS: 1.0000 | ORAL_TABLET | Freq: Four times a day (QID) | ORAL | Status: DC | PRN

## 2013-04-29 NOTE — Telephone Encounter (Signed)
left message Rx ready

## 2013-04-29 NOTE — Telephone Encounter (Signed)
Ok -printed and signed 

## 2013-05-01 ENCOUNTER — Ambulatory Visit: Payer: Medicare Other | Admitting: Physician Assistant

## 2013-05-10 ENCOUNTER — Other Ambulatory Visit: Payer: Self-pay | Admitting: *Deleted

## 2013-05-10 MED ORDER — DIAZEPAM 5 MG PO TABS: 5.0000 mg | ORAL_TABLET | Freq: Four times a day (QID) | ORAL | Status: DC | PRN

## 2013-05-10 NOTE — Telephone Encounter (Signed)
Faxed script back to [facility].../lmb 

## 2013-05-24 ENCOUNTER — Encounter: Payer: Self-pay | Admitting: Internal Medicine

## 2013-05-24 ENCOUNTER — Other Ambulatory Visit (INDEPENDENT_AMBULATORY_CARE_PROVIDER_SITE_OTHER): Payer: Medicare Other

## 2013-05-24 ENCOUNTER — Ambulatory Visit (INDEPENDENT_AMBULATORY_CARE_PROVIDER_SITE_OTHER): Payer: Medicare Other | Admitting: Internal Medicine

## 2013-05-24 VITALS — BP 110/52 | HR 65 | Temp 97.8°F | Wt 105.0 lb

## 2013-05-24 DIAGNOSIS — F0390 Unspecified dementia without behavioral disturbance: Secondary | ICD-10-CM

## 2013-05-24 DIAGNOSIS — E119 Type 2 diabetes mellitus without complications: Secondary | ICD-10-CM

## 2013-05-24 DIAGNOSIS — I4891 Unspecified atrial fibrillation: Secondary | ICD-10-CM

## 2013-05-24 DIAGNOSIS — M545 Low back pain, unspecified: Secondary | ICD-10-CM

## 2013-05-24 LAB — BASIC METABOLIC PANEL
BUN: 13 mg/dL (ref 6–23)
CALCIUM: 9.3 mg/dL (ref 8.4–10.5)
CO2: 30 meq/L (ref 19–32)
Chloride: 99 mEq/L (ref 96–112)
Creatinine, Ser: 0.6 mg/dL (ref 0.4–1.2)
GFR: 104.76 mL/min (ref >60.00)
GLUCOSE: 81 mg/dL (ref 70–99)
Potassium: 4.6 mEq/L (ref 3.5–5.1)
Sodium: 135 mEq/L (ref 135–145)

## 2013-05-24 LAB — HEMOGLOBIN A1C: Hgb A1c MFr Bld: 6.2 % (ref 4.6–6.5)

## 2013-05-24 MED ORDER — FENTANYL 50 MCG/HR TD PT72: 50.0000 ug | MEDICATED_PATCH | TRANSDERMAL | Status: DC

## 2013-05-24 MED ORDER — OXYCODONE-ACETAMINOPHEN 10-325 MG PO TABS: 1.0000 | ORAL_TABLET | Freq: Four times a day (QID) | ORAL | Status: DC | PRN

## 2013-05-24 MED ORDER — DIAZEPAM 5 MG PO TABS: 5.0000 mg | ORAL_TABLET | Freq: Four times a day (QID) | ORAL | Status: DC | PRN

## 2013-05-24 NOTE — Progress Notes (Signed)
Pre-visit discussion using our clinic review tool. No additional management support is needed unless otherwise documented below in the visit note.  

## 2013-05-24 NOTE — Assessment & Plan Note (Signed)
Progressive decline - complicated by chronic high dose narcotics/BZs began namenda xr 09/2012 - ?benefit vs side effects -  encouraged to continue same for now

## 2013-05-24 NOTE — Assessment & Plan Note (Addendum)
PAF, on CCB  Stopped coumadin fall 2014 after fall wen felt risk>benefit Continue ASA 81 qd

## 2013-05-24 NOTE — Progress Notes (Signed)
Subjective:    Patient ID: [name], female    DOB: Oct 24, 1932, 80 y.o.   MRN: [id number]  HPI here for follow up - reviewed chronic medical issues:  Dementia, progressive memory problems. Begin Namenda April 2014 - per spouse (who sends written note accompanies pt today), no significant change in behavior, remains argumentative and repetitive. Potential side effects related to same including diarrhea, increased thirst and dizziness  OAB - started vesicar 03/2012, changed to Detrol LA 05/2012 -initially improved, but persisting symptoms -  diabetes mellitus - infrequently check cbgs - stopped amaryl 03/2012 due to symptomatic hypoglycemia - fasting cbgs 114-140  hypertension - reviewed home BP log - variable - the patient reports compliance with medication(s) as prescribed. Denies adverse side effects.   Past Medical History  Diagnosis Date  . HTN (hypertension)   . Hyperlipidemia   . Diabetes mellitus, type 2   . Paroxysmal atrial fibrillation     chronic anticoag until 01/2013 - stopped due to high fall risk  . Right bundle branch block and incomplete left bundle branch block     with left axis deviation  . Chronic back pain     narcotic dependence  . Lumbar disc disease     dr. Trenton Gammon  . Mitral valve prolapse   . Macular degeneration   . Gastritis     RUT negative, 2005  . Diverticulosis   . Alcohol abuse   . Compression fracture 02/2011    s/p KP  . Arthritis   . Depression   . Myocardial infarction   . COPD (chronic obstructive pulmonary disease)      Review of Systems  Constitutional: Negative for fever, activity change and appetite change.  Respiratory: Negative for cough, chest tightness, shortness of breath and wheezing.   Cardiovascular: Negative for chest pain and palpitations.  Gastrointestinal: Positive for constipation. Negative for abdominal pain and blood in stool.  Musculoskeletal: Positive for back pain (chronic) and myalgias.  Neurological:  Negative for dizziness and headaches.  Psychiatric/Behavioral: Positive for decreased concentration. The patient is nervous/anxious (chronic).        Objective:   Physical Exam BP 110/52  Pulse 65  Temp(Src) 97.8 F (36.6 C) (Oral)  Wt 105 lb (47.628 kg)  SpO2 92% Wt Readings from Last 3 Encounters:  05/24/13 105 lb (47.628 kg)  02/08/13 104 lb 12.8 oz (47.537 kg)  02/01/13 102 lb 12.8 oz (46.63 kg)    Gen: NAD - tremulous and kyphotic without change -  Lungs: CTA B CV: irreg irreg, no edema Abdomen: firm, NTND, +BS  Neuro: AAOx2, no CN deficits - MAE and gait/balance not examined today Psychiatric - rambling and tangential - oriented to person, place but poor short term recall (0/3 at 5 min)  Lab Results  Component Value Date   WBC 9.0 01/25/2013   HGB 9.1* 01/25/2013   HCT 26.3* 01/25/2013   PLT 169 01/25/2013   GLUCOSE 115* 01/22/2013   CHOL 111 01/09/2013   TRIG 93.0 01/09/2013   HDL 45.50 01/09/2013   LDLCALC 47 01/09/2013   ALT 13 01/22/2013   AST 29 01/22/2013   NA 135 01/22/2013   K 3.7 01/22/2013   CL 99 01/22/2013   CREATININE 0.43* 01/22/2013   BUN 9 01/22/2013   CO2 27 01/22/2013   TSH 1.090 01/21/2013   INR 2.23* 01/22/2013   HGBA1C 5.9 01/09/2013   MICROALBUR 0.1 SUPPR 01/09/2013       Assessment & Plan:   See problem list.  Medications and labs reviewed today.

## 2013-05-24 NOTE — Assessment & Plan Note (Signed)
Chronic problems - high narcotics maintained for same hx vert compression fx and VP 08/2010 -  So long as no escalating dose or change in pain, i will continue to fill medications as ongoing Requests higher dose of narcotics which I declined, but offered refer to pain mgmt. She declines referal to pain clinic: " i have tried those clinics before and they don't work"

## 2013-05-24 NOTE — Patient Instructions (Addendum)
It was good to see you today.  We have reviewed your prior records including labs and tests today  Medications reviewed and updated - no changes recommended  Refill on medication(s) as discussed today.  I am unable to think of any way to increase your pain control other than sending you to a pain clinic - please let me know if you would like a referral to pain management for this  Please schedule followup in 4 months, call sooner if problems  Diabetes and Standards of Medical Care  Diabetes is complicated. You may find that your diabetes team includes a dietitian, nurse, diabetes educator, eye doctor, and more. To help everyone know what is going on and to help you get the care you deserve, the following schedule of care was developed to help keep you on track. Below are the tests, exams, vaccines, medicines, education, and plans you will need. HbA1c test This test shows how well you have controlled your glucose over the past 2 3 months. It is used to see if your diabetes management plan needs to be adjusted.   It is performed at least 2 times a year if you are meeting treatment goals.  It is performed 4 times a year if therapy has changed or if you are not meeting treatment goals. Blood pressure test  This test is performed at every routine medical visit. The goal is less than 140/90 mmHg for most people, but 130/80 mmHg in some cases. Ask your health care provider about your goal. Dental exam  Follow up with the dentist regularly. Eye exam  If you are diagnosed with type 1 diabetes as a child, get an exam upon reaching the age of 14 years or older and have had diabetes for 3 5 years. Yearly eye exams are recommended after that initial eye exam.  If you are diagnosed with type 1 diabetes as an adult, get an exam within 5 years of diagnosis and then yearly.  If you are diagnosed with type 2 diabetes, get an exam as soon as possible after the diagnosis and then yearly. Foot care  exam  Visual foot exams are performed at every routine medical visit. The exams check for cuts, injuries, or other problems with the feet.  A comprehensive foot exam should be done yearly. This includes visual inspection as well as assessing foot pulses and testing for loss of sensation.  Check your feet nightly for cuts, injuries, or other problems with your feet. Tell your health care provider if anything is not healing. Kidney function test (urine microalbumin)  This test is performed once a year.  Type 1 diabetes: The first test is performed 5 years after diagnosis.  Type 2 diabetes: The first test is performed at the time of diagnosis.  A serum creatinine and estimated glomerular filtration rate (eGFR) test is done once a year to assess the level of chronic kidney disease (CKD), if present. Lipid profile (cholesterol, HDL, LDL, triglycerides)  Performed every 5 years for most people.  The goal for LDL is less than 100 mg/dL. If you are at high risk, the goal is less than 70 mg/dL.  The goal for HDL is 40 mg/dL 50 mg/dL for men and 50 mg/dL 60 mg/dL for women. An HDL cholesterol of 60 mg/dL or higher gives some protection against heart disease.  The goal for triglycerides is less than 150 mg/dL. Influenza vaccine, pneumococcal vaccine, and hepatitis B vaccine  The influenza vaccine is recommended yearly.  The pneumococcal  vaccine is generally given once in a lifetime. However, there are some instances when another vaccination is recommended. Check with your health care provider.  The hepatitis B vaccine is also recommended for adults with diabetes. Diabetes self-management education  Education is recommended at diagnosis and ongoing as needed. Treatment plan  Your treatment plan is reviewed at every medical visit. Document Released: 02/27/2009 Document Revised: 01/02/2013 Document Reviewed: 10/02/2012 [facility] Patient Information 2014 [employer].

## 2013-05-24 NOTE — Assessment & Plan Note (Signed)
On metformin solo, dose increase early 05/2012 previously stopped amaryl for dizziness and hypoglycemia symptoms in 02/2012 Spouse reports home cbgs 90-120 fasting since stopping OHA 02/2012, reassurance provided Check a1c q 47mo  Lab Results  Component Value Date   HGBA1C 5.9 01/09/2013

## 2013-05-24 NOTE — Assessment & Plan Note (Signed)
Chronic problems - narcotics maintained for same So long as no escalating dose or change in pain, i will continue to fill medications as ongoing - refills today hx vert compression fx and VP 08/2010 -  She declines referal to pain clinic: " i have tried those clinics before and they don't work"

## 2013-06-03 ENCOUNTER — Telehealth: Payer: Self-pay | Admitting: *Deleted

## 2013-06-03 MED ORDER — OXYCODONE-ACETAMINOPHEN 10-325 MG PO TABS: 1.0000 | ORAL_TABLET | Freq: Four times a day (QID) | ORAL | Status: DC | PRN

## 2013-06-03 NOTE — Telephone Encounter (Signed)
Called pharmacy spoke with christine inform her to void rx for #30 that was left. Pt is coming to pick up correct script for # 120. Pt also was notified...[name]

## 2013-06-03 NOTE — Telephone Encounter (Signed)
Left msg on vm stating her oxycodone rx was written wong only received # 30...[name]

## 2013-06-03 NOTE — Telephone Encounter (Signed)
My error - New rx signed for #120 - Please notify pharmacy ok to fill despite my label "do not fill more than once every 30d"

## 2013-06-10 ENCOUNTER — Ambulatory Visit: Payer: Medicare Other | Admitting: Internal Medicine

## 2013-06-13 ENCOUNTER — Other Ambulatory Visit: Payer: Self-pay | Admitting: Internal Medicine

## 2013-06-14 ENCOUNTER — Ambulatory Visit: Payer: Medicare Other | Admitting: Internal Medicine

## 2013-06-18 ENCOUNTER — Ambulatory Visit: Payer: Medicare Other | Admitting: Internal Medicine

## 2013-06-21 ENCOUNTER — Other Ambulatory Visit: Payer: Self-pay | Admitting: Internal Medicine

## 2013-06-21 NOTE — Telephone Encounter (Signed)
Last prescribed 01/2013 with 3 refills--please advise

## 2013-06-26 ENCOUNTER — Ambulatory Visit: Payer: Medicare Other | Admitting: Internal Medicine

## 2013-06-27 ENCOUNTER — Telehealth: Payer: Self-pay | Admitting: *Deleted

## 2013-06-27 NOTE — Telephone Encounter (Signed)
Patient phoned requesting refills on her oxycodone, diazepam, and her fentanyl patches.  Last OV with PCP and last fill dates 05/24/13.  Please advise-no controlled substance contract on file.   CB# [id number]

## 2013-06-29 ENCOUNTER — Other Ambulatory Visit: Payer: Self-pay | Admitting: Internal Medicine

## 2013-07-01 MED ORDER — DIAZEPAM 5 MG PO TABS: 5.0000 mg | ORAL_TABLET | Freq: Four times a day (QID) | ORAL | Status: DC | PRN

## 2013-07-01 MED ORDER — FENTANYL 50 MCG/HR TD PT72: 50.0000 ug | MEDICATED_PATCH | TRANSDERMAL | Status: DC

## 2013-07-01 MED ORDER — OXYCODONE-ACETAMINOPHEN 10-325 MG PO TABS: 1.0000 | ORAL_TABLET | Freq: Four times a day (QID) | ORAL | Status: DC | PRN

## 2013-07-01 NOTE — Telephone Encounter (Signed)
Scripts printed & awaiting MD signature and substance control contract printed.  Patient notified and controlled substance contract explained to patient. States she is too sick to get out in this weather, but is going to try to come.  Further states her only means of being able to be transported is via ambulance.  Consulted PCP, she is to get here by any means necessary.  Verbalizes understanding and appreciation. Scripts and contract in refill bin.

## 2013-07-01 NOTE — Telephone Encounter (Signed)
Ok - note need for screening with [facility] prior to picking up refill  

## 2013-07-08 ENCOUNTER — Other Ambulatory Visit: Payer: Self-pay | Admitting: Internal Medicine

## 2013-07-12 ENCOUNTER — Other Ambulatory Visit: Payer: Self-pay | Admitting: Internal Medicine

## 2013-07-12 NOTE — Telephone Encounter (Signed)
Faxed script back to [facility].../lmb 

## 2013-07-23 ENCOUNTER — Encounter: Payer: Self-pay | Admitting: Internal Medicine

## 2013-07-25 ENCOUNTER — Other Ambulatory Visit: Payer: Self-pay | Admitting: Internal Medicine

## 2013-07-25 NOTE — Telephone Encounter (Signed)
I see the pt should have 1 refill left on Paxil, and see the [place] is just listed in history--please advise

## 2013-07-26 NOTE — Telephone Encounter (Signed)
Ok to fill as requested.

## 2013-07-30 ENCOUNTER — Telehealth: Payer: Self-pay | Admitting: Internal Medicine

## 2013-07-30 MED ORDER — OXYCODONE-ACETAMINOPHEN 10-325 MG PO TABS: 1.0000 | ORAL_TABLET | Freq: Four times a day (QID) | ORAL | Status: DC | PRN

## 2013-07-30 NOTE — Telephone Encounter (Signed)
Patient will be out of her Oxycodone rx next week and is asking for a new script.  Please call when ready to be picked up.

## 2013-07-30 NOTE — Telephone Encounter (Signed)
Done hardcopy to robin  

## 2013-07-30 NOTE — Telephone Encounter (Signed)
Called the patient informed to pickup hardcopy at the front desk. 

## 2013-07-30 NOTE — Telephone Encounter (Signed)
MD out of office this week. Pls advise on refill.../lmb 

## 2013-08-01 LAB — HM DIABETES EYE EXAM

## 2013-08-02 ENCOUNTER — Encounter: Payer: Self-pay | Admitting: Internal Medicine

## 2013-08-11 ENCOUNTER — Other Ambulatory Visit: Payer: Self-pay | Admitting: Internal Medicine

## 2013-08-27 ENCOUNTER — Ambulatory Visit (INDEPENDENT_AMBULATORY_CARE_PROVIDER_SITE_OTHER)
Admission: RE | Admit: 2013-08-27 | Discharge: 2013-08-27 | Disposition: A | Discharge status: Home / Self Care | Payer: Medicare Other | Source: Ambulatory Visit | Attending: Internal Medicine | Admitting: Internal Medicine

## 2013-08-27 ENCOUNTER — Encounter: Payer: Self-pay | Admitting: Internal Medicine

## 2013-08-27 ENCOUNTER — Other Ambulatory Visit (INDEPENDENT_AMBULATORY_CARE_PROVIDER_SITE_OTHER): Payer: Medicare Other

## 2013-08-27 ENCOUNTER — Ambulatory Visit (INDEPENDENT_AMBULATORY_CARE_PROVIDER_SITE_OTHER): Payer: Medicare Other | Admitting: Internal Medicine

## 2013-08-27 VITALS — BP 100/70 | HR 71 | Temp 97.5°F | Resp 15

## 2013-08-27 DIAGNOSIS — D649 Anemia, unspecified: Secondary | ICD-10-CM

## 2013-08-27 DIAGNOSIS — S6990XA Unspecified injury of unspecified wrist, hand and finger(s), initial encounter: Secondary | ICD-10-CM

## 2013-08-27 DIAGNOSIS — S6992XA Unspecified injury of left wrist, hand and finger(s), initial encounter: Secondary | ICD-10-CM

## 2013-08-27 DIAGNOSIS — R634 Abnormal weight loss: Secondary | ICD-10-CM

## 2013-08-27 LAB — CBC WITH DIFFERENTIAL/PLATELET
BASOS ABS: 0 10*3/uL (ref 0.0–0.1)
Basophils Relative: 0.4 % (ref 0.0–3.0)
EOS ABS: 0.2 10*3/uL (ref 0.0–0.7)
Eosinophils Relative: 3.4 % (ref 0.0–5.0)
HCT: 40.9 % (ref 36.0–46.0)
Hemoglobin: 13.9 g/dL (ref 12.0–15.0)
LYMPHS PCT: 29.5 % (ref 12.0–46.0)
Lymphs Abs: 2.1 10*3/uL (ref 0.7–4.0)
MCHC: 34 g/dL (ref 30.0–36.0)
MCV: 104.4 fl — AB (ref 78.0–100.0)
MONO ABS: 0.7 10*3/uL (ref 0.1–1.0)
Monocytes Relative: 9.6 % (ref 3.0–12.0)
NEUTROS ABS: 4 10*3/uL (ref 1.4–7.7)
NEUTROS PCT: 57.1 % (ref 43.0–77.0)
Platelets: 178 10*3/uL (ref 150.0–400.0)
RBC: 3.92 Mil/uL (ref 3.87–5.11)
RDW: 13.3 % (ref 11.5–14.6)
WBC: 7 10*3/uL (ref 4.5–10.5)

## 2013-08-27 LAB — IBC PANEL
Iron: 117 ug/dL (ref 42–145)
SATURATION RATIOS: 33.3 % (ref 20.0–50.0)
TRANSFERRIN: 250.8 mg/dL (ref 212.0–360.0)

## 2013-08-27 LAB — TSH: TSH: 0.6 u[IU]/mL (ref 0.35–5.50)

## 2013-08-27 NOTE — Progress Notes (Signed)
   Subjective:    Patient ID: [name], female    DOB: 10/04/1932, 80 y.o.   MRN: [id number]  HPI   She fell 08/22/13 injuring her left hand over the fourth MCP area. By the next morning there was significant swelling and discoloration. Subsequently she developed eschar over this area. She had some discoloration of the third fourth and fifth fingers of left hand; but this has improved.  She is on 81 mg of aspirin; she is on no other blood thinner.  Frequent falling is a significant issue to her.  She describes her marriage as strained at best. She denies any sexual or physical abuse. She is on Namenda; she states this was her husband's idea. They have been married 33 years since she states it's too late to leave.  She smokes one pack per day and has no interest in stopping. She does realize that this would improve her appetite were she to stop smoking.   Review of  the chart reveals anemia  Review of Systems  Her husband was not in the room but provided a written list of concerns. Her weight has dropped 99 pounds in she "eats like a bird".  She has had increased dizziness in the context of recurrent falls and describes general malaise.  He also describes increased only once.       Objective:   Physical Exam  She appears chronically ill. She is sitting in a wheelchair with her neck  flexed. There is increased lordosis of the upper spine  She has no scleral icterus or conjunctivitis  Chest is clear to auscultation.  Heart rhythm is regular with no significant murmurs.  Abdomen soft with normal bowel sounds  Extremities reveal weakness to opposition  She has resolving ecchymosis of the third fourth and fifth fingers of left hand. There is a 27 x 26 mm hematoma with a 4 x 6 mm eschar.  She has pain when making a fist over the area of the hematoma. This is also tender to palpation.  PIP DJD changes are present.  She has no epitrochlear or axillary  lymphadenopathy.  She is slightly lethargic but oriented x3. She appears very intelligent and interactive. She is obviously blunt, opened, and honest.  She exhibits lack of interest when the cardiovascular risk and neurovascular risks of smoking were discussed       Assessment & Plan:  #1 hand injury; rule out fracture  #2 weight loss  #3 tobacco addiction  #4 marital discord  #5 anemia  Plan :see orders

## 2013-08-27 NOTE — Patient Instructions (Signed)
Your next office appointment will be determined based upon review of your pending labs & x-rays. Those instructions will be transmitted to you through My Chart  OR  by mail;whichever process is your choice to receive results & recommendations . Use warm moist compresses to 3 times a day to the affected area.Please report warning signs as we discussed. Worrisome would be change in color or size, increased pain, fever, red streaks up arm or pus production.

## 2013-08-27 NOTE — Progress Notes (Signed)
Pre visit review using our clinic review tool, if applicable. No additional management support is needed unless otherwise documented below in the visit note. 

## 2013-09-04 ENCOUNTER — Encounter: Payer: Self-pay | Admitting: Internal Medicine

## 2013-09-04 ENCOUNTER — Telehealth: Payer: Self-pay | Admitting: Internal Medicine

## 2013-09-04 ENCOUNTER — Ambulatory Visit (INDEPENDENT_AMBULATORY_CARE_PROVIDER_SITE_OTHER): Payer: Medicare Other | Admitting: Internal Medicine

## 2013-09-04 VITALS — BP 118/70 | HR 76 | Temp 98.7°F

## 2013-09-04 DIAGNOSIS — I4891 Unspecified atrial fibrillation: Secondary | ICD-10-CM

## 2013-09-04 DIAGNOSIS — S60229A Contusion of unspecified hand, initial encounter: Secondary | ICD-10-CM

## 2013-09-04 DIAGNOSIS — I10 Essential (primary) hypertension: Secondary | ICD-10-CM

## 2013-09-04 MED ORDER — OXYCODONE-ACETAMINOPHEN 10-325 MG PO TABS: 1.0000 | ORAL_TABLET | Freq: Four times a day (QID) | ORAL | Status: DC | PRN

## 2013-09-04 NOTE — Progress Notes (Signed)
Pre visit review using our clinic review tool, if applicable. No additional management support is needed unless otherwise documented below in the visit note. 

## 2013-09-04 NOTE — Telephone Encounter (Signed)
Relevant patient education assigned to patient using Emmi. ° °

## 2013-09-04 NOTE — Progress Notes (Signed)
Subjective:    Patient ID: [name], female    DOB: Jun 19, 1932, 80 y.o.   MRN: [id number]  HPI  Patient is here for follow up  Reviewed chronic medical issues and interval medical events  Past Medical History  Diagnosis Date  . HTN (hypertension)   . Hyperlipidemia   . Diabetes mellitus, type 2   . Paroxysmal atrial fibrillation     chronic anticoag until 01/2013 - stopped due to high fall risk  . Right bundle branch block and incomplete left bundle branch block     with left axis deviation  . Chronic back pain     narcotic dependence  . Lumbar disc disease     dr. Trenton Gammon  . Mitral valve prolapse   . Macular degeneration   . Gastritis     RUT negative, 2005  . Diverticulosis   . Alcohol abuse   . Compression fracture 02/2011    s/p KP  . Arthritis   . Depression   . Myocardial infarction   . COPD (chronic obstructive pulmonary disease)     Review of Systems  Constitutional: Positive for fatigue (chronic).  Neurological: Positive for weakness. Negative for tremors and facial asymmetry.       Objective:   Physical Exam  BP 118/70  Pulse 76  Temp(Src) 98.7 F (37.1 C) (Oral)  SpO2 92% Wt Readings from Last 3 Encounters:  05/24/13 105 lb (47.628 kg)  02/08/13 104 lb 12.8 oz (47.537 kg)  02/01/13 102 lb 12.8 oz (46.63 kg)   Gen: NAD - tremulous and kyphotic without change -  Lungs: CTA B CV: irreg irreg, no edema Abdomen: firm, NTND, +BS  Neuro: AAOx2, no CN deficits - MAE and gait/balance not examined today Skin: L hand with 2cm hematoma over extensor surface of hand 4-5th MCP region, soft and improved - no abn warmth or erythema -  Psychiatric - rambling and tangential - oriented to person, place but poor short term recall (0/3 at 5 min)  Lab Results  Component Value Date   WBC 7.0 08/27/2013   HGB 13.9 08/27/2013   HCT 40.9 08/27/2013   PLT 178.0 08/27/2013   GLUCOSE 81 05/24/2013   CHOL 111 01/09/2013   TRIG 93.0 01/09/2013   HDL 45.50 01/09/2013   LDLCALC 47 01/09/2013   ALT 13 01/22/2013   AST 29 01/22/2013   NA 135 05/24/2013   K 4.6 05/24/2013   CL 99 05/24/2013   CREATININE 0.6 05/24/2013   BUN 13 05/24/2013   CO2 30 05/24/2013   TSH 0.60 08/27/2013   INR 2.23* 01/22/2013   HGBA1C 6.2 05/24/2013   MICROALBUR 0.1 SUPPR 01/09/2013    Dg Hand Complete Left  08/27/2013   CLINICAL DATA:  Left hand injury.  Fall 5 days ago.  EXAM: LEFT HAND - COMPLETE 3+ VIEW  COMPARISON:  None.  FINDINGS: There is motion on the lateral film. There is generalized decreased bone mineralization. There are mild degenerative changes of the interphalangeal joints. There is mild degenerate change at the first MCP joint. There is severe degenerative change involving the radiocarpal joint with widening of the scapholunate joint space and mild deformity of the adjacent scaphoid and lunate bones suggesting scapholunate advanced collapse. Mild ulnar plus variance is present. There are degenerative changes over the carpus with severe degenerative change of the first carpal metacarpal joint. There are no significant bony erosions. There is no acute fracture or dislocation.  IMPRESSION: No acute fracture or dislocation.  Moderate  osteoarthritic changes as described with severe degenerative changes and deformity at the radiocarpal joint suggesting scapholunate advanced collapse.   Electronically Signed   By: Marin Olp M.D.   On: 08/27/2013 16:04       Assessment & Plan:   Traumatic hematoma left hand, history reviewed. Resolving. No evidence for infection. Continue conservative care. Reassurance provided  Problem List Items Addressed This Visit   Atrial fibrillation     PAF, on CCB  Stopped coumadin fall 2014 after fall as medically felt risk>benefit Continue ASA 81 qd    Hypertension      BP Readings from Last 3 Encounters:  09/04/13 118/70  08/27/13 100/70  05/24/13 110/52   The current medical regimen is effective;  continue present plan and medications.      Other  Visit Diagnoses   Traumatic hematoma of hand    -  Primary

## 2013-09-04 NOTE — Patient Instructions (Signed)
It was good to see you today.  We have reviewed your prior records including labs and tests today  Medications reviewed and updated, no changes recommended at this time.  Warm compress to hand twice daily as needed until the lump is resolved  Please keep scheduled followup as planned for labs and a sugar check, call sooner if problems

## 2013-09-04 NOTE — Assessment & Plan Note (Signed)
BP Readings from Last 3 Encounters:  09/04/13 118/70  08/27/13 100/70  05/24/13 110/52   The current medical regimen is effective;  continue present plan and medications.

## 2013-09-04 NOTE — Assessment & Plan Note (Signed)
PAF, on CCB  Stopped coumadin fall 2014 after fall as medically felt risk>benefit Continue ASA 81 qd

## 2013-09-09 ENCOUNTER — Other Ambulatory Visit: Payer: Self-pay | Admitting: Internal Medicine

## 2013-09-18 ENCOUNTER — Other Ambulatory Visit: Payer: Self-pay | Admitting: Internal Medicine

## 2013-09-25 ENCOUNTER — Other Ambulatory Visit: Payer: Self-pay | Admitting: Internal Medicine

## 2013-09-30 ENCOUNTER — Telehealth: Payer: Self-pay | Admitting: *Deleted

## 2013-09-30 NOTE — Telephone Encounter (Signed)
5 Fentanyl & 30 oxycodone

## 2013-09-30 NOTE — Telephone Encounter (Signed)
Pt called requesting Oxycodone and Fentanyl Patch refills.  Please advise in Dr Maryclare Labrador absence

## 2013-10-01 MED ORDER — FENTANYL 50 MCG/HR TD PT72: 50.0000 ug | MEDICATED_PATCH | TRANSDERMAL | Status: DC

## 2013-10-01 MED ORDER — OXYCODONE-ACETAMINOPHEN 10-325 MG PO TABS: 1.0000 | ORAL_TABLET | Freq: Four times a day (QID) | ORAL | Status: DC | PRN

## 2013-10-01 NOTE — Telephone Encounter (Signed)
Generated rx's, md signed, notified pt rx's ready for pick-up...[name]

## 2013-10-02 LAB — HM MAMMOGRAPHY

## 2013-10-03 ENCOUNTER — Encounter: Payer: Self-pay | Admitting: Internal Medicine

## 2013-10-04 ENCOUNTER — Telehealth: Payer: Self-pay | Admitting: Internal Medicine

## 2013-10-04 MED ORDER — OXYCODONE-ACETAMINOPHEN 10-325 MG PO TABS: 1.0000 | ORAL_TABLET | Freq: Four times a day (QID) | ORAL | Status: DC | PRN

## 2013-10-04 NOTE — Telephone Encounter (Signed)
Prescription with correct dispense quantity  (#120) printed and signed

## 2013-10-04 NOTE — Telephone Encounter (Signed)
Pt called again.  She is requesting this be done today.

## 2013-10-04 NOTE — Telephone Encounter (Signed)
Notified pt rx ready for pick-up.../lmb 

## 2013-10-04 NOTE — Telephone Encounter (Signed)
Patient is asking that her Oxycodone rx that was picked up for #30 be changed to the quantity she normally receives. States that her husband is going out of town and she is afraid she will run out before he returns because she take 4 a day. Please advise.

## 2013-10-09 ENCOUNTER — Other Ambulatory Visit: Payer: Self-pay | Admitting: Internal Medicine

## 2013-11-01 ENCOUNTER — Telehealth: Payer: Self-pay | Admitting: Internal Medicine

## 2013-11-01 ENCOUNTER — Other Ambulatory Visit: Payer: Self-pay | Admitting: *Deleted

## 2013-11-01 ENCOUNTER — Other Ambulatory Visit: Payer: Self-pay

## 2013-11-01 MED ORDER — OXYCODONE-ACETAMINOPHEN 10-325 MG PO TABS: 1.0000 | ORAL_TABLET | Freq: Four times a day (QID) | ORAL | Status: DC | PRN

## 2013-11-01 MED ORDER — FENTANYL 50 MCG/HR TD PT72: 50.0000 ug | MEDICATED_PATCH | TRANSDERMAL | Status: DC

## 2013-11-01 NOTE — Telephone Encounter (Signed)
Pt husband brought fentanyl prescription back md gave # 5, does not come in 5 pharmacy states 10 came to a box. md ok # 10 printed rx md sign...[name]

## 2013-11-01 NOTE — Telephone Encounter (Signed)
Patient called requesting refills on Oxycodone and Fentanyl

## 2013-11-01 NOTE — Telephone Encounter (Signed)
Noted, corrected - thanks

## 2013-11-01 NOTE — Telephone Encounter (Signed)
Mr. Eriksson came to pick up scripts for spouse today.  He left the script for fentanyl.  He states that the script is for 5 patches but needs to read 10 bc the pharmacy does not carry less than 10 per box.

## 2013-11-01 NOTE — Telephone Encounter (Signed)
Patient aware scripts can be picked up

## 2013-11-05 ENCOUNTER — Other Ambulatory Visit: Payer: Self-pay | Admitting: Internal Medicine

## 2013-11-07 ENCOUNTER — Telehealth: Payer: Self-pay | Admitting: *Deleted

## 2013-11-07 NOTE — Telephone Encounter (Signed)
Pt is requesting a letter from md stating due to who health pt is not able to fly. The only way she can get reimburse is needing note from her medical doctor. Inform pt md out of office this week. Will return back in the office on Monday. Will call once she response to msg...[name]

## 2013-11-08 NOTE — Telephone Encounter (Signed)
Ok to generate such letter. i will sign when i return on Monday thanks

## 2013-11-11 ENCOUNTER — Encounter: Payer: Self-pay | Admitting: *Deleted

## 2013-11-12 ENCOUNTER — Ambulatory Visit: Payer: Medicare Other | Admitting: Internal Medicine

## 2013-11-12 NOTE — Telephone Encounter (Signed)
Generated letter. Notified pt ready to pick up...[name]

## 2013-11-16 ENCOUNTER — Other Ambulatory Visit: Payer: Self-pay | Admitting: Internal Medicine

## 2013-11-18 ENCOUNTER — Other Ambulatory Visit: Payer: Self-pay

## 2013-11-18 MED ORDER — RAMIPRIL 5 MG PO CAPS: 5.0000 mg | ORAL_CAPSULE | Freq: Every day | ORAL | Status: DC

## 2013-11-27 ENCOUNTER — Ambulatory Visit: Payer: Medicare Other | Admitting: Internal Medicine

## 2013-11-27 ENCOUNTER — Ambulatory Visit: Payer: Medicare Other | Admitting: Physician Assistant

## 2013-11-27 DIAGNOSIS — Z0289 Encounter for other administrative examinations: Secondary | ICD-10-CM

## 2013-11-28 ENCOUNTER — Ambulatory Visit: Payer: Medicare Other | Admitting: Internal Medicine

## 2013-11-28 ENCOUNTER — Other Ambulatory Visit (INDEPENDENT_AMBULATORY_CARE_PROVIDER_SITE_OTHER): Payer: Medicare Other

## 2013-11-28 ENCOUNTER — Encounter: Payer: Self-pay | Admitting: Internal Medicine

## 2013-11-28 ENCOUNTER — Ambulatory Visit (INDEPENDENT_AMBULATORY_CARE_PROVIDER_SITE_OTHER): Payer: Medicare Other | Admitting: Internal Medicine

## 2013-11-28 VITALS — BP 120/70 | HR 73 | Temp 98.6°F | Ht 65.0 in | Wt 100.8 lb

## 2013-11-28 DIAGNOSIS — W19XXXA Unspecified fall, initial encounter: Secondary | ICD-10-CM

## 2013-11-28 DIAGNOSIS — I482 Chronic atrial fibrillation, unspecified: Secondary | ICD-10-CM

## 2013-11-28 DIAGNOSIS — M545 Low back pain, unspecified: Secondary | ICD-10-CM

## 2013-11-28 DIAGNOSIS — W19XXXD Unspecified fall, subsequent encounter: Secondary | ICD-10-CM

## 2013-11-28 DIAGNOSIS — I4891 Unspecified atrial fibrillation: Secondary | ICD-10-CM

## 2013-11-28 DIAGNOSIS — Z5189 Encounter for other specified aftercare: Secondary | ICD-10-CM

## 2013-11-28 DIAGNOSIS — E119 Type 2 diabetes mellitus without complications: Secondary | ICD-10-CM

## 2013-11-28 LAB — LIPID PANEL
CHOLESTEROL: 116 mg/dL (ref 0–200)
HDL: 55.9 mg/dL (ref >39.00)
LDL Cholesterol: 37 mg/dL (ref 0–99)
NONHDL: 60.1
Total CHOL/HDL Ratio: 2
Triglycerides: 116 mg/dL (ref 0.0–149.0)
VLDL: 23.2 mg/dL (ref 0.0–40.0)

## 2013-11-28 LAB — HEMOGLOBIN A1C: HEMOGLOBIN A1C: 5.9 % (ref 4.6–6.5)

## 2013-11-28 MED ORDER — OXYCODONE-ACETAMINOPHEN 10-325 MG PO TABS: 1.0000 | ORAL_TABLET | Freq: Four times a day (QID) | ORAL | Status: DC | PRN

## 2013-11-28 NOTE — Progress Notes (Signed)
Subjective:    Patient ID: [name], female    DOB: 06-Sep-1932, 80 y.o.   MRN: [id number]  HPI  Patient is here for follow up  Reviewed chronic medical issues and interval medical events  Past Medical History  Diagnosis Date  . HTN (hypertension)   . Hyperlipidemia   . Diabetes mellitus, type 2   . Paroxysmal atrial fibrillation     chronic anticoag until 01/2013 - stopped due to high fall risk  . Right bundle branch block and incomplete left bundle branch block     with left axis deviation  . Chronic back pain     narcotic dependence  . Lumbar disc disease     dr. Trenton Gammon  . Mitral valve prolapse   . Macular degeneration   . Gastritis     RUT negative, 2005  . Diverticulosis   . Alcohol abuse   . Compression fracture 02/2011    s/p KP  . Arthritis   . Depression   . Myocardial infarction   . COPD (chronic obstructive pulmonary disease)     Review of Systems  Constitutional: Positive for fatigue (chronic). Negative for unexpected weight change.  Respiratory: Negative for cough and shortness of breath.   Cardiovascular: Negative for chest pain and leg swelling.  Musculoskeletal: Positive for back pain (chronic without change).  Neurological: Positive for dizziness, weakness (generalized), light-headedness and headaches.  Psychiatric/Behavioral: Positive for sleep disturbance. The patient is nervous/anxious.        Objective:   Physical Exam  BP 120/70  Pulse 73  Temp(Src) 98.6 F (37 C) (Oral)  Ht 5\' 5"  (1.651 m)  Wt 100 lb 12.8 oz (45.723 kg)  BMI 16.77 kg/m2  SpO2 86% Wt Readings from Last 3 Encounters:  11/28/13 100 lb 12.8 oz (45.723 kg)  05/24/13 105 lb (47.628 kg)  02/08/13 104 lb 12.8 oz (47.537 kg)   Gen: NAD - tremulous and kyphotic without change -  Lungs: CTA B CV: irreg irreg, no edema Abdomen: firm, NTND, +BS  Neuro: AAOx2, no CN deficits - MAE well, gait/balance observed - uses cane - cautious slow, short steps and bend forward (spinal  stenosis) Psychiatric - rambling and tangential - oriented to person, place but poor short term recall (0/3 at 5 min)  Lab Results  Component Value Date   WBC 7.0 08/27/2013   HGB 13.9 08/27/2013   HCT 40.9 08/27/2013   PLT 178.0 08/27/2013   GLUCOSE 81 05/24/2013   CHOL 111 01/09/2013   TRIG 93.0 01/09/2013   HDL 45.50 01/09/2013   LDLCALC 47 01/09/2013   ALT 13 01/22/2013   AST 29 01/22/2013   NA 135 05/24/2013   K 4.6 05/24/2013   CL 99 05/24/2013   CREATININE 0.6 05/24/2013   BUN 13 05/24/2013   CO2 30 05/24/2013   TSH 0.60 08/27/2013   INR 2.23* 01/22/2013   HGBA1C 6.2 05/24/2013   MICROALBUR 0.1 SUPPR 01/09/2013    Dg Hand Complete Left  08/27/2013   CLINICAL DATA:  Left hand injury.  Fall 5 days ago.  EXAM: LEFT HAND - COMPLETE 3+ VIEW  COMPARISON:  None.  FINDINGS: There is motion on the lateral film. There is generalized decreased bone mineralization. There are mild degenerative changes of the interphalangeal joints. There is mild degenerate change at the first MCP joint. There is severe degenerative change involving the radiocarpal joint with widening of the scapholunate joint space and mild deformity of the adjacent scaphoid and lunate bones suggesting  scapholunate advanced collapse. Mild ulnar plus variance is present. There are degenerative changes over the carpus with severe degenerative change of the first carpal metacarpal joint. There are no significant bony erosions. There is no acute fracture or dislocation.  IMPRESSION: No acute fracture or dislocation.  Moderate osteoarthritic changes as described with severe degenerative changes and deformity at the radiocarpal joint suggesting scapholunate advanced collapse.   Electronically Signed   By: Marin Olp M.D.   On: 08/27/2013 16:04       Assessment & Plan:   Problem List Items Addressed This Visit   Atrial fibrillation     PAF, on CCB for rate control Stopped coumadin fall 2014 after fall with injury because medical risk>benefit Continue  ASA 81 qd     Relevant Orders      Ambulatory referral to [place]     Increasing weakness (generalized) with balance and falls Suspect symptomatic spinal stenosis Will refer for HHPT    Relevant Orders      Ambulatory referral to [place], CHRONIC     Chronic problems - high narcotics maintained for same hx vert compression fx and VP 08/2010 -  So long as no escalating dose or change in pain, i will continue to fill medications as ongoing Again, requests higher dose of narcotics, which I again declined, but offered refer to pain mgmt. She declines referal to pain clinic: " i have tried those clinics before and they don't work"    Relevant Medications      oxyCODONE-acetaminophen (PERCOCET) 10-325 MG per tablet   Other Relevant Orders      Ambulatory referral to [place]   Type II or unspecified type diabetes mellitus without mention of complication, not stated as uncontrolled - Primary      On metformin solo, dose increase early 05/2012 previously stopped amaryl for dizziness and hypoglycemia symptoms in 02/2012 reviewed home cbgs: 90-120 fasting since stopping OHA 02/2012 Check a1c q 36mo  Lab Results  Component Value Date   HGBA1C 6.2 05/24/2013      Relevant Orders      Ambulatory referral to [facility]      Hemoglobin A1c      Lipid panel

## 2013-11-28 NOTE — Assessment & Plan Note (Signed)
PAF, on CCB for rate control Stopped coumadin fall 2014 after fall with injury because medical risk>benefit Continue ASA 81 qd

## 2013-11-28 NOTE — Assessment & Plan Note (Signed)
On metformin solo, dose increase early 05/2012 previously stopped amaryl for dizziness and hypoglycemia symptoms in 02/2012 reviewed home cbgs: 90-120 fasting since stopping OHA 02/2012 Check a1c q 33mo  Lab Results  Component Value Date   HGBA1C 6.2 05/24/2013

## 2013-11-28 NOTE — Progress Notes (Signed)
Pre visit review using our clinic review tool, if applicable. No additional management support is needed unless otherwise documented below in the visit note. 

## 2013-11-28 NOTE — Assessment & Plan Note (Signed)
Increasing weakness (generalized) with balance and falls Suspect symptomatic spinal stenosis Will refer for HHPT

## 2013-11-28 NOTE — Assessment & Plan Note (Signed)
Chronic problems - high narcotics maintained for same hx vert compression fx and VP 08/2010 -  So long as no escalating dose or change in pain, i will continue to fill medications as ongoing Again, requests higher dose of narcotics, which I again declined, but offered refer to pain mgmt. She declines referal to pain clinic: " i have tried those clinics before and they don't work"

## 2013-11-28 NOTE — Patient Instructions (Signed)
It was good to see you today.  We have reviewed your prior records including labs and tests today  Test(s) ordered today. Your results will be released to [facility] (or called to you) after review, usually within 72hours after test completion. If any changes need to be made, you will be notified at that same time.  Medications reviewed and updated, no changes recommended at this time, but MAY reduce your metformin dose if a1c<6 to help dizziness and other symptoms. Refill on medication(s) as discussed today.  we'll make referral for home health therapy for balance training because of falls . Our office will contact you regarding appointment(s) once made.  Please schedule followup in 6 months for diabetes mellitus check, call sooner if problems.

## 2013-11-29 MED ORDER — METFORMIN HCL 500 MG PO TABS: 500.0000 mg | ORAL_TABLET | Freq: Every day | ORAL | Status: DC

## 2013-11-29 NOTE — Addendum Note (Signed)
Addended by: Gwendolyn Grant A on: 11/29/2013 09:51 AM   Modules accepted: Orders

## 2013-12-03 ENCOUNTER — Other Ambulatory Visit: Payer: Self-pay | Admitting: Internal Medicine

## 2013-12-04 ENCOUNTER — Other Ambulatory Visit: Payer: Self-pay

## 2013-12-04 MED ORDER — TOLTERODINE TARTRATE ER 4 MG PO CP24: ORAL_CAPSULE | ORAL | Status: DC

## 2013-12-04 NOTE — Telephone Encounter (Signed)
Cycle fill medication... Authorization is required for next refill

## 2013-12-05 ENCOUNTER — Other Ambulatory Visit: Payer: Self-pay

## 2013-12-05 MED ORDER — TOLTERODINE TARTRATE ER 4 MG PO CP24: ORAL_CAPSULE | ORAL | Status: DC

## 2013-12-05 NOTE — Telephone Encounter (Signed)
Faxed script back to [facility].../lmb 

## 2013-12-07 ENCOUNTER — Other Ambulatory Visit: Payer: Self-pay | Admitting: Internal Medicine

## 2013-12-09 ENCOUNTER — Telehealth: Payer: Self-pay | Admitting: *Deleted

## 2013-12-09 NOTE — Telephone Encounter (Signed)
Left msg on triage stating wanting to let md know went to see pt on Sat she refuse service told her to call back Monday. Called pt today she is wanting service delayed until Wednesday...[name]

## 2013-12-10 ENCOUNTER — Other Ambulatory Visit: Payer: Self-pay | Admitting: Internal Medicine

## 2013-12-10 NOTE — Telephone Encounter (Signed)
Noted. No changes recommended.

## 2013-12-12 ENCOUNTER — Telehealth: Payer: Self-pay | Admitting: Internal Medicine

## 2013-12-12 NOTE — Telephone Encounter (Signed)
[name] with [facility] called to let Dr. Asa Lente know that the patient had her PT evaluation yesterday and she will begin home health PT 1x a week for for 6 weeks to work on balance, gait, transfers and strengthening. Patient prefers only one session a week. No call back requested.

## 2013-12-12 NOTE — Telephone Encounter (Signed)
Noted thanks °

## 2013-12-18 ENCOUNTER — Ambulatory Visit (INDEPENDENT_AMBULATORY_CARE_PROVIDER_SITE_OTHER): Payer: Medicare Other | Admitting: Physician Assistant

## 2013-12-18 ENCOUNTER — Encounter: Payer: Self-pay | Admitting: Physician Assistant

## 2013-12-18 VITALS — BP 112/64 | HR 64 | Ht 66.0 in | Wt 103.0 lb

## 2013-12-18 DIAGNOSIS — I10 Essential (primary) hypertension: Secondary | ICD-10-CM

## 2013-12-18 DIAGNOSIS — I480 Paroxysmal atrial fibrillation: Secondary | ICD-10-CM

## 2013-12-18 DIAGNOSIS — I4891 Unspecified atrial fibrillation: Secondary | ICD-10-CM

## 2013-12-18 DIAGNOSIS — R42 Dizziness and giddiness: Secondary | ICD-10-CM

## 2013-12-18 NOTE — Progress Notes (Signed)
HPI: This is a 80 year old female patient of Dr. Jolyn Nap who has history of paroxysmal atrial fibrillation, hypertension, diabetes mellitus, hyperlipidemia, right bundle branch block, and mitral valve disease. 2-D echo in 2011 EF 55-60% was severely calcified mitral valve with restricted motion of the posterior leaflet mean gradient 2, peak gradient 7, mild MR. She had been maintained on Coumadin until last Sept. when she had a fall in the setting of his super therapeutic INR. It was decided to stop anticoagulation due to high fall risk.  Patient comes in for yearly followup. She still has chronic dizziness associated with headaches. She can be dizzy when laying in bed. She also continues to fall at home. She is working with physical therapy to try to   gain strength and work on her balance. She denies orthostatic symptoms and said her her pressure never changes when she changes position. She also has erratic blood sugars in the morning. She is trying to keep track of them. She denies any chest pain, dyspnea, dyspnea on exertion, or syncope. She is in normal sinus rhythm today. She says she can tell when she goes into atrial fibrillation but it's not often. She has an appointment scheduled with Dr. Caryl Comes in September but she'd like to forego this. Allergies -- Codeine -- Other (See Comments)   --  unknown  -- Morphine And Related -- Other (See Comments)   --  Pt had severe respiratory depression and excessive            sedation during a recent hospitalization and was            told it was 2/2 morphine.  Current Outpatient Prescriptions on File Prior to Visit: aspirin EC 81 MG tablet, Take 1 tablet (81 mg total) by mouth daily., Disp: 150 tablet, Rfl: 2 b complex vitamins tablet, Take 1 tablet by mouth daily., Disp: , Rfl:  Cholecalciferol (VITAMIN D) 1000 UNITS capsule, Take 1,000 Units by mouth daily.  , Disp: , Rfl:  diazepam (VALIUM) 5 MG tablet, Take 1 tablet (5 mg total) by mouth every 6  (six) hours as needed for anxiety., Disp: 120 tablet, Rfl: 5 fentaNYL (DURAGESIC - DOSED MCG/HR) 50 MCG/HR, Place 1 patch (50 mcg total) onto the skin every 3 (three) days., Disp: 10 patch, Rfl: 0 metFORMIN (GLUCOPHAGE) 500 MG tablet, Take 1 tablet (500 mg total) by mouth daily with breakfast., Disp: , Rfl:  metoprolol tartrate (LOPRESSOR) 25 MG tablet, TAKE 1/2 TAB BY MOUTH TWICE DAILY, Disp: 90 tablet, Rfl: 0 Multiple Vitamin (MULTIVITAMIN WITH MINERALS) TABS tablet, Take 1 tablet by mouth daily., Disp: , Rfl:  NAMENDA XR 14 MG CP24, TAKE 1 CAPSULE BY MOUTH DAILY., Disp: 30 capsule, Rfl: 11 oxyCODONE-acetaminophen (PERCOCET) 10-325 MG per tablet, Take 1 tablet by mouth every 6 (six) hours as needed for pain. Do not fill more than once every 30 days., Disp: 120 tablet, Rfl: 0 PARoxetine (PAXIL) 40 MG tablet, TAKE 1 TABLET (40 MG TOTAL) BY MOUTH EVERY MORNING., Disp: 30 tablet, Rfl: 5 polyethylene glycol powder (GLYCOLAX/MIRALAX) powder, Take 17 g by mouth daily., Disp: 3350 g, Rfl: 1 pravastatin (PRAVACHOL) 80 MG tablet, TAKE ONE TABLET BY MOUTH ONCE A DAY, Disp: 30 tablet, Rfl: 0 ramipril (ALTACE) 5 MG capsule, Take 1 capsule (5 mg total) by mouth daily., Disp: 30 capsule, Rfl: 11 temazepam (RESTORIL) 30 MG capsule, TAKE 1 CAPSULE(S) BY MOUTH AT BEDTIME AS NEEDED, Disp: 30 capsule, Rfl: 5 tolterodine (DETROL LA) 4 MG 24 hr capsule,  TAKE ONE CAPSULE BY MOUTH DAILY, Disp: 30 capsule, Rfl: 12 verapamil (CALAN-SR) 180 MG CR tablet, TAKE ONE TABLET BY MOUTH TWICE DAILY, Disp: 60 tablet, Rfl: 5  No current facility-administered medications on file prior to visit.   Past Medical History:   HTN (hypertension)                                           Hyperlipidemia                                               Diabetes mellitus, type 2                                    Paroxysmal atrial fibrillation                                 Comment:chronic anticoag until 01/2013 - stopped due to                high fall risk   Right bundle branch block and incomplete left *                Comment:with left axis deviation   Chronic back pain                                              Comment:narcotic dependence   Lumbar disc disease                                            Comment:dr. poole   Mitral valve prolapse                                        Macular degeneration                                         Gastritis                                                      Comment:RUT negative, 2005   Diverticulosis                                               Alcohol abuse  Compression fracture                            02/2011        Comment:s/p KP   Arthritis                                                    Depression                                                   Myocardial infarction                                        COPD (chronic obstructive pulmonary disease)                Past Surgical History:   right ankle surgery                                           KNEE ARTHROSCOPY                                             Review of patient's family history indicates:   Heart disease                  Mother                   Cancer                         Father                     Comment: throat   Atrial fibrillation            Sister                   Social History   Marital Status: Married             Spouse Name:                      Years of Education:                 Number of children:             Occupational History   None on file  Social History Main Topics   Smoking Status: Current Every Day Smoker        Packs/Day: 0.50  Years: 56        Types: Cigarettes   Smokeless Status: Never Used                       Alcohol Use: Yes               Comment: Occasional   Drug Use: No  Sexual Activity: No                 Other Topics            Concern   None on file  Social History Narrative    None on file    ROS: chronic back pain, otherwise see history of present illness   PHYSICAL EXAM:  Thin, elderly , in no acute distress. Neck: No JVD, HJR, Bruit, or thyroid enlargement  Lungs: No tachypnea, clear without wheezing, rales, or rhonchi  Cardiovascular: RRR, PMI not displaced,  2/6 systolic murmur at the left sternal border and apex, no gallops, bruit, thrill, or heave.  Abdomen: BS normal. Soft without organomegaly, masses, lesions or tenderness.  Extremities: without cyanosis, clubbing or edema. Good distal pulses bilateral  SKin: Warm, no lesions or rashes   Musculoskeletal: No deformities  Neuro: no focal signs  BP 112/64  Pulse 64  Ht 5\' 6"  (1.676 m)  Wt 103 lb (46.72 kg)  BMI 16.63 kg/m2    EKG: normal sinus rhythm, old septal infarct

## 2013-12-18 NOTE — Assessment & Plan Note (Signed)
Patient continues to have recurrent falls. PT is working with her to gain strength and balance.

## 2013-12-18 NOTE — Patient Instructions (Signed)
Your physician recommends that you continue on your current medications as directed. Please refer to the Current Medication list given to you today.  Your physician wants you to follow-up in: 6 months with Dr. Klein. You will receive a reminder letter in the mail two months in advance. If you don't receive a letter, please call our office to schedule the follow-up appointment.  

## 2013-12-18 NOTE — Assessment & Plan Note (Signed)
Patient is in normal sinus rhythm today, rate controlled on metoprolol. No anticoagulation because of recurrent falls. Followup with Dr. Caryl Comes in 6 months.

## 2013-12-18 NOTE — Assessment & Plan Note (Signed)
Blood pressure stable ? ?

## 2013-12-24 ENCOUNTER — Encounter: Payer: Self-pay | Admitting: Family Medicine

## 2013-12-24 ENCOUNTER — Ambulatory Visit (INDEPENDENT_AMBULATORY_CARE_PROVIDER_SITE_OTHER): Payer: Medicare Other | Admitting: Family Medicine

## 2013-12-24 VITALS — BP 106/66 | HR 71 | Ht 66.0 in | Wt 103.0 lb

## 2013-12-24 DIAGNOSIS — R634 Abnormal weight loss: Secondary | ICD-10-CM

## 2013-12-24 DIAGNOSIS — M1710 Unilateral primary osteoarthritis, unspecified knee: Secondary | ICD-10-CM

## 2013-12-24 NOTE — Assessment & Plan Note (Signed)
The patient does have severe arthritic changes in multiple joints especially the knees. We discussed different treatment options including bracing, icing, and over-the-counter medications. Patient though would just like some pain relief overall. Patient is already on a fentanyl patch at this time. We discussed the possibilities of possible side effects of injections patient was well aware she states. Patient wanted this done and did have it done. We discussed watching her blood sugars a regular basis now. We discussed how the importance of icing could be beneficial in encourage her and her to help increase her protein supplementation. Patient is going to try these interventions and come back in 4-6 weeks. I did make it clear to patient as well as her husband that I could not change any of her arthritis isn't anything I am doing at this time is just for symptomatic relief. They were understanding. We discussed that I do not want to do injections greater than every 3 months. Unfortunately his pain is not resolved there is no much else I can do

## 2013-12-24 NOTE — Patient Instructions (Signed)
Very nice to meet you You have bad arthritis.  Take tylenol 325 mg three times a day is the best evidence based medicine we have for arthritis.  Vitamin D 2000 IU daily Fish oil 2 grams daily.  Capsaicin topically up to four times a day may also help with pain. Cortisone injections are an option if these interventions do not seem to make a difference or need more relief. We did an injection.  If cortisone injections do not help, there are different types of shots that may help but they take longer to take effect.  We can discuss this at follow up.  It's important that you continue to stay active. Try Ensure or boost 30-45 minutes before lunch dinner and bed to increase your appetite.  Protein, Protein Protein.  Come back and see me in 4-6 weeks

## 2013-12-24 NOTE — Assessment & Plan Note (Signed)
Patient does appear to be frail and cachectic somewhat. I believe patient's dementia has decreased her appetite. We discussed proper protein supplementation that could be beneficial. We also discussed the possibility of other medication for appetite stimulation which he wanted to discuss with her primary care provider.

## 2013-12-24 NOTE — Progress Notes (Signed)
  [name] [place], [place] [address] Phone: [phone] Subjective:     CC: Multiple joint pains  [id number] [name] is a 80 y.o. female coming in with complaint of multiple joint pains especially in her knee she states. Patient states that her knees have been hurting her somewhat she has not been walking anymore. Patient states that she has been in a wheelchair for a significant amount of time.     Past medical history, social, surgical and family history all reviewed in electronic medical record.   Review of Systems: No headache, visual changes, nausea, vomiting, diarrhea, constipation, dizziness, abdominal pain, skin rash, fevers, chills, night sweats, weight loss, swollen lymph nodes, body aches, joint swelling, muscle aches, chest pain, shortness of breath, mood changes.   Objective Blood pressure 106/66, pulse 71, height 5\' 6"  (1.676 m), weight 103 lb (46.72 kg).  General: No apparent distress alert and oriented x3 mood and affect normal, dressed appropriately. Frail, patient does have some mild attention span deficit patient is in a wheelchair HEENT: Pupils equal, extraocular movements intact  Respiratory: Patient's speak in full sentences and does not appear short of breath  Cardiovascular: No lower extremity edema, non tender, no erythema  Skin: Some dry skin but no bruising noted Abdomen: Soft nontender  Neuro: Cranial nerves II through XII are intact, neurovascularly intact in all extremities with 2+ DTRs and 2+ pulses.  Lymph: No lymphadenopathy of posterior or anterior cervical chain or axillae bilaterally.  Gait normal with good balance and coordination.  MSK:  Non tender with moderate range of motion and good stability and symmetric strength and tone of shoulders, elbows, wrist, hip, knee and ankles bilaterally. Severe osteoarthritic changes of multiple joints. Patient is tender in multiple joints with decreased range of  motion. Knee: Bilateral Patient has significant atrophy of all muscles. Severe tenderness to joint line on the medial aspect bilaterally. ROM full in flexion and extension and lower leg rotation. Ligaments with solid consistent endpoints including ACL, PCL, LCL, MCL. Patellar glide with moderate crepitus. Patellar and quadriceps tendons unremarkable. Hamstring and quadriceps strength significantly decreased but symmetric  After informed written and verbal consent, patient was seated on exam table. Right knee was prepped with alcohol swab and utilizing anterolateral approach, patient's right knee space was injected with 4:1  marcaine 0.5%: Kenalog 40mg /dL. Patient tolerated the procedure well without immediate complications.  After informed written and verbal consent, patient was seated on exam table. Left knee was prepped with alcohol swab and utilizing anterolateral approach, patient's left knee space was injected with 4:1  marcaine 0.5%: Kenalog 40mg /dL. Patient tolerated the procedure well without immediate complications.   Impression and Recommendations:     This case required medical decision making of moderate complexity.

## 2013-12-28 ENCOUNTER — Other Ambulatory Visit: Payer: Self-pay | Admitting: Internal Medicine

## 2014-01-01 ENCOUNTER — Ambulatory Visit: Payer: Medicare Other | Admitting: Internal Medicine

## 2014-01-07 ENCOUNTER — Other Ambulatory Visit: Payer: Self-pay | Admitting: Internal Medicine

## 2014-01-07 ENCOUNTER — Encounter: Payer: Self-pay | Admitting: Gastroenterology

## 2014-01-08 ENCOUNTER — Other Ambulatory Visit: Payer: Self-pay

## 2014-01-08 MED ORDER — METFORMIN HCL 500 MG PO TABS: 500.0000 mg | ORAL_TABLET | Freq: Every day | ORAL | Status: AC

## 2014-01-14 ENCOUNTER — Telehealth: Payer: Self-pay | Admitting: *Deleted

## 2014-01-14 MED ORDER — FENTANYL 50 MCG/HR TD PT72: 50.0000 ug | MEDICATED_PATCH | TRANSDERMAL | Status: DC

## 2014-01-14 MED ORDER — OXYCODONE-ACETAMINOPHEN 10-325 MG PO TABS: 1.0000 | ORAL_TABLET | Freq: Four times a day (QID) | ORAL | Status: DC | PRN

## 2014-01-14 NOTE — Telephone Encounter (Signed)
Left msg on triage requesting refill on her fentanyl patches & oxycodone. MD out of office. Pls advise...[name]

## 2014-01-14 NOTE — Telephone Encounter (Signed)
Done hardcopy to robin - both 

## 2014-01-14 NOTE — Telephone Encounter (Signed)
Dr. Jenny Reichmann pls advise on refill...[name]

## 2014-01-15 NOTE — Telephone Encounter (Signed)
Called the patient left detailed message that both oxycodone and patches Dr. Jenny Reichmann filled and needs to pickup hardcopy at the front desk.

## 2014-01-16 ENCOUNTER — Ambulatory Visit: Payer: Medicare Other | Admitting: Internal Medicine

## 2014-01-21 ENCOUNTER — Encounter: Payer: Self-pay | Admitting: Family Medicine

## 2014-01-21 ENCOUNTER — Ambulatory Visit (INDEPENDENT_AMBULATORY_CARE_PROVIDER_SITE_OTHER): Payer: Medicare Other | Admitting: Family Medicine

## 2014-01-21 VITALS — BP 114/60 | HR 69 | Ht 66.0 in | Wt 103.0 lb

## 2014-01-21 DIAGNOSIS — Z23 Encounter for immunization: Secondary | ICD-10-CM

## 2014-01-21 DIAGNOSIS — M1710 Unilateral primary osteoarthritis, unspecified knee: Secondary | ICD-10-CM

## 2014-01-21 NOTE — Assessment & Plan Note (Signed)
Patient over the years has failed all other conservative therapy. Patient has failed corticosteroid injections, before patient and he failed formal physical therapy, has now failed over-the-counter medications, prescription narcotics, as well as home exercises and icing. Patient was started on Synvisc supplementation today. Patient was given the first in a series of 3 injections in both knees. Patient and will come back and see me again in one week for the next injection. Patient will continue with a conservative therapy at this time and has declined repeating formal physical therapy.  Spent greater than 25 minutes with patient face-to-face and had greater than 50% of counseling including as described above in assessment and plan.

## 2014-01-21 NOTE — Patient Instructions (Signed)
Good to see you.  We started synvisc today and will see you next week.  Ice is your friend.  COnitnue the exercises.  See you gain next weeks.

## 2014-01-21 NOTE — Progress Notes (Signed)
  [name] [place], [place] [address] Phone: [phone] Subjective:     CC: Bilateral knee pain followup  [id number] [name] is a 80 y.o. female coming in with complaint of multiple joint pains especially in her knee she states. Patient states that her knees have been hurting her somewhat she has not been walking anymore. Patient states that she has been in a wheelchair for a significant amount of time. Patient states that the pain was decreased for approximately 2 weeks and then started coming back again. Patient continues to being nearly nonambulatory due to weakness overall. Patient continues to live with her husband. Patient finds it difficult to increase any of her activities secondary to the pain that she is having mostly in her knees. Continues on the narcotics a regular basis. Patient is is from another provider. States that the knees are significantly sore mostly over the medial aspect bilaterally. Patient does have underlying severe osteoarthritic changes.     Past medical history, social, surgical and family history all reviewed in electronic medical record.   Review of Systems: No headache, visual changes, nausea, vomiting, diarrhea, constipation, dizziness, abdominal pain, skin rash, fevers, chills, night sweats, weight loss, swollen lymph nodes, body aches, joint swelling, muscle aches, chest pain, shortness of breath, mood changes.   Objective Blood pressure 114/60, pulse 69, height 5\' 6"  (1.676 m), weight 103 lb (46.72 kg), SpO2 94.00%.  General: No apparent distress alert and oriented x3 mood and affect normal, dressed appropriately. Frail, patient does have some mild attention span deficit patient is in a wheelchair HEENT: Pupils equal, extraocular movements intact  Respiratory: Patient's speak in full sentences and does not appear short of breath  Cardiovascular: No lower extremity edema, non tender, no erythema  Skin:  Some dry skin but no bruising noted Abdomen: Soft nontender  Neuro: Cranial nerves II through XII are intact, neurovascularly intact in all extremities with 2+ DTRs and 2+ pulses.  Lymph: No lymphadenopathy of posterior or anterior cervical chain or axillae bilaterally.  Gait normal with good balance and coordination.  MSK:  Non tender with moderate range of motion and good stability and symmetric strength and tone of shoulders, elbows, wrist, hip, knee and ankles bilaterally. Severe osteoarthritic changes of multiple joints. Patient is tender in multiple joints with decreased range of motion. Knee: Bilateral Patient has significant atrophy of all muscles. Severe tenderness to joint line on the medial aspect bilaterally. ROM full in flexion and extension and lower leg rotation. Ligaments with solid consistent endpoints including ACL, PCL, LCL, MCL. Patellar glide with moderate crepitus. Patellar and quadriceps tendons unremarkable. Hamstring and quadriceps strength significantly decreased but symmetric  After informed written and verbal consent, patient was seated on exam table. Right knee was prepped with alcohol swab and utilizing anterolateral approach, patient's right knee space was injected with16 mg/2.5 mL of Synvisc (sodium hyaluronate) in a prefilled syringe was injected easily into the knee through a 22-gauge needle..  After informed written and verbal consent, patient was seated on exam table. Left knee was prepped with alcohol swab and utilizing anterolateral approach, patient's left knee space was injected with 16 mg/2.5 mL of Synvisc (sodium hyaluronate) in a prefilled syringe was injected easily into the knee through a 22-gauge needle..   Impression and Recommendations:     This case required medical decision making of moderate complexity.

## 2014-01-30 ENCOUNTER — Ambulatory Visit (INDEPENDENT_AMBULATORY_CARE_PROVIDER_SITE_OTHER): Payer: Medicare Other | Admitting: Family Medicine

## 2014-01-30 ENCOUNTER — Other Ambulatory Visit: Payer: Self-pay | Admitting: Internal Medicine

## 2014-01-30 ENCOUNTER — Encounter: Payer: Self-pay | Admitting: Family Medicine

## 2014-01-30 VITALS — BP 110/62 | HR 60 | Ht 66.0 in | Wt 103.0 lb

## 2014-01-30 DIAGNOSIS — M1710 Unilateral primary osteoarthritis, unspecified knee: Secondary | ICD-10-CM

## 2014-01-30 NOTE — Progress Notes (Signed)
[name] [place], [place] [address] Phone: [phone] Subjective:     CC: Bilateral knee pain followup synvisc #2  [id number] [name] is a 80 y.o. female coming in with complaint of multiple joint pains especially in her knee she states. Patient states that her knees have been hurting her somewhat she has not been walking anymore. Patient states that she has been in a wheelchair for a significant amount of time. Patient states that the pain was decreased for approximately 2 weeks and then started coming back again. Patient continues to being nearly nonambulatory due to weakness overall. Patient continues to live with her husband. Patient finds it difficult to increase any of her activities secondary to the pain that she is having mostly in her knees. Continues on the narcotics a regular basis. Patient is is from another provider. States that the knees are significantly sore mostly over the medial aspect bilaterally. Patient does have underlying severe osteoarthritic changes. Started synvisc last week and has not noticed any significant improvement. Patient has not been doing the exercises and has only remember to ice the couple times since last visit. No new symptoms     Past medical history, social, surgical and family history all reviewed in electronic medical record.   Review of Systems: No headache, visual changes, nausea, vomiting, diarrhea, constipation, dizziness, abdominal pain, skin rash, fevers, chills, night sweats, weight loss, swollen lymph nodes, body aches, joint swelling, muscle aches, chest pain, shortness of breath, mood changes.   Objective Blood pressure 110/62, pulse 60, height 5\' 6"  (1.676 m), weight 103 lb (46.72 kg), SpO2 93.00%.  General: No apparent distress alert and oriented x3 mood and affect normal, dressed appropriately. Frail, patient does have some mild attention span deficit patient is in a  wheelchair HEENT: Pupils equal, extraocular movements intact  Respiratory: Patient's speak in full sentences and does not appear short of breath  Cardiovascular: No lower extremity edema, non tender, no erythema  Skin: Some dry skin but no bruising noted Abdomen: Soft nontender  Neuro: Cranial nerves II through XII are intact, neurovascularly intact in all extremities with 2+ DTRs and 2+ pulses.  Lymph: No lymphadenopathy of posterior or anterior cervical chain or axillae bilaterally.  Gait normal with good balance and coordination.  MSK:  Non tender with moderate range of motion and good stability and symmetric strength and tone of shoulders, elbows, wrist, hip, knee and ankles bilaterally. Severe osteoarthritic changes of multiple joints. Patient is tender in multiple joints with decreased range of motion. Knee: Bilateral Patient has significant atrophy of all muscles. Severe tenderness to joint line on the medial aspect bilaterally. ROM full in flexion and extension and lower leg rotation. Ligaments with solid consistent endpoints including ACL, PCL, LCL, MCL. Patellar glide with moderate crepitus. Patellar and quadriceps tendons unremarkable. Hamstring and quadriceps strength significantly decreased but symmetric  After informed written and verbal consent, patient was seated on exam table. Right knee was prepped with alcohol swab and utilizing anterolateral approach, patient's right knee space was injected with16 mg/2.5 mL of Synvisc (sodium hyaluronate) in a prefilled syringe was injected easily into the knee through a 22-gauge needle..  After informed written and verbal consent, patient was seated on exam table. Left knee was prepped with alcohol swab and utilizing anterolateral approach, patient's left knee space was injected with 16 mg/2.5 mL of Synvisc (sodium hyaluronate) in a prefilled syringe was injected easily into the knee through a 22-gauge needle.Marland Kitchen  Impression and  Recommendations:     This case required medical decision making of moderate complexity.

## 2014-01-30 NOTE — Assessment & Plan Note (Signed)
Patient had second set of injections in bilateral knees today of the Synvisc. Patient will come back next week for the third and final injection. Patient will continue on conservative therapy at this time.

## 2014-01-30 NOTE — Patient Instructions (Signed)
Good to see you [name] is your friend Continue the exercises if you can See you again next for 3rd and final injection.

## 2014-02-04 ENCOUNTER — Other Ambulatory Visit: Payer: Self-pay | Admitting: Internal Medicine

## 2014-02-05 ENCOUNTER — Encounter: Payer: Self-pay | Admitting: Family Medicine

## 2014-02-05 ENCOUNTER — Ambulatory Visit (INDEPENDENT_AMBULATORY_CARE_PROVIDER_SITE_OTHER): Payer: Medicare Other | Admitting: Family Medicine

## 2014-02-05 VITALS — BP 102/62 | HR 69

## 2014-02-05 DIAGNOSIS — Z23 Encounter for immunization: Secondary | ICD-10-CM

## 2014-02-05 DIAGNOSIS — M1710 Unilateral primary osteoarthritis, unspecified knee: Secondary | ICD-10-CM

## 2014-02-05 NOTE — Assessment & Plan Note (Signed)
She has made some mild improvement since last visit. The knots overly optimist because she'll make significant improvement due to her other comorbidities. Patient is having what appears to be potentially failure to thrive and is following up with primary care provider for the weight loss with some intentional. I do think there is a midline depression and other comorbidities as making patient's pain worse. Patient will continue with her chronic pain medications. Encourage her to try to do some range of motion exercises for the knees. Patient will follow up with me again in one month for further evaluation and treatment.

## 2014-02-05 NOTE — Patient Instructions (Addendum)
Good to see you.  Continue ice 10 minutes up to 2 times a day You will see me again in 1 month.

## 2014-02-05 NOTE — Progress Notes (Signed)
  [name] [address] [place], [place] [address] Phone: [phone] Subjective:     CC: Bilateral knee pain followup synvisc #3  [id number] [name] is a 80 y.o. female coming in with complaint of multiple joint pains especially in her knee she states. Patient states that her knees have been hurting her somewhat she has not been walking anymore. Patient states that she has been in a wheelchair for a significant amount of time. Patient states that the pain was decreased for approximately 2 weeks and then started coming back again. Patient failed all other conservative therapy. Patient was making some improvement but the second injection. Patient is here for her third and final injection. Denies any new symptoms.     Past medical history, social, surgical and family history all reviewed in electronic medical record.   Review of Systems: No headache, visual changes, nausea, vomiting, diarrhea, constipation, dizziness, abdominal pain, skin rash, fevers, chills, night sweats, weight loss, swollen lymph nodes, body aches, joint swelling, muscle aches, chest pain, shortness of breath, mood changes.   Objective Blood pressure 102/62, pulse 69, SpO2 93.00%.  General: No apparent distress alert and oriented x3 mood and affect normal, dressed appropriately. Frail, patient does have some mild attention span deficit patient is in a wheelchair HEENT: Pupils equal, extraocular movements intact  Respiratory: Patient's speak in full sentences and does not appear short of breath  Cardiovascular: No lower extremity edema, non tender, no erythema  Skin: Some dry skin but no bruising noted Abdomen: Soft nontender  Neuro: Cranial nerves II through XII are intact, neurovascularly intact in all extremities with 2+ DTRs and 2+ pulses.  Lymph: No lymphadenopathy of posterior or anterior cervical chain or axillae bilaterally.  Gait normal with good balance and coordination.    MSK:  Non tender with moderate range of motion and good stability and symmetric strength and tone of shoulders, elbows, wrist, hip, knee and ankles bilaterally. Severe osteoarthritic changes of multiple joints. Patient is tender in multiple joints with decreased range of motion. Knee: Bilateral Patient has significant atrophy of all muscles. Severe tenderness to joint line on the medial aspect bilaterally. ROM full in flexion and extension and lower leg rotation. Ligaments with solid consistent endpoints including ACL, PCL, LCL, MCL. Patellar glide with moderate crepitus. Patellar and quadriceps tendons unremarkable. Hamstring and quadriceps strength significantly decreased but symmetric  After informed written and verbal consent, patient was seated on exam table. Right knee was prepped with alcohol swab and utilizing anterolateral approach, patient's right knee space was injected with16 mg/2.5 mL of Synvisc (sodium hyaluronate) in a prefilled syringe was injected easily into the knee through a 22-gauge needle..  After informed written and verbal consent, patient was seated on exam table. Left knee was prepped with alcohol swab and utilizing anterolateral approach, patient's left knee space was injected with 16 mg/2.5 mL of Synvisc (sodium hyaluronate) in a prefilled syringe was injected easily into the knee through a 22-gauge needle..   Impression and Recommendations:     This case required medical decision making of moderate complexity.

## 2014-02-20 ENCOUNTER — Telehealth: Payer: Self-pay | Admitting: Internal Medicine

## 2014-02-20 NOTE — Telephone Encounter (Signed)
Patient need prescription of Oxycodone 10-325 mg

## 2014-02-21 ENCOUNTER — Other Ambulatory Visit: Payer: Self-pay

## 2014-02-21 MED ORDER — DIAZEPAM 5 MG PO TABS: 5.0000 mg | ORAL_TABLET | Freq: Four times a day (QID) | ORAL | Status: DC | PRN

## 2014-02-21 MED ORDER — OXYCODONE-ACETAMINOPHEN 10-325 MG PO TABS: 1.0000 | ORAL_TABLET | Freq: Four times a day (QID) | ORAL | Status: DC | PRN

## 2014-02-21 NOTE — Telephone Encounter (Signed)
LVM for pt to call back regarding rx request.  

## 2014-02-21 NOTE — Telephone Encounter (Signed)
Patient returned your phone call.  Is requesting call back.

## 2014-02-21 NOTE — Telephone Encounter (Signed)
Rx has been printed for md to sign.

## 2014-02-24 ENCOUNTER — Other Ambulatory Visit: Payer: Self-pay | Admitting: Internal Medicine

## 2014-02-25 ENCOUNTER — Other Ambulatory Visit: Payer: Self-pay | Admitting: Internal Medicine

## 2014-03-05 ENCOUNTER — Ambulatory Visit (INDEPENDENT_AMBULATORY_CARE_PROVIDER_SITE_OTHER): Payer: Medicare Other | Admitting: Family Medicine

## 2014-03-05 ENCOUNTER — Encounter: Payer: Self-pay | Admitting: Family Medicine

## 2014-03-05 VITALS — BP 90/62 | HR 71 | Ht 66.0 in | Wt 103.0 lb

## 2014-03-05 DIAGNOSIS — M1710 Unilateral primary osteoarthritis, unspecified knee: Secondary | ICD-10-CM

## 2014-03-05 NOTE — Assessment & Plan Note (Signed)
Patient is doing significantly better after having the Synvisc supplementation. Patient encouraged to continue the exercises as well as icing. Patient may have a repeat injection starting in March if necessary. Patient will followup with me on an as-needed basis.

## 2014-03-05 NOTE — Progress Notes (Signed)
  [name] [address] [place], [place] [address] Phone: [phone] Subjective:     CC:  1 month follow up after synvisc  injections  [id number] [name] is a 80 y.o. female coming in with complaint of multiple joint pains especially in her knee she states. Patient states that her knees have been hurting her somewhat she has not been walking anymore. Patient states that she has been in a wheelchair for a significant amount of time. Patient did have Synvisc injections. Patient states that her pain is significantly better. Patient states her knees seem to be almost the only joint that does not hurt on her. Patient states that she has been able to actually stand up on a much easier basis secondary to her not having the pain in her knees. Patient is very happy with the results.     Past medical history, social, surgical and family history all reviewed in electronic medical record.   Review of Systems: No headache, visual changes, nausea, vomiting, diarrhea, constipation, dizziness, abdominal pain, skin rash, fevers, chills, night sweats, weight loss, swollen lymph nodes, body aches, joint swelling, muscle aches, chest pain, shortness of breath, mood changes.   Objective Blood pressure 90/62, pulse 71, height 5\' 6"  (1.676 m), weight 103 lb (46.72 kg), SpO2 95.00%.  General: No apparent distress alert and oriented x3 mood and affect normal, dressed appropriately. Frail, patient does have some mild attention span deficit patient is in a wheelchair HEENT: Pupils equal, extraocular movements intact  Respiratory: Patient's speak in full sentences and does not appear short of breath  Cardiovascular: No lower extremity edema, non tender, no erythema  Skin: Some dry skin but no bruising noted Abdomen: Soft nontender  Neuro: Cranial nerves II through XII are intact, neurovascularly intact in all extremities with 2+ DTRs and 2+ pulses.  Lymph: No lymphadenopathy of  posterior or anterior cervical chain or axillae bilaterally.  Gait normal with good balance and coordination.  MSK:  Non tender with moderate range of motion and good stability and symmetric strength and tone of shoulders, elbows, wrist, hip, knee and ankles bilaterally. Severe osteoarthritic changes of multiple joints. Patient is tender in multiple joints with decreased range of motion. Knee: Bilateral Patient has significant atrophy of all muscles. Only minimal tenderness over the medial joint line ROM full in flexion and extension and lower leg rotation. Ligaments with solid consistent endpoints including ACL, PCL, LCL, MCL. Patellar glide with moderate crepitus. Patellar and quadriceps tendons unremarkable. Hamstring and quadriceps strength significantly decreased but symmetric    Impression and Recommendations:     This case required medical decision making of moderate complexity.

## 2014-03-05 NOTE — Patient Instructions (Signed)
Good to see you i am so glad you are doing better.  Ice is your friend Can repeat injections in march if needed. Can see me sooner if pain come back.

## 2014-03-26 ENCOUNTER — Telehealth: Payer: Self-pay | Admitting: Internal Medicine

## 2014-03-26 NOTE — Telephone Encounter (Signed)
Spoke to [name], she is requesting an appt to see dr Tamala Julian to have another knee injection. appt made for 11.12.2015.

## 2014-03-26 NOTE — Telephone Encounter (Signed)
Pt request phone called from the assistant concern about her knee. Please give her a call back

## 2014-04-01 ENCOUNTER — Encounter: Payer: Self-pay | Admitting: Family Medicine

## 2014-04-01 ENCOUNTER — Ambulatory Visit (INDEPENDENT_AMBULATORY_CARE_PROVIDER_SITE_OTHER): Payer: Medicare Other | Admitting: Family Medicine

## 2014-04-01 ENCOUNTER — Telehealth: Payer: Self-pay

## 2014-04-01 VITALS — BP 122/80 | HR 61

## 2014-04-01 DIAGNOSIS — M1710 Unilateral primary osteoarthritis, unspecified knee: Secondary | ICD-10-CM

## 2014-04-01 NOTE — Patient Instructions (Signed)
Good to see you I am sorry for your knee.  I think the fall did hurt the knee some and made the arthritis worse.  We will continue the icing and did an injection today. I cannot do the pain medicines and would Have Dr. Carlean Jews fill them on Thursday, I will send her a message.  See me in 1 month.
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
## 2014-04-01 NOTE — Assessment & Plan Note (Signed)
Patient was given an injection today with some good resolution of pain. We did discuss possible patient's pain medications which he states she is out and needs refills. Discuss with her that this needs to be done by primary care provider who will be in the office on Thursday. Discussed icing regimen. Patient will do some of the range of motion exercises she's been given previously. We once again discussed the possibility of formal physical therapy which patient declined. Patient come back in 4 weeks for further evaluation. This is a difficult situation.  Spent greater than 25 minutes with patient face-to-face and had greater than 50% of counseling including as described above in assessment and plan.

## 2014-04-01 NOTE — Progress Notes (Signed)
  [name] [address] [place], [place] [address] Phone: [phone] Subjective:     CC:  Worsening knee pain  [id number] [name] is a 80 y.o. female coming in with complaint of multiple joint pains especially in her knees.  Patient was seen 2 weeks ago and was doing remarkably well after her Synvisc treatment. Patient states though now that she is 6 weeks since then she started having increasing pain again. Patient states she did have a fall and she did hit the knee. Patient denies any swelling or any bruising. Patient states though that is a more of a dull aching throbbing sensation. Patient states that it feels very similar to prior to her injections. Patient states that the left knee though is doing relatively well.      Past medical history, social, surgical and family history all reviewed in electronic medical record.   Review of Systems: No headache, visual changes, nausea, vomiting, diarrhea, constipation, dizziness, abdominal pain, skin rash, fevers, chills, night sweats, weight loss, swollen lymph nodes, body aches, joint swelling, muscle aches, chest pain, shortness of breath, mood changes.   Objective Blood pressure 122/80, pulse 61, SpO2 93 %.  General: No apparent distress alert and oriented x3 mood and affect normal, dressed appropriately. Frail, patient does have some mild attention span deficit patient is in a wheelchair HEENT: Pupils equal, extraocular movements intact  Respiratory: Patient's speak in full sentences and does not appear short of breath  Cardiovascular: No lower extremity edema, non tender, no erythema  Skin: Some dry skin but no bruising noted Abdomen: Soft nontender  Neuro: Cranial nerves II through XII are intact, neurovascularly intact in all extremities with 2+ DTRs and 2+ pulses.  Lymph: No lymphadenopathy of posterior or anterior cervical chain or axillae bilaterally.  Gait normal with good balance and  coordination.  MSK:  Non tender with moderate range of motion and good stability and symmetric strength and tone of shoulders, elbows, wrist, hip, knee and ankles bilaterally. Severe osteoarthritic changes of multiple joints. Patient is tender in multiple joints with decreased range of motion. Knee: Bilateral Patient has significant atrophy of all muscles. Severe tenderness to palpation on the medial joint line on right and less on left.  ROM full in flexion and extension and lower leg rotation. Ligaments with solid consistent endpoints including ACL, PCL, LCL, MCL. Patellar glide with moderate crepitus. Patellar and quadriceps tendons unremarkable. Hamstring and quadriceps strength significantly decreased but symmetric No significant change from previous exam  After informed written and verbal consent, patient was seated on exam table. Right knee was prepped with alcohol swab and utilizing anterolateral approach, patient's right knee space was injected with 4:1  marcaine 0.5%: Kenalog 40mg /dL. Patient tolerated the procedure well without immediate complications.   Impression and Recommendations:     This case required medical decision making of moderate complexity.

## 2014-04-02 ENCOUNTER — Telehealth: Payer: Self-pay | Admitting: Internal Medicine

## 2014-04-02 NOTE — Telephone Encounter (Signed)
Pt informed that rx was not approved by any other provider here in the office and that MD will return tomorrow morning.

## 2014-04-02 NOTE — Telephone Encounter (Signed)
Needs script for oxycodone.  Patient runs out today.  Patient states she has called before.

## 2014-04-02 NOTE — Telephone Encounter (Signed)
I was present and agree with the above statement.  Patient very understanding and husband was not.

## 2014-04-03 ENCOUNTER — Other Ambulatory Visit: Payer: Self-pay

## 2014-04-03 ENCOUNTER — Other Ambulatory Visit: Payer: Self-pay | Admitting: Internal Medicine

## 2014-04-03 MED ORDER — OXYCODONE-ACETAMINOPHEN 10-325 MG PO TABS: 1.0000 | ORAL_TABLET | Freq: Four times a day (QID) | ORAL | Status: DC | PRN

## 2014-04-04 ENCOUNTER — Ambulatory Visit: Payer: Medicare Other | Admitting: Family Medicine

## 2014-04-04 ENCOUNTER — Telehealth: Payer: Self-pay
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
## 2014-04-04 NOTE — Telephone Encounter (Signed)
Pt called and stated the following:   Pt was wondering why she needed to provide a sample. I explained that a UDS is required at any time the physician and [employer] deemed necessary. I also explained to the pt that the orders for the UDS came directly from PCP.  Pt also wanted to know if the determination is that pt is dismissed could it be reconsidered if she was able to keep her husband off the premises.
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
## 2014-04-07 NOTE — Telephone Encounter (Signed)
Dismal letter generated and the dismal form completed. MD has signed and was given to [employer].

## 2014-04-08 ENCOUNTER — Telehealth: Payer: Self-pay | Admitting: Internal Medicine

## 2014-04-08 NOTE — Telephone Encounter (Signed)
Dismissal Letter sent by [employer] Mail [id number]/34/1960  Received the Return Receipt showing someone picked up Dismissal 04/14/2014

## 2014-04-15 ENCOUNTER — Ambulatory Visit: Payer: Medicare Other | Admitting: Internal Medicine

## 2014-04-16 ENCOUNTER — Telehealth: Payer: Self-pay | Admitting: Internal Medicine

## 2014-04-17 ENCOUNTER — Encounter: Payer: Self-pay | Admitting: Internal Medicine

## 2014-04-18 ENCOUNTER — Telehealth: Payer: Self-pay | Admitting: Internal Medicine

## 2014-05-02 ENCOUNTER — Telehealth: Payer: Self-pay | Admitting: Internal Medicine

## 2014-05-02 NOTE — Telephone Encounter (Signed)
Called pt and informed that provider would be in on Monday to sign last refill pt will receive from EX PCP (since the pt has been dismissed. .  Pt was upset that she would run out of meds this weekend. Apologized for any inconvenience and stated that in the event that pain is unbearable pt would need to visit the ER.  Pt stated dissatisfaction that the rx was not printed earlier. I informed pt that the refill policy has always been 24 to 48 hours and our ability to refill being done same day is only possible if the PCP is available and if not other providers agree to sign. Informed pt I would call her back on Monday.

## 2014-05-02 NOTE — Telephone Encounter (Signed)
Pt called in and said that she thought that Dr Asa Lente said that she would fill her meds one more time on the 19th.  Hydrocondone and her Patches.

## 2014-05-05 ENCOUNTER — Telehealth: Payer: Self-pay

## 2014-05-05 MED ORDER — FENTANYL 50 MCG/HR TD PT72: 50.0000 ug | MEDICATED_PATCH | TRANSDERMAL | Status: DC

## 2014-05-05 MED ORDER — OXYCODONE-ACETAMINOPHEN 10-325 MG PO TABS: 1.0000 | ORAL_TABLET | Freq: Four times a day (QID) | ORAL | Status: DC | PRN

## 2014-05-05 NOTE — Telephone Encounter (Signed)
Pt spouse came in and was upset that rx was prescribed for the 15 days remaining for PCP care. He was informed that the pt stated that her appt with new physician was on 05/20/14. He stated that that was not accurate. Pt appt is on 06/05/14.

## 2014-05-05 NOTE — Telephone Encounter (Signed)
Refills thru 06/05/14 ok thanks

## 2014-05-05 NOTE — Telephone Encounter (Signed)
Called [facility] and pt does have an appt with them on 06/05/14

## 2014-05-06 MED ORDER — OXYCODONE-ACETAMINOPHEN 10-325 MG PO TABS: 1.0000 | ORAL_TABLET | Freq: Four times a day (QID) | ORAL | Status: DC | PRN

## 2014-05-06 NOTE — Telephone Encounter (Signed)
Pt informed that rx is ready for pick up in the office.

## 2014-05-06 NOTE — Addendum Note (Signed)
Addended by: Lowella Dandy on: 05/06/2014 08:33 AM   Modules accepted: Orders

## 2014-05-26 NOTE — Telephone Encounter (Signed)
na

## 2014-05-29 ENCOUNTER — Other Ambulatory Visit: Payer: Self-pay | Admitting: Internal Medicine

## 2014-06-06 ENCOUNTER — Other Ambulatory Visit: Payer: Self-pay | Admitting: Internal Medicine

## 2014-06-12 ENCOUNTER — Other Ambulatory Visit: Payer: Self-pay | Admitting: Internal Medicine

## 2014-06-13 ENCOUNTER — Ambulatory Visit: Payer: Medicare Other | Admitting: Internal Medicine

## 2014-06-24 ENCOUNTER — Ambulatory Visit: Payer: Medicare Other | Admitting: Internal Medicine

## 2014-06-26 ENCOUNTER — Encounter (HOSPITAL_COMMUNITY)
Admission: EM | Disposition: A | Discharge status: Skilled Nursing Facility | Payer: Medicare Other | Source: Home / Self Care | Attending: Internal Medicine

## 2014-06-26 ENCOUNTER — Inpatient Hospital Stay (HOSPITAL_COMMUNITY): Payer: Medicare Other

## 2014-06-26 ENCOUNTER — Inpatient Hospital Stay (HOSPITAL_COMMUNITY)
Admission: EM | Admit: 2014-06-26 | Discharge: 2014-06-30 | DRG: 480 | Disposition: A | Discharge status: Skilled Nursing Facility | Payer: Medicare Other | Attending: Internal Medicine | Admitting: Internal Medicine

## 2014-06-26 ENCOUNTER — Encounter (HOSPITAL_COMMUNITY): Payer: Self-pay

## 2014-06-26 ENCOUNTER — Emergency Department (HOSPITAL_COMMUNITY): Payer: Medicare Other

## 2014-06-26 ENCOUNTER — Other Ambulatory Visit: Payer: Self-pay

## 2014-06-26 ENCOUNTER — Inpatient Hospital Stay (HOSPITAL_COMMUNITY): Payer: Medicare Other | Admitting: Certified Registered Nurse Anesthetist

## 2014-06-26 DIAGNOSIS — S72141A Displaced intertrochanteric fracture of right femur, initial encounter for closed fracture: Principal | ICD-10-CM | POA: Diagnosis present

## 2014-06-26 DIAGNOSIS — I4891 Unspecified atrial fibrillation: Secondary | ICD-10-CM | POA: Diagnosis present

## 2014-06-26 DIAGNOSIS — M5136 Other intervertebral disc degeneration, lumbar region: Secondary | ICD-10-CM | POA: Diagnosis present

## 2014-06-26 DIAGNOSIS — I10 Essential (primary) hypertension: Secondary | ICD-10-CM

## 2014-06-26 DIAGNOSIS — Z79899 Other long term (current) drug therapy: Secondary | ICD-10-CM | POA: Diagnosis not present

## 2014-06-26 DIAGNOSIS — W19XXXA Unspecified fall, initial encounter: Secondary | ICD-10-CM | POA: Diagnosis present

## 2014-06-26 DIAGNOSIS — W1830XA Fall on same level, unspecified, initial encounter: Secondary | ICD-10-CM | POA: Diagnosis present

## 2014-06-26 DIAGNOSIS — G8929 Other chronic pain: Secondary | ICD-10-CM | POA: Diagnosis present

## 2014-06-26 DIAGNOSIS — Y92002 Bathroom of unspecified non-institutional (private) residence single-family (private) house as the place of occurrence of the external cause: Secondary | ICD-10-CM

## 2014-06-26 DIAGNOSIS — I480 Paroxysmal atrial fibrillation: Secondary | ICD-10-CM | POA: Diagnosis present

## 2014-06-26 DIAGNOSIS — E43 Unspecified severe protein-calorie malnutrition: Secondary | ICD-10-CM

## 2014-06-26 DIAGNOSIS — Z7982 Long term (current) use of aspirin: Secondary | ICD-10-CM | POA: Diagnosis not present

## 2014-06-26 DIAGNOSIS — Z66 Do not resuscitate: Secondary | ICD-10-CM | POA: Diagnosis present

## 2014-06-26 DIAGNOSIS — M545 Low back pain, unspecified: Secondary | ICD-10-CM | POA: Diagnosis present

## 2014-06-26 DIAGNOSIS — F0390 Unspecified dementia without behavioral disturbance: Secondary | ICD-10-CM | POA: Diagnosis present

## 2014-06-26 DIAGNOSIS — Z9181 History of falling: Secondary | ICD-10-CM

## 2014-06-26 DIAGNOSIS — E119 Type 2 diabetes mellitus without complications: Secondary | ICD-10-CM | POA: Diagnosis present

## 2014-06-26 DIAGNOSIS — M5030 Other cervical disc degeneration, unspecified cervical region: Secondary | ICD-10-CM | POA: Diagnosis present

## 2014-06-26 DIAGNOSIS — F17210 Nicotine dependence, cigarettes, uncomplicated: Secondary | ICD-10-CM | POA: Diagnosis present

## 2014-06-26 DIAGNOSIS — I2510 Atherosclerotic heart disease of native coronary artery without angina pectoris: Secondary | ICD-10-CM | POA: Diagnosis present

## 2014-06-26 DIAGNOSIS — D62 Acute posthemorrhagic anemia: Secondary | ICD-10-CM | POA: Diagnosis not present

## 2014-06-26 DIAGNOSIS — F1010 Alcohol abuse, uncomplicated: Secondary | ICD-10-CM | POA: Diagnosis present

## 2014-06-26 DIAGNOSIS — S72001A Fracture of unspecified part of neck of right femur, initial encounter for closed fracture: Secondary | ICD-10-CM

## 2014-06-26 DIAGNOSIS — Z720 Tobacco use: Secondary | ICD-10-CM | POA: Diagnosis present

## 2014-06-26 DIAGNOSIS — Z681 Body mass index (BMI) 19 or less, adult: Secondary | ICD-10-CM | POA: Diagnosis not present

## 2014-06-26 DIAGNOSIS — F329 Major depressive disorder, single episode, unspecified: Secondary | ICD-10-CM | POA: Diagnosis present

## 2014-06-26 DIAGNOSIS — M25551 Pain in right hip: Secondary | ICD-10-CM | POA: Diagnosis present

## 2014-06-26 DIAGNOSIS — I252 Old myocardial infarction: Secondary | ICD-10-CM

## 2014-06-26 DIAGNOSIS — F418 Other specified anxiety disorders: Secondary | ICD-10-CM | POA: Diagnosis present

## 2014-06-26 DIAGNOSIS — Z419 Encounter for procedure for purposes other than remedying health state, unspecified: Secondary | ICD-10-CM

## 2014-06-26 DIAGNOSIS — F341 Dysthymic disorder: Secondary | ICD-10-CM | POA: Diagnosis present

## 2014-06-26 DIAGNOSIS — J449 Chronic obstructive pulmonary disease, unspecified: Secondary | ICD-10-CM | POA: Diagnosis present

## 2014-06-26 DIAGNOSIS — E785 Hyperlipidemia, unspecified: Secondary | ICD-10-CM | POA: Diagnosis present

## 2014-06-26 DIAGNOSIS — Z01811 Encounter for preprocedural respiratory examination: Secondary | ICD-10-CM

## 2014-06-26 HISTORY — DX: Tobacco use: Z72.0

## 2014-06-26 HISTORY — DX: Unspecified severe protein-calorie malnutrition: E43

## 2014-06-26 HISTORY — PX: INTRAMEDULLARY (IM) NAIL INTERTROCHANTERIC: SHX5875

## 2014-06-26 HISTORY — DX: Displaced intertrochanteric fracture of right femur, initial encounter for closed fracture: S72.141A

## 2014-06-26 LAB — COMPREHENSIVE METABOLIC PANEL
ALK PHOS: 42 U/L (ref 39–117)
ALT: 16 U/L (ref 0–35)
AST: 25 U/L (ref 0–37)
Albumin: 4.1 g/dL (ref 3.5–5.2)
Anion gap: 7 (ref 5–15)
BUN: 13 mg/dL (ref 6–23)
CHLORIDE: 101 mmol/L (ref 96–112)
CO2: 31 mmol/L (ref 19–32)
Calcium: 9.2 mg/dL (ref 8.4–10.5)
Creatinine, Ser: 0.5 mg/dL (ref 0.50–1.10)
GFR calc non Af Amer: 90 mL/min — ABNORMAL LOW (ref >90)
GLUCOSE: 94 mg/dL (ref 70–99)
POTASSIUM: 3.8 mmol/L (ref 3.5–5.1)
SODIUM: 139 mmol/L (ref 135–145)
Total Bilirubin: 0.5 mg/dL (ref 0.3–1.2)
Total Protein: 6.7 g/dL (ref 6.0–8.3)

## 2014-06-26 LAB — APTT
aPTT: 31 seconds (ref 24–37)
aPTT: 31 seconds (ref 24–37)

## 2014-06-26 LAB — URINALYSIS, ROUTINE W REFLEX MICROSCOPIC
Bilirubin Urine: NEGATIVE
Glucose, UA: NEGATIVE mg/dL
HGB URINE DIPSTICK: NEGATIVE
Ketones, ur: 15 mg/dL — AB
Leukocytes, UA: NEGATIVE
Nitrite: NEGATIVE
PROTEIN: NEGATIVE mg/dL
Specific Gravity, Urine: 1.016 (ref 1.005–1.030)
UROBILINOGEN UA: 0.2 mg/dL (ref 0.0–1.0)
pH: 8 (ref 5.0–8.0)

## 2014-06-26 LAB — CBC WITH DIFFERENTIAL/PLATELET
BASOS ABS: 0 10*3/uL (ref 0.0–0.1)
Basophils Relative: 0 % (ref 0–1)
Eosinophils Absolute: 0.3 10*3/uL (ref 0.0–0.7)
Eosinophils Relative: 3 % (ref 0–5)
HCT: 38.8 % (ref 36.0–46.0)
HEMOGLOBIN: 12.4 g/dL (ref 12.0–15.0)
LYMPHS PCT: 27 % (ref 12–46)
Lymphs Abs: 2.2 10*3/uL (ref 0.7–4.0)
MCH: 34.5 pg — ABNORMAL HIGH (ref 26.0–34.0)
MCHC: 32 g/dL (ref 30.0–36.0)
MCV: 108.1 fL — ABNORMAL HIGH (ref 78.0–100.0)
Monocytes Absolute: 0.8 10*3/uL (ref 0.1–1.0)
Monocytes Relative: 10 % (ref 3–12)
NEUTROS ABS: 4.8 10*3/uL (ref 1.7–7.7)
NEUTROS PCT: 60 % (ref 43–77)
Platelets: 197 10*3/uL (ref 150–400)
RBC: 3.59 MIL/uL — ABNORMAL LOW (ref 3.87–5.11)
RDW: 12.4 % (ref 11.5–15.5)
WBC: 8.1 10*3/uL (ref 4.0–10.5)

## 2014-06-26 LAB — GLUCOSE, CAPILLARY
GLUCOSE-CAPILLARY: 99 mg/dL (ref 70–99)
GLUCOSE-CAPILLARY: 96 mg/dL (ref 70–99)
Glucose-Capillary: 99 mg/dL (ref 70–99)
Glucose-Capillary: 113 mg/dL — ABNORMAL HIGH (ref 70–99)

## 2014-06-26 LAB — TROPONIN I

## 2014-06-26 LAB — SURGICAL PCR SCREEN
MRSA, PCR: NEGATIVE
Staphylococcus aureus: NEGATIVE

## 2014-06-26 LAB — PROTIME-INR
INR: 1.01 (ref 0.00–1.49)
PROTHROMBIN TIME: 13.4 s (ref 11.6–15.2)

## 2014-06-26 SURGERY — FIXATION, FRACTURE, INTERTROCHANTERIC, WITH INTRAMEDULLARY ROD
Anesthesia: General | Site: Hip | Laterality: Right

## 2014-06-26 MED ORDER — ASPIRIN EC 81 MG PO TBEC
81.0000 mg | DELAYED_RELEASE_TABLET | Freq: Every day | ORAL | Status: DC
  Administered 2014-06-27 – 2014-06-30 (×4): 81 mg via ORAL
  Filled 2014-06-26 (×6): qty 1

## 2014-06-26 MED ORDER — LORAZEPAM 1 MG PO TABS
1.0000 mg | ORAL_TABLET | Freq: Four times a day (QID) | ORAL | Status: AC | PRN
  Administered 2014-06-26 – 2014-06-27 (×3): 1 mg via ORAL
  Filled 2014-06-26 (×3): qty 1

## 2014-06-26 MED ORDER — ACETAMINOPHEN 325 MG PO TABS
650.0000 mg | ORAL_TABLET | Freq: Four times a day (QID) | ORAL | Status: DC | PRN
Start: 2014-06-26 — End: 2014-06-30
  Administered 2014-06-28: 650 mg via ORAL
  Filled 2014-06-26: qty 2

## 2014-06-26 MED ORDER — ZOLPIDEM TARTRATE 5 MG PO TABS
5.0000 mg | ORAL_TABLET | Freq: Every evening | ORAL | Status: DC | PRN
  Administered 2014-06-27 – 2014-06-29 (×2): 5 mg via ORAL
  Filled 2014-06-26 (×2): qty 1

## 2014-06-26 MED ORDER — METHOCARBAMOL 1000 MG/10ML IJ SOLN
500.0000 mg | Freq: Four times a day (QID) | INTRAVENOUS | Status: DC | PRN
  Administered 2014-06-27: 500 mg via INTRAVENOUS
  Filled 2014-06-26 (×2): qty 5

## 2014-06-26 MED ORDER — HYDROMORPHONE HCL 1 MG/ML IJ SOLN: 1.0000 mg | Freq: Once | INTRAMUSCULAR | Status: DC

## 2014-06-26 MED ORDER — ONDANSETRON HCL 4 MG/2ML IJ SOLN
INTRAMUSCULAR | Status: AC
  Filled 2014-06-26: qty 2

## 2014-06-26 MED ORDER — DEXAMETHASONE SODIUM PHOSPHATE 10 MG/ML IJ SOLN
INTRAMUSCULAR | Status: AC
  Filled 2014-06-26: qty 1

## 2014-06-26 MED ORDER — SODIUM CHLORIDE 0.9 % IV SOLN
INTRAVENOUS | Status: DC
  Administered 2014-06-26: 21:00:00 via INTRAVENOUS

## 2014-06-26 MED ORDER — LIDOCAINE HCL (CARDIAC) 20 MG/ML IV SOLN
INTRAVENOUS | Status: AC
  Filled 2014-06-26: qty 5

## 2014-06-26 MED ORDER — THIAMINE HCL 100 MG/ML IJ SOLN
100.0000 mg | Freq: Every day | INTRAMUSCULAR | Status: DC
  Filled 2014-06-26 (×5): qty 1

## 2014-06-26 MED ORDER — ENOXAPARIN SODIUM 40 MG/0.4ML ~~LOC~~ SOLN
40.0000 mg | SUBCUTANEOUS | Status: DC
  Administered 2014-06-27: 40 mg via SUBCUTANEOUS
  Filled 2014-06-26 (×2): qty 0.4

## 2014-06-26 MED ORDER — HYDROMORPHONE HCL 1 MG/ML IJ SOLN: 0.5000 mg | INTRAMUSCULAR | Status: DC | PRN

## 2014-06-26 MED ORDER — OXYCODONE-ACETAMINOPHEN 10-325 MG PO TABS: 1.0000 | ORAL_TABLET | Freq: Four times a day (QID) | ORAL | Status: DC | PRN

## 2014-06-26 MED ORDER — MENTHOL 3 MG MT LOZG
1.0000 | LOZENGE | OROMUCOSAL | Status: DC | PRN
  Filled 2014-06-26: qty 9

## 2014-06-26 MED ORDER — FOLIC ACID 1 MG PO TABS
1.0000 mg | ORAL_TABLET | Freq: Every day | ORAL | Status: DC
  Administered 2014-06-27 – 2014-06-30 (×4): 1 mg via ORAL
  Filled 2014-06-26 (×5): qty 1

## 2014-06-26 MED ORDER — HEPARIN SODIUM (PORCINE) 5000 UNIT/ML IJ SOLN
5000.0000 [IU] | Freq: Three times a day (TID) | INTRAMUSCULAR | Status: DC
  Filled 2014-06-26 (×4): qty 1

## 2014-06-26 MED ORDER — ONDANSETRON HCL 4 MG PO TABS: 4.0000 mg | ORAL_TABLET | Freq: Four times a day (QID) | ORAL | Status: DC | PRN

## 2014-06-26 MED ORDER — PRAVASTATIN SODIUM 80 MG PO TABS
80.0000 mg | ORAL_TABLET | Freq: Every day | ORAL | Status: DC
Start: 2014-06-26 — End: 2014-06-30
  Administered 2014-06-27 – 2014-06-29 (×3): 80 mg via ORAL
  Filled 2014-06-26 (×5): qty 1

## 2014-06-26 MED ORDER — TEMAZEPAM 15 MG PO CAPS: 30.0000 mg | ORAL_CAPSULE | Freq: Every evening | ORAL | Status: DC | PRN

## 2014-06-26 MED ORDER — VITAMIN D 1000 UNITS PO TABS
2000.0000 [IU] | ORAL_TABLET | Freq: Every day | ORAL | Status: DC
  Administered 2014-06-27 – 2014-06-30 (×4): 2000 [IU] via ORAL
  Filled 2014-06-26 (×5): qty 2

## 2014-06-26 MED ORDER — LORAZEPAM 2 MG/ML IJ SOLN
0.0000 mg | Freq: Four times a day (QID) | INTRAMUSCULAR | Status: AC
  Administered 2014-06-26: 1 mg via INTRAVENOUS
  Administered 2014-06-27: 2 mg via INTRAVENOUS
  Filled 2014-06-26 (×2): qty 1

## 2014-06-26 MED ORDER — HYDROMORPHONE HCL 2 MG/ML IJ SOLN
INTRAMUSCULAR | Status: AC
  Filled 2014-06-26: qty 1

## 2014-06-26 MED ORDER — METOCLOPRAMIDE HCL 10 MG PO TABS: 5.0000 mg | ORAL_TABLET | Freq: Three times a day (TID) | ORAL | Status: DC | PRN

## 2014-06-26 MED ORDER — FENTANYL CITRATE 0.05 MG/ML IJ SOLN
INTRAMUSCULAR | Status: DC | PRN
  Administered 2014-06-26 (×4): 25 ug via INTRAVENOUS

## 2014-06-26 MED ORDER — LIDOCAINE HCL (CARDIAC) 20 MG/ML IV SOLN
INTRAVENOUS | Status: DC | PRN
  Administered 2014-06-26: 80 mg via INTRAVENOUS

## 2014-06-26 MED ORDER — MEMANTINE HCL ER 14 MG PO CP24
14.0000 mg | ORAL_CAPSULE | Freq: Every day | ORAL | Status: DC
  Administered 2014-06-26 – 2014-06-30 (×5): 14 mg via ORAL
  Filled 2014-06-26 (×5): qty 1

## 2014-06-26 MED ORDER — POLYETHYLENE GLYCOL 3350 17 GM/SCOOP PO POWD
17.0000 g | ORAL | Status: DC | PRN
  Filled 2014-06-26: qty 255

## 2014-06-26 MED ORDER — INSULIN ASPART 100 UNIT/ML ~~LOC~~ SOLN
0.0000 [IU] | Freq: Three times a day (TID) | SUBCUTANEOUS | Status: DC
  Administered 2014-06-28: 1 [IU] via SUBCUTANEOUS
  Administered 2014-06-28: 5 [IU] via SUBCUTANEOUS
  Administered 2014-06-29: 1 [IU] via SUBCUTANEOUS
  Administered 2014-06-29: 2 [IU] via SUBCUTANEOUS
  Administered 2014-06-30: 1 [IU] via SUBCUTANEOUS

## 2014-06-26 MED ORDER — ROCURONIUM BROMIDE 100 MG/10ML IV SOLN
INTRAVENOUS | Status: AC
  Filled 2014-06-26: qty 1

## 2014-06-26 MED ORDER — LORAZEPAM 2 MG/ML IJ SOLN: 0.0000 mg | Freq: Two times a day (BID) | INTRAMUSCULAR | Status: AC

## 2014-06-26 MED ORDER — ONDANSETRON HCL 4 MG/2ML IJ SOLN: 4.0000 mg | Freq: Four times a day (QID) | INTRAMUSCULAR | Status: DC | PRN

## 2014-06-26 MED ORDER — ONDANSETRON HCL 4 MG/2ML IJ SOLN
4.0000 mg | Freq: Four times a day (QID) | INTRAMUSCULAR | Status: DC | PRN
  Administered 2014-06-26: 4 mg via INTRAVENOUS
  Filled 2014-06-26: qty 2

## 2014-06-26 MED ORDER — PROPOFOL 10 MG/ML IV BOLUS
INTRAVENOUS | Status: DC | PRN
  Administered 2014-06-26: 100 mg via INTRAVENOUS

## 2014-06-26 MED ORDER — CEFAZOLIN SODIUM-DEXTROSE 2-3 GM-% IV SOLR
INTRAVENOUS | Status: AC
  Filled 2014-06-26: qty 50

## 2014-06-26 MED ORDER — DIAZEPAM 5 MG PO TABS
5.0000 mg | ORAL_TABLET | Freq: Four times a day (QID) | ORAL | Status: DC | PRN
  Administered 2014-06-26 – 2014-06-30 (×6): 5 mg via ORAL
  Filled 2014-06-26 (×6): qty 1

## 2014-06-26 MED ORDER — NEOSTIGMINE METHYLSULFATE 10 MG/10ML IV SOLN
INTRAVENOUS | Status: AC
  Filled 2014-06-26: qty 1

## 2014-06-26 MED ORDER — OXYCODONE HCL 5 MG PO TABS
5.0000 mg | ORAL_TABLET | Freq: Four times a day (QID) | ORAL | Status: DC | PRN
  Administered 2014-06-26: 5 mg via ORAL
  Filled 2014-06-26 (×2): qty 1

## 2014-06-26 MED ORDER — ADULT MULTIVITAMIN W/MINERALS CH
1.0000 | ORAL_TABLET | Freq: Every day | ORAL | Status: DC
  Administered 2014-06-27 – 2014-06-30 (×4): 1 via ORAL
  Filled 2014-06-26 (×5): qty 1

## 2014-06-26 MED ORDER — FESOTERODINE FUMARATE ER 4 MG PO TB24
4.0000 mg | ORAL_TABLET | Freq: Every day | ORAL | Status: DC
  Administered 2014-06-26 – 2014-06-30 (×5): 4 mg via ORAL
  Filled 2014-06-26 (×5): qty 1

## 2014-06-26 MED ORDER — FENTANYL CITRATE 0.05 MG/ML IJ SOLN
25.0000 ug | INTRAMUSCULAR | Status: DC | PRN
Start: 2014-06-26 — End: 2014-06-26

## 2014-06-26 MED ORDER — VERAPAMIL HCL ER 180 MG PO TBCR
180.0000 mg | EXTENDED_RELEASE_TABLET | Freq: Two times a day (BID) | ORAL | Status: DC
  Administered 2014-06-26 – 2014-06-29 (×6): 180 mg via ORAL
  Filled 2014-06-26 (×10): qty 1

## 2014-06-26 MED ORDER — GLYCOPYRROLATE 0.2 MG/ML IJ SOLN
INTRAMUSCULAR | Status: AC
  Filled 2014-06-26: qty 2

## 2014-06-26 MED ORDER — VITAMIN B-1 100 MG PO TABS
100.0000 mg | ORAL_TABLET | Freq: Every day | ORAL | Status: DC
  Administered 2014-06-27 – 2014-06-30 (×4): 100 mg via ORAL
  Filled 2014-06-26 (×5): qty 1

## 2014-06-26 MED ORDER — NEOSTIGMINE METHYLSULFATE 10 MG/10ML IV SOLN
INTRAVENOUS | Status: DC | PRN
  Administered 2014-06-26: 3 mg via INTRAVENOUS

## 2014-06-26 MED ORDER — METOPROLOL TARTRATE 12.5 MG HALF TABLET
12.5000 mg | ORAL_TABLET | Freq: Two times a day (BID) | ORAL | Status: DC
  Administered 2014-06-26 – 2014-06-30 (×8): 12.5 mg via ORAL
  Filled 2014-06-26 (×10): qty 1

## 2014-06-26 MED ORDER — ACETAMINOPHEN 650 MG RE SUPP
650.0000 mg | Freq: Four times a day (QID) | RECTAL | Status: DC | PRN
Start: 2014-06-26 — End: 2014-06-30

## 2014-06-26 MED ORDER — HYDROMORPHONE HCL 1 MG/ML IJ SOLN
INTRAMUSCULAR | Status: DC | PRN
  Administered 2014-06-26 (×2): 0.5 mg via INTRAVENOUS

## 2014-06-26 MED ORDER — HYDROCODONE-ACETAMINOPHEN 5-325 MG PO TABS
1.0000 | ORAL_TABLET | Freq: Four times a day (QID) | ORAL | Status: DC | PRN
  Administered 2014-06-26 – 2014-06-30 (×12): 2 via ORAL
  Filled 2014-06-26 (×13): qty 2

## 2014-06-26 MED ORDER — SUCCINYLCHOLINE CHLORIDE 20 MG/ML IJ SOLN
INTRAMUSCULAR | Status: DC | PRN
  Administered 2014-06-26: 100 mg via INTRAVENOUS

## 2014-06-26 MED ORDER — MORPHINE SULFATE 2 MG/ML IJ SOLN: 0.5000 mg | INTRAMUSCULAR | Status: DC | PRN

## 2014-06-26 MED ORDER — B COMPLEX-C PO TABS
1.0000 | ORAL_TABLET | Freq: Every day | ORAL | Status: DC
  Administered 2014-06-27 – 2014-06-30 (×4): 1 via ORAL
  Filled 2014-06-26 (×5): qty 1

## 2014-06-26 MED ORDER — ONDANSETRON HCL 4 MG/2ML IJ SOLN
INTRAMUSCULAR | Status: DC | PRN
  Administered 2014-06-26: 4 mg via INTRAVENOUS

## 2014-06-26 MED ORDER — CEFAZOLIN SODIUM 1-5 GM-% IV SOLN
1.0000 g | Freq: Four times a day (QID) | INTRAVENOUS | Status: AC
  Administered 2014-06-27 (×2): 1 g via INTRAVENOUS
  Filled 2014-06-26 (×2): qty 50

## 2014-06-26 MED ORDER — GLYCOPYRROLATE 0.2 MG/ML IJ SOLN
INTRAMUSCULAR | Status: DC | PRN
  Administered 2014-06-26: 0.4 mg via INTRAVENOUS

## 2014-06-26 MED ORDER — HYDROMORPHONE HCL 1 MG/ML IJ SOLN
0.5000 mg | Freq: Once | INTRAMUSCULAR | Status: AC
  Administered 2014-06-26: 0.5 mg via INTRAVENOUS
  Filled 2014-06-26: qty 1

## 2014-06-26 MED ORDER — LORAZEPAM 2 MG/ML IJ SOLN: 1.0000 mg | Freq: Four times a day (QID) | INTRAMUSCULAR | Status: AC | PRN

## 2014-06-26 MED ORDER — ROCURONIUM BROMIDE 100 MG/10ML IV SOLN
INTRAVENOUS | Status: DC | PRN
  Administered 2014-06-26: 25 mg via INTRAVENOUS

## 2014-06-26 MED ORDER — METHOCARBAMOL 500 MG PO TABS
500.0000 mg | ORAL_TABLET | Freq: Four times a day (QID) | ORAL | Status: DC | PRN
  Administered 2014-06-27 – 2014-06-30 (×7): 500 mg via ORAL
  Filled 2014-06-26 (×9): qty 1

## 2014-06-26 MED ORDER — PROPOFOL 10 MG/ML IV BOLUS
INTRAVENOUS | Status: AC
  Filled 2014-06-26: qty 20

## 2014-06-26 MED ORDER — SODIUM CHLORIDE 0.9 % IJ SOLN
3.0000 mL | Freq: Two times a day (BID) | INTRAMUSCULAR | Status: DC
  Administered 2014-06-26 – 2014-06-30 (×5): 3 mL via INTRAVENOUS

## 2014-06-26 MED ORDER — CEFAZOLIN SODIUM-DEXTROSE 2-3 GM-% IV SOLR
INTRAVENOUS | Status: DC | PRN
  Administered 2014-06-26: 2 g via INTRAVENOUS

## 2014-06-26 MED ORDER — OXYCODONE-ACETAMINOPHEN 5-325 MG PO TABS
1.0000 | ORAL_TABLET | Freq: Four times a day (QID) | ORAL | Status: DC | PRN
  Administered 2014-06-26: 1 via ORAL
  Filled 2014-06-26: qty 1

## 2014-06-26 MED ORDER — DOCUSATE SODIUM 100 MG PO CAPS
100.0000 mg | ORAL_CAPSULE | Freq: Two times a day (BID) | ORAL | Status: DC
  Administered 2014-06-27 – 2014-06-30 (×7): 100 mg via ORAL

## 2014-06-26 MED ORDER — SENNOSIDES-DOCUSATE SODIUM 8.6-50 MG PO TABS: 1.0000 | ORAL_TABLET | Freq: Every evening | ORAL | Status: DC | PRN

## 2014-06-26 MED ORDER — FENTANYL 50 MCG/HR TD PT72
50.0000 ug | MEDICATED_PATCH | TRANSDERMAL | Status: DC
  Administered 2014-06-29: 50 ug via TRANSDERMAL
  Filled 2014-06-26: qty 1

## 2014-06-26 MED ORDER — RAMIPRIL 5 MG PO CAPS
5.0000 mg | ORAL_CAPSULE | Freq: Every day | ORAL | Status: DC
  Administered 2014-06-27: 5 mg via ORAL
  Filled 2014-06-26 (×5): qty 1

## 2014-06-26 MED ORDER — PAROXETINE HCL 20 MG PO TABS
40.0000 mg | ORAL_TABLET | Freq: Every day | ORAL | Status: DC
  Administered 2014-06-27 – 2014-06-30 (×4): 40 mg via ORAL
  Filled 2014-06-26 (×5): qty 2

## 2014-06-26 MED ORDER — FENTANYL CITRATE 0.05 MG/ML IJ SOLN
INTRAMUSCULAR | Status: AC
  Filled 2014-06-26: qty 2

## 2014-06-26 MED ORDER — HYDROMORPHONE HCL 1 MG/ML IJ SOLN
0.5000 mg | INTRAMUSCULAR | Status: DC | PRN
  Administered 2014-06-26 – 2014-06-28 (×7): 0.5 mg via INTRAVENOUS
  Filled 2014-06-26 (×7): qty 1

## 2014-06-26 MED ORDER — LACTATED RINGERS IV SOLN
INTRAVENOUS | Status: DC | PRN
  Administered 2014-06-26: 19:00:00 via INTRAVENOUS

## 2014-06-26 MED ORDER — ADULT MULTIVITAMIN W/MINERALS CH
1.0000 | ORAL_TABLET | Freq: Every day | ORAL | Status: DC
  Filled 2014-06-26: qty 1

## 2014-06-26 MED ORDER — ALUM & MAG HYDROXIDE-SIMETH 200-200-20 MG/5ML PO SUSP: 30.0000 mL | ORAL | Status: DC | PRN

## 2014-06-26 MED ORDER — SODIUM CHLORIDE 0.9 % IV SOLN
INTRAVENOUS | Status: AC
  Administered 2014-06-26: 07:00:00 via INTRAVENOUS

## 2014-06-26 MED ORDER — GLUCERNA SHAKE PO LIQD
237.0000 mL | Freq: Three times a day (TID) | ORAL | Status: DC
  Administered 2014-06-27 – 2014-06-29 (×5): 237 mL via ORAL
  Filled 2014-06-26 (×14): qty 237

## 2014-06-26 MED ORDER — EPHEDRINE SULFATE 50 MG/ML IJ SOLN
INTRAMUSCULAR | Status: DC | PRN
  Administered 2014-06-26: 10 mg via INTRAVENOUS
  Administered 2014-06-26: 5 mg via INTRAVENOUS
  Administered 2014-06-26: 10 mg via INTRAVENOUS
  Administered 2014-06-26: 5 mg via INTRAVENOUS

## 2014-06-26 MED ORDER — 0.9 % SODIUM CHLORIDE (POUR BTL) OPTIME
TOPICAL | Status: DC | PRN
  Administered 2014-06-26: 1000 mL

## 2014-06-26 MED ORDER — METOCLOPRAMIDE HCL 5 MG/ML IJ SOLN
5.0000 mg | Freq: Three times a day (TID) | INTRAMUSCULAR | Status: DC | PRN
Start: 2014-06-26 — End: 2014-06-30

## 2014-06-26 MED ORDER — PHENOL 1.4 % MT LIQD: 1.0000 | OROMUCOSAL | Status: DC | PRN

## 2014-06-26 SURGICAL SUPPLY — 39 items
BAG ZIPLOCK 12X15 (MISCELLANEOUS) ×3 IMPLANT
BIT DRILL CANN LG 4.3MM (BIT) ×1 IMPLANT
BNDG COHESIVE 1X5 TAN STRL LF (GAUZE/BANDAGES/DRESSINGS) ×3 IMPLANT
BNDG GAUZE ELAST 4 BULKY (GAUZE/BANDAGES/DRESSINGS) ×3 IMPLANT
COVER PERINEAL POST (MISCELLANEOUS) ×3 IMPLANT
DRAPE INCISE IOBAN 66X45 STRL (DRAPES) ×3 IMPLANT
DRAPE STERI IOBAN 125X83 (DRAPES) ×3 IMPLANT
DRILL BIT CANN LG 4.3MM (BIT) ×3
DRSG AQUACEL AG ADV 3.5X 4 (GAUZE/BANDAGES/DRESSINGS) IMPLANT
DRSG AQUACEL AG ADV 3.5X 6 (GAUZE/BANDAGES/DRESSINGS) IMPLANT
DRSG MEPILEX BORDER 4X12 (GAUZE/BANDAGES/DRESSINGS) ×3 IMPLANT
DURAPREP 26ML APPLICATOR (WOUND CARE) ×3 IMPLANT
ELECT REM PT RETURN 9FT ADLT (ELECTROSURGICAL) ×3
ELECTRODE REM PT RTRN 9FT ADLT (ELECTROSURGICAL) ×1 IMPLANT
GAUZE SPONGE 2X2 8PLY STRL LF (GAUZE/BANDAGES/DRESSINGS) ×1 IMPLANT
GAUZE SPONGE 4X4 12PLY STRL (GAUZE/BANDAGES/DRESSINGS) ×3 IMPLANT
GLOVE BIOGEL PI IND STRL 7.5 (GLOVE) ×1 IMPLANT
GLOVE BIOGEL PI IND STRL 8.5 (GLOVE) ×1 IMPLANT
GLOVE BIOGEL PI INDICATOR 7.5 (GLOVE) ×2
GLOVE BIOGEL PI INDICATOR 8.5 (GLOVE) ×2
GLOVE ECLIPSE 8.0 STRL XLNG CF (GLOVE) IMPLANT
GLOVE ORTHO TXT STRL SZ7.5 (GLOVE) ×6 IMPLANT
GLOVE SURG ORTHO 8.0 STRL STRW (GLOVE) IMPLANT
GOWN SPEC L3 XXLG W/TWL (GOWN DISPOSABLE) ×6 IMPLANT
GOWN STRL REUS W/TWL LRG LVL3 (GOWN DISPOSABLE) ×3 IMPLANT
GUIDEWIRE BALL NOSE 80CM (WIRE) ×3 IMPLANT
HIP FRA NAIL LAG SCREW 10.5X90 (Orthopedic Implant) ×3 IMPLANT
KIT BASIN OR (CUSTOM PROCEDURE TRAY) ×3 IMPLANT
MANIFOLD NEPTUNE II (INSTRUMENTS) ×3 IMPLANT
NAIL HIP FRACT 130D 11X180 (Screw) ×3 IMPLANT
PACK GENERAL/GYN (CUSTOM PROCEDURE TRAY) ×3 IMPLANT
POSITIONER SURGICAL ARM (MISCELLANEOUS) ×3 IMPLANT
SCREW LAG HIP FRA NAIL 10.5X90 (Orthopedic Implant) ×1 IMPLANT
SPONGE GAUZE 2X2 STER 10/PKG (GAUZE/BANDAGES/DRESSINGS) ×2
STAPLER VISISTAT (STAPLE) ×3 IMPLANT
SUT VIC AB 1 CT1 27 (SUTURE) ×2
SUT VIC AB 1 CT1 27XBRD ANTBC (SUTURE) ×1 IMPLANT
SUT VIC AB 2-0 CT1 27 (SUTURE) ×4
SUT VIC AB 2-0 CT1 27XBRD (SUTURE) ×2 IMPLANT

## 2014-06-26 NOTE — H&P (Signed)
[facility] History and Physical  [name] MHD:[id number] DOB: Jul 12, 1932 DOA: 06/26/2014  Referring physician: ED physician PCP: No primary care provider on file.  Specialists:   Chief Complaint: Right hip pain after fall   HPI: [name] is a 81 y.o. female with past medical history of hypertension, hyperlipidemia, diabetes mellitus, coronary artery disease, COPD, atrial fibrillation not on anticoagulant due to high risk of fall, who presents with right hip after fall.  Patient's husband reports that patient has recurrent and frequent fall at home. She refuses to use walker or cane. At about 2:30 AM, her husband heard calling from the patient in the bathroom. He went to check on her, and found that patient is sitting on the floor and was complaining of pain over right hip. Patient did not lose consciousness. No palpitation or weakness before the event.  Patient denies fever, chills, fatigue, headaches, cough, chest pain, SOB, abdominal pain, diarrhea, dysuria, urgency, frequency, hematuria, skin rashes or leg swelling. No unilateral weakness, numbness or tingling sensations. No vision change or hearing loss.  In ED, patient was found to have right hip fracture by x-ray. CT-head is negative for acute abnormalities. Chest x-ray is negative. INR 1.01. No leukocytosis. Temperature normal. Electrolytes normal. Patient is admitted to inpatient for further evaluation and treatment. Orthopedic surgeon was consulted by ED.  Review of Systems: As presented in the history of presenting illness, rest negative.  Where does patient live?  At home Can patient participate in ADLs? some  Allergy:  Allergies  Allergen Reactions  . Codeine Other (See Comments)    unknown  . Morphine And Related Other (See Comments)    Pt had severe respiratory depression and excessive sedation during a recent hospitalization and was told it was 2/2 morphine.    Past Medical History  Diagnosis Date  . HTN  (hypertension)   . Hyperlipidemia   . Diabetes mellitus, type 2   . Paroxysmal atrial fibrillation     chronic anticoag until 01/2013 - stopped due to high fall risk  . Right bundle branch block and incomplete left bundle branch block     with left axis deviation  . Chronic back pain     narcotic dependence  . Lumbar disc disease     dr. Trenton Gammon  . Mitral valve prolapse   . Macular degeneration   . Gastritis     RUT negative, 2005  . Diverticulosis   . Alcohol abuse   . Compression fracture 02/2011    s/p KP  . Arthritis   . Depression   . Myocardial infarction   . COPD (chronic obstructive pulmonary disease)     Past Surgical History  Procedure Laterality Date  . Right ankle surgery    . Knee arthroscopy      Social History:  reports that she has been smoking Cigarettes.  She has a 32.5 pack-year smoking history. She has never used smokeless tobacco. She reports that she drinks alcohol. She reports that she does not use illicit drugs.  Family History:  Family History  Problem Relation Age of Onset  . Heart disease Mother   . Cancer Father     throat  . Atrial fibrillation Sister      Prior to Admission medications   Medication Sig Start Date End Date Taking? Authorizing Provider  aspirin EC 81 MG tablet Take 1 tablet (81 mg total) by mouth daily. 02/08/13  Yes Rowe Clack, MD  b complex vitamins tablet Take 1 tablet  by mouth daily.   Yes Historical Provider, MD  Cholecalciferol (VITAMIN D) 1000 UNITS capsule Take 2,000 Units by mouth daily.    Yes Historical Provider, MD  diazepam (VALIUM) 5 MG tablet TAKE 1 TABLET BY MOUTH EVERY 6 HOURS AS NEEDED FOR ANXIETY 02/24/14  Yes Rowe Clack, MD  fentaNYL (DURAGESIC - DOSED MCG/HR) 50 MCG/HR Place 1 patch (50 mcg total) onto the skin every 3 (three) days. 05/05/14  Yes Rowe Clack, MD  metFORMIN (GLUCOPHAGE) 500 MG tablet Take 1 tablet (500 mg total) by mouth daily with breakfast. 01/08/14  Yes Rowe Clack, MD  metoprolol tartrate (LOPRESSOR) 25 MG tablet TAKE 1/2 TABLET (12.5MG )  BY MOUTH TWICE DAILY 04/04/14  Yes Deboraha Sprang, MD  Multiple Vitamin (MULTIVITAMIN WITH MINERALS) TABS tablet Take 1 tablet by mouth daily. 01/25/13  Yes Estela Leonie Green, MD  NAMENDA XR 14 MG CP24 TAKE 1 CAPSULE BY MOUTH DAILY.   Yes Rowe Clack, MD  oxyCODONE-acetaminophen (PERCOCET) 10-325 MG per tablet Take 1 tablet by mouth every 6 (six) hours as needed for pain. Last refill indicated. 05/06/14  Yes Rowe Clack, MD  PARoxetine (PAXIL) 40 MG tablet TAKE 1 TABLET (40 MG TOTAL) BY MOUTH EVERY MORNING. 12/10/13  Yes Rowe Clack, MD  polyethylene glycol powder (GLYCOLAX/MIRALAX) powder Take 17 g by mouth daily. 02/08/13  Yes Rowe Clack, MD  pravastatin (PRAVACHOL) 80 MG tablet TAKE ONE TABLET BY MOUTH ONCE A DAY 01/07/14  Yes Deboraha Sprang, MD  ramipril (ALTACE) 5 MG capsule Take 1 capsule (5 mg total) by mouth daily. 11/18/13  Yes Biagio Borg, MD  temazepam (RESTORIL) 30 MG capsule TAKE 1 CAPSULE(S) BY MOUTH AT BEDTIME AS NEEDED   Yes Rowe Clack, MD  tolterodine (DETROL LA) 4 MG 24 hr capsule TAKE ONE CAPSULE BY MOUTH DAILY 12/05/13  Yes Rowe Clack, MD  verapamil (CALAN-SR) 180 MG CR tablet TAKE ONE TABLET BY MOUTH TWICE DAILY 02/25/14  Yes Rowe Clack, MD    Physical Exam: Filed Vitals:   06/26/14 0348 06/26/14 0542 06/26/14 0603 06/26/14 0617  BP: 128/53 150/60 142/59   Pulse: 61 69 66   Temp: 97.7 F (36.5 C)  98.4 F (36.9 C)   TempSrc: Oral  Oral   Resp: 16 16 16    Height:    5\' 5"  (1.651 m)  Weight:    44.906 kg (99 lb)  SpO2: 97% 97% 97%    General: Not in acute distress HEENT:       Eyes: PERRL, EOMI, no scleral icterus       ENT: No discharge from the ears and nose, no pharynx injection, no tonsillar enlargement.        Neck: No JVD, no bruit, no mass felt. Cardiac: D6/L8, RRR, 2/6 systolic murmurs, No gallops or rubs Pulm: Good  air movement bilaterally. Clear to auscultation bilaterally. No rales, wheezing, rhonchi or rubs. Abd: Soft, nondistended, nontender, no rebound pain, no organomegaly, BS present Ext: No edema bilaterally. 2+DP/PT pulse bilaterally Musculoskeletal: Right lower extremity is shortened and externally rotated, very tender, patient is unable to move the hip without significant pain.  kin: No rashes.  Neuro: Alert and oriented X3, cranial nerves II-XII grossly intact, muscle strength 5/5 in all extremeties, sensation to light touch intact.  Psych: Patient is not psychotic, no suicidal or hemocidal ideation.  Labs on Admission:  Basic Metabolic Panel:  Recent Labs Lab 06/26/14 0355  NA 139  K 3.8  CL 101  CO2 31  GLUCOSE 94  BUN 13  CREATININE 0.50  CALCIUM 9.2   Liver Function Tests:  Recent Labs Lab 06/26/14 0355  AST 25  ALT 16  ALKPHOS 42  BILITOT 0.5  PROT 6.7  ALBUMIN 4.1   No results for input(s): LIPASE, AMYLASE in the last 168 hours. No results for input(s): AMMONIA in the last 168 hours. CBC:  Recent Labs Lab 06/26/14 0355  WBC 8.1  NEUTROABS 4.8  HGB 12.4  HCT 38.8  MCV 108.1*  PLT 197   Cardiac Enzymes:  Recent Labs Lab 06/26/14 0355  TROPONINI <0.03    BNP (last 3 results) No results for input(s): BNP in the last 8760 hours.  ProBNP (last 3 results) No results for input(s): PROBNP in the last 8760 hours.  CBG: No results for input(s): GLUCAP in the last 168 hours.  Radiological Exams on Admission: Dg Chest 1 View  06/26/2014   CLINICAL DATA:  Preoperative chest radiograph for hip fracture. Initial encounter.  EXAM: CHEST  1 VIEW  COMPARISON:  Chest radiograph from 01/21/2013  FINDINGS: The lungs are well-aerated and clear. There is no evidence of focal opacification, pleural effusion or pneumothorax.  The cardiomediastinal silhouette is within normal limits. No acute osseous abnormalities are seen. Mild degenerative change is noted at the  lower lumbar spine. The patient is status post vertebroplasty of the upper and mid to lower thoracic spine.  IMPRESSION: No acute cardiopulmonary process seen; no displaced rib fractures identified.   Electronically Signed   By: Garald Balding M.D.   On: 06/26/2014 04:39   Ct Head Wo Contrast  06/26/2014   CLINICAL DATA:  Found down in bathroom besides toilet. No loss of consciousness.  EXAM: CT HEAD WITHOUT CONTRAST  TECHNIQUE: Contiguous axial images were obtained from the base of the skull through the vertex without intravenous contrast.  COMPARISON:  CT of the head Sep 16, 2009  FINDINGS: The ventricles and sulci are normal for age. No intraparenchymal hemorrhage, mass effect nor midline shift. Patchy supratentorial white matter hypodensities are within normal range for patient's age and though non-specific suggest sequelae of chronic small vessel ischemic disease. No acute large vascular territory infarcts.  No abnormal extra-axial fluid collections. Basal cisterns are patent. Moderate calcific atherosclerosis of the carotid siphons.  No skull fracture. Patient is osteopenic. The included ocular globes and orbital contents are non-suspicious. 2 mm calcification inferior ocular globe, new punctate callus low location along RIGHT scleral, recommend correlation with ophthalmological examination. The mastoid aircells and included paranasal sinuses are well-aerated.  IMPRESSION: No acute intracranial process.  Involutional changes. Mild to moderate white matter changes can be seen with chronic small vessel ischemic disease.   Electronically Signed   By: Elon Alas   On: 06/26/2014 04:58   Dg Hip Unilat With Pelvis 2-3 Views Right  06/26/2014   CLINICAL DATA:  Status post fall; right hip deformity. Initial encounter.  EXAM: RIGHT HIP (WITH PELVIS) 2-3 VIEWS  COMPARISON:  None.  FINDINGS: There is a comminuted and displaced intertrochanteric fracture of the proximal right femur, with significant shortening  at the fracture site, and marked angulation of a greater trochanteric fragment. The right femoral head remains seated at the acetabulum.  No additional fractures are seen. There is chronic deformity involving the left superior and inferior pubic rami. The left hip joint is grossly unremarkable. Mild degenerative change is noted at the lower lumbar spine. The sacroiliac  joints are grossly unremarkable in appearance.  The visualized bowel gas pattern is grossly unremarkable.  IMPRESSION: Comminuted and displaced intertrochanteric fracture of the proximal right femur, with significant shortening at the fracture site, and marked angulation of a greater trochanteric fragment.   Electronically Signed   By: Garald Balding M.D.   On: 06/26/2014 04:40    EKG: Independently reviewed.   Assessment/Plan Principal Problem:   Intertrochanteric fracture of right hip Active Problems:   HLD (hyperlipidemia)   ANXIETY DEPRESSION   Atrial fibrillation   Fall   Dementia   Hypertension   Diabetes mellitus without complication   LOW BACK PAIN, CHRONIC   Tobacco abuse   Alcohol abuse  Right Hip fracture: Patient is hemodynamically stable. She does not have neurovascular compromise.  - will admit to tele bed - Pain control: Dilaudid and Percocet - follow up ortho recs - NPO after MN - type and cross - INR/PTT  Afib: CHA2DS2-VASc Score is 5, need oral anticoagulation, but she has high risk for fall, therefore is not a good candidate for anticoagulation. Heart rate is well controlled. -Continue aspirin, verapamil and metoprolol  Hypertension: -Continue verapamil, metoprolol, ramipril  Diabetes mellitus: A1c was 5.9 on 11/28/13. Patient is on metformin at home. -Switched to sliding scale insulin  Hyperlipidemia: LDL was 37 at 11/28/13 -Continue pravastatin  Tobacco abuse: -patient refused nicotine patch  alcohol abuse: -CIWA protocol    DVT ppx: SQ Heparin        Code Status: DNR Family  Communication:    Yes, patient's       at bed side Disposition Plan: Admit to inpatient   Date of Service 06/26/2014    Ivor Costa [facility] Pager [phone]  If 7PM-7AM, please contact night-coverage [email or web address] Password [facility] 06/26/2014, 7:03 AM

## 2014-06-26 NOTE — Anesthesia Preprocedure Evaluation (Signed)
Anesthesia Evaluation  Patient identified by MRN, date of birth, ID band Patient awake    Reviewed: Allergy & Precautions, NPO status , Patient's Chart, lab work & pertinent test results  Airway Mallampati: II  TM Distance: >3 FB Neck ROM: Full    Dental no notable dental hx.    Pulmonary COPDCurrent Smoker,  breath sounds clear to auscultation  Pulmonary exam normal       Cardiovascular Exercise Tolerance: Good hypertension, Pt. on medications and Pt. on home beta blockers + Past MI + dysrhythmias Atrial Fibrillation Rhythm:Regular Rate:Normal     Neuro/Psych PSYCHIATRIC DISORDERS Depression negative neurological ROS     GI/Hepatic negative GI ROS, Neg liver ROS,   Endo/Other  diabetes, Type 2, Oral Hypoglycemic Agents  Renal/GU negative Renal ROS  negative genitourinary   Musculoskeletal  (+) Arthritis -,   Abdominal   Peds negative pediatric ROS (+)  Hematology  (+) anemia ,   Anesthesia Other Findings   Reproductive/Obstetrics negative OB ROS                             Anesthesia Physical Anesthesia Plan  ASA: III  Anesthesia Plan: General   Post-op Pain Management:    Induction: Intravenous  Airway Management Planned: Oral ETT  Additional Equipment:   Intra-op Plan:   Post-operative Plan: Extubation in OR  Informed Consent: I have reviewed the patients History and Physical, chart, labs and discussed the procedure including the risks, benefits and alternatives for the proposed anesthesia with the patient or authorized representative who has indicated his/her understanding and acceptance.   Dental advisory given  Plan Discussed with: CRNA  Anesthesia Plan Comments:         Anesthesia Quick Evaluation

## 2014-06-26 NOTE — Progress Notes (Signed)
Clinical Social Work Department BRIEF PSYCHOSOCIAL ASSESSMENT 06/26/2014  Patient:  [name]     Account Number:  [id number]     Admit date:  06/26/2014  Clinical Social Worker:  Lacie Scotts  Date/Time:  06/26/2014 02:53 PM  Referred by:  CSW  Date Referred:  06/26/2014 Referred for  SNF Placement   Other Referral:   Interview type:  Patient Other interview type:    PSYCHOSOCIAL DATA Living Status:  HUSBAND Admitted from facility:   Level of care:   Primary support name:  [name] Primary support relationship to patient:   Degree of support available:   supportive    CURRENT CONCERNS  Other Concerns:    SOCIAL WORK ASSESSMENT / PLAN Pt is a 81 yr old female living at home prior to hospitalization. CSW met with pt and spoke with spouse on cell. Pt fell at home and fx her hip. She will have surgery this evening. Pt has been to [facility] multiple times and spouse is requesting placement there for ST Rehab following hospital d/c. CSW has contacted SNF and a decision is pending. Pt has [employer] which requires prior authorization. CSW will assist with this process.   Assessment/plan status:  Psychosocial Support/Ongoing Assessment of Needs Other assessment/ plan:   Information/referral to community resources:   Insurance coverage for SNF and ambulance auth reviewed.    PATIENT'S/FAMILY'S RESPONSE TO PLAN OF CARE: " I'm so hungry. I haven't eaten anything since last night. " Pt gently reminded that she can't eat prior to surgery and that once surgery is completed a meal will be provided. Pt is sore, and frustrated. Support provided. Spouse works during the day and feels once home from rehab he will need to arrange additional help at home. " I can't work outside the house and provide 24/7 care giving." Spouse is trying his best and feels overwhelmed with his responsibilities. Support provided.   Werner Lean LCSW [phone]

## 2014-06-26 NOTE — Transfer of Care (Signed)
Immediate Anesthesia Transfer of Care Note  Patient: [name]  Procedure(s) Performed: Procedure(s) (LRB): INTRAMEDULLARY (IM) NAIL INTERTROCHANTRIC (Right)  Patient Location: PACU  Anesthesia Type: General  Level of Consciousness: sedated, patient cooperative and responds to stimulation  Airway & Oxygen Therapy: Patient Spontanous Breathing and Patient connected to face mask oxgen  Post-op Assessment: Report given to PACU RN and Post -op Vital signs reviewed and stable  Post vital signs: Reviewed and stable  Complications: No apparent anesthesia complications

## 2014-06-26 NOTE — Progress Notes (Signed)
Clinical Social Work Department CLINICAL SOCIAL WORK PLACEMENT NOTE 06/26/2014  Patient:  [name], [name]  Account Number:  [id number] Admit date:  06/26/2014  Clinical Social Worker:  Werner Lean, LCSW  Date/time:  06/26/2014 03:11 PM  Clinical Social Work is seeking post-discharge placement for this patient at the following level of care:   SKILLED NURSING   (*CSW will update this form in Epic as items are completed)     Patient/family provided with [place] Department of Clinical Social Work's list of facilities offering this level of care within the geographic area requested by the patient (or if unable, by the patient's family).  06/26/2014  Patient/family informed of their freedom to choose among providers that offer the needed level of care, that participate in Medicare, Medicaid or managed care program needed by the patient, have an available bed and are willing to accept the patient.    Patient/family informed of [facility]' ownership interest in [facility], as well as of the fact that they are under no obligation to receive care at this facility.  PASARR submitted to EDS on 06/26/2014 PASARR number received on 06/26/2014  FL2 transmitted to all facilities in geographic area requested by pt/family on  06/26/2014 FL2 transmitted to all facilities within larger geographic area on   Patient informed that his/her managed care company has contracts with or will negotiate with  certain facilities, including the following:     Patient/family informed of bed offers received:   Patient chooses bed at  Physician recommends and patient chooses bed at    Patient to be transferred to  on   Patient to be transferred to facility by  Patient and family notified of transfer on  Name of family member notified:    The following physician request were entered in Epic:   Additional Comments:   Werner Lean LCSW [phone]

## 2014-06-26 NOTE — Brief Op Note (Signed)
06/26/2014  6:47 PM  PATIENT:  [name]  81 y.o. female  PRE-OPERATIVE DIAGNOSIS:  right intertrochanteric fracture  POST-OPERATIVE DIAGNOSIS:   right intertrochanteric fracture  PROCEDURE:  Procedure(s): INTRAMEDULLARY (IM) NAIL INTERTROCHANTRIC (Right), ORIF right hip fracture  SURGEON:  Surgeon(s) and Role:    * Mauri Pole, MD - Primary  PHYSICIAN ASSISTANT: None  ANESTHESIA:   general  EBL:  Total I/O In: 0  Out: 1200 [Urine:1200]  BLOOD ADMINISTERED:none  DRAINS: none   LOCAL MEDICATIONS USED:  NONE  SPECIMEN:  No Specimen  DISPOSITION OF SPECIMEN:  N/A  COUNTS:  YES  TOURNIQUET:  * No tourniquets in log *  DICTATION: .Other Dictation: Dictation Number [id number]  PLAN OF CARE: Admit to inpatient   PATIENT DISPOSITION:  PACU - hemodynamically stable.   Delay start of Pharmacological VTE agent (>24hrs) due to surgical blood loss or risk of bleeding: no

## 2014-06-26 NOTE — Progress Notes (Signed)
Patient seen and examined this morning, admitted this morning, agree with plan per H&P. She is s/p mechanical fall last night with hip fx. Orthopedic surgery to see.  - her A fib is rate controlled, she denies any chest pain/palpitations. Was on Coumadin but this was discontinued due to recurrent falls.  - HTN - DM - HLD - tobacco/alcohol abuse  Costin M. Cruzita Lederer, MD [facility] [phone]

## 2014-06-26 NOTE — ED Notes (Signed)
Bed: WA04 Expected date:  Expected time:  Means of arrival:  Comments: EMS 81yo Fall, hip pain

## 2014-06-26 NOTE — Progress Notes (Signed)
PT Cancellation Note  Patient Details Name: [name] MRN: [id number] DOB: 1932-08-15   Cancelled Treatment:     Pt admitted with hip fx.  PT deferred pending orthopedic consult.  Please re-order following.   Fields Oros 06/26/2014, 7:49 AM

## 2014-06-26 NOTE — Interval H&P Note (Signed)
History and Physical Interval Note:  06/26/2014 6:47 PM  [name]  has presented today for surgery, with the diagnosis of right intertrochanteric fracture  The various methods of treatment have been discussed with the patient and family. After consideration of risks, benefits and other options for treatment, the patient has consented to  Procedure(s): INTRAMEDULLARY (IM) NAIL INTERTROCHANTRIC (Right) as a surgical intervention .  The patient's history has been reviewed, patient examined, no change in status, stable for surgery.  I have reviewed the patient's chart and labs.  Questions were answered to the patient's satisfaction.     Mauri Pole

## 2014-06-26 NOTE — Progress Notes (Signed)
INITIAL NUTRITION ASSESSMENT  DOCUMENTATION CODES Per approved criteria  -Severe malnutrition in the context of chronic illness  Pt meets criteria for severe MALNUTRITION in the context of chronic illness as evidenced by severe fat and muscle wasting.  INTERVENTION: - Once diet advanced, add Glucerna Shake po TID, each supplement provides 220 kcal and 10 grams of protein  NUTRITION DIAGNOSIS: Inadequate oral intake related to inability to eat as evidenced by NPO.   Goal: Pt to meet >/= 90% of their estimated nutrition needs   Monitor:  Weight trend, po intake, acceptance of supplements, labs  Reason for Assessment: Malnutrition Screening Tool  81 y.o. female  Admitting Dx: Intertrochanteric fracture of right hip  ASSESSMENT: 81 y.o. female with past medical history of hypertension, hyperlipidemia, diabetes mellitus, coronary artery disease, COPD, atrial fibrillation not on anticoagulant due to high risk of fall, who presents with right hip after fall.  - Pt currently NPO for surgery.  - Pt reports poor po intake for the past 2 years. Her weight has dropped from 133 lbs to 97 lbs. For the past six months her weight has been stable around 197 lbs.  - Pt scheduled for surgery late this afternoon.  - She agreed to try nutritional supplements once diet advanced.  - labs reviewed  Nutrition Focused Physical Exam:  Subcutaneous Fat:  Orbital Region: moderate to severe wasting Upper Arm Region: severe wasting Thoracic and Lumbar Region: n/a  Muscle:  Temple Region: severe wasting Clavicle Bone Region: severe wasting Clavicle and Acromion Bone Region: severe wasting Scapular Bone Region: n/a Dorsal Hand: severe wasting Patellar Region: severe wasting Anterior Thigh Region: severe wasting Posterior Calf Region: moderate to severe wasting  Edema: none  Height: Ht Readings from Last 1 Encounters:  06/26/14 5\' 5"  (1.651 m)    Weight: Wt Readings from Last 1 Encounters:   06/26/14 99 lb (44.906 kg)    Ideal Body Weight: 57 kg  % Ideal Body Weight: 79%  Wt Readings from Last 10 Encounters:  06/26/14 99 lb (44.906 kg)  03/05/14 103 lb (46.72 kg)  01/30/14 103 lb (46.72 kg)  01/21/14 103 lb (46.72 kg)  12/24/13 103 lb (46.72 kg)  12/18/13 103 lb (46.72 kg)  11/28/13 100 lb 12.8 oz (45.723 kg)  05/24/13 105 lb (47.628 kg)  02/08/13 104 lb 12.8 oz (47.537 kg)  02/01/13 102 lb 12.8 oz (46.63 kg)   BMI:  Body mass index is 16.47 kg/(m^2).  Estimated Nutritional Needs: Kcal: 1300-1500 Protein: 60-70 g Fluid: 1.5 L/day  Skin: intact  Diet Order: Diet NPO time specified  EDUCATION NEEDS: -Education needs addressed   Intake/Output Summary (Last 24 hours) at 06/26/14 1443 Last data filed at 06/26/14 1414  Gross per 24 hour  Intake      0 ml  Output    200 ml  Net   -200 ml    Last BM: prior to admission   Labs:   Recent Labs Lab 06/26/14 0355  NA 139  K 3.8  CL 101  CO2 31  BUN 13  CREATININE 0.50  CALCIUM 9.2  GLUCOSE 94    CBG (last 3)   Recent Labs  06/26/14 0808 06/26/14 1143  GLUCAP 99 113*    Scheduled Meds: . sodium chloride   Intravenous STAT  . aspirin EC  81 mg Oral Daily  . B-complex with vitamin C  1 tablet Oral Daily  . cholecalciferol  2,000 Units Oral Daily  . fentaNYL  50 mcg Transdermal Q72H  .  fesoterodine  4 mg Oral Daily  . folic acid  1 mg Oral Daily  . heparin  5,000 Units Subcutaneous 3 times per day  . insulin aspart  0-9 Units Subcutaneous TID WC  . LORazepam  0-4 mg Intravenous Q6H   Followed by  . [START ON 06/28/2014] LORazepam  0-4 mg Intravenous Q12H  . memantine  14 mg Oral Daily  . metoprolol tartrate  12.5 mg Oral BID  . multivitamin with minerals  1 tablet Oral Daily  . multivitamin with minerals  1 tablet Oral Daily  . PARoxetine  40 mg Oral Daily  . pravastatin  80 mg Oral Daily  . ramipril  5 mg Oral Daily  . sodium chloride  3 mL Intravenous Q12H  . thiamine  100 mg  Oral Daily   Or  . thiamine  100 mg Intravenous Daily  . verapamil  180 mg Oral BID    Continuous Infusions:   Past Medical History  Diagnosis Date  . HTN (hypertension)   . Hyperlipidemia   . Diabetes mellitus, type 2   . Paroxysmal atrial fibrillation     chronic anticoag until 01/2013 - stopped due to high fall risk  . Right bundle branch block and incomplete left bundle branch block     with left axis deviation  . Chronic back pain     narcotic dependence  . Lumbar disc disease     dr. Trenton Gammon  . Mitral valve prolapse   . Macular degeneration   . Gastritis     RUT negative, 2005  . Diverticulosis   . Alcohol abuse   . Compression fracture 02/2011    s/p KP  . Arthritis   . Depression   . Myocardial infarction   . COPD (chronic obstructive pulmonary disease)     Past Surgical History  Procedure Laterality Date  . Right ankle surgery    . Knee arthroscopy      Laurette Schimke MS, RD, LDN

## 2014-06-26 NOTE — ED Notes (Signed)
Pt presents via EMS from home with c/o fall and right hip pain. Pt's spouse found pt laying beside the toilet but she was sitting on the toilet when EMS arrived. Pt is alert and oriented per EMS, c/o right hip pain, some inward rotation, not able to move it for EMS. Pt has no new neck or back pain, has hx of chronic neck and back pain. No LOC. Pt has a 20g left wrist placed by EMS, 172mcg fentanyl given by EMS en route. BP 145/67.

## 2014-06-26 NOTE — Anesthesia Procedure Notes (Signed)
Procedure Name: Intubation Date/Time: 06/26/2014 6:53 PM Performed by: Maxwell Caul Pre-anesthesia Checklist: Patient identified, Emergency Drugs available, Suction available, Patient being monitored and Timeout performed Patient Re-evaluated:Patient Re-evaluated prior to inductionOxygen Delivery Method: Circle system utilized Preoxygenation: Pre-oxygenation with 100% oxygen Intubation Type: IV induction Ventilation: Mask ventilation without difficulty Laryngoscope Size: Mac and 3 Grade View: Grade I Tube type: Oral Tube size: 7.0 mm Number of attempts: 1 Placement Confirmation: ETT inserted through vocal cords under direct vision,  positive ETCO2 and breath sounds checked- equal and bilateral Secured at: 20 cm Tube secured with: Tape Dental Injury: Teeth and Oropharynx as per pre-operative assessment  Comments: Neck maintained in neutral position during induction/intubation.

## 2014-06-26 NOTE — Consult Note (Signed)
Reason for Consult:  Comminuted and displaced right intertrochanteric fracture Referring Physician:   ED Physician  [name] is an 81 y.o. female.  HPI:   Patient's husband reports that patient has recurrent and frequent fall at home. She refuses to use walker or cane. At about 2:30 AM, her husband heard calling from the patient in the bathroom. He went to check on her, and found that patient is sitting on the floor and was complaining of pain over right hip. Patient did not lose consciousness. No palpitation or weakness before the event.  In ED, patient was found to have right hip fracture by x-ray. CT-head is negative for acute abnormalities. Chest x-ray is negative. INR 1.01. No leukocytosis. Temperature normal. Electrolytes normal. Patient is admitted to inpatient for further evaluation and treatment. Dr. Charlann Boxer was consulted by ED.  Risks, benefits and expectations were discussed with the patient.  Risks including but not limited to the risk of anesthesia, blood clots, nerve damage, blood vessel damage, failure of the prosthesis, infection and up to and including death.  Patient understand the risks, benefits and expectations and wishes to proceed with surgery.     Past Medical History  Diagnosis Date  . HTN (hypertension)   . Hyperlipidemia   . Diabetes mellitus, type 2   . Paroxysmal atrial fibrillation     chronic anticoag until 01/2013 - stopped due to high fall risk  . Right bundle branch block and incomplete left bundle branch block     with left axis deviation  . Chronic back pain     narcotic dependence  . Lumbar disc disease     dr. Dutch Quint  . Mitral valve prolapse   . Macular degeneration   . Gastritis     RUT negative, 2005  . Diverticulosis   . Alcohol abuse   . Compression fracture 02/2011    s/p KP  . Arthritis   . Depression   . Myocardial infarction   . COPD (chronic obstructive pulmonary disease)     Past Surgical History  Procedure Laterality Date  . Right  ankle surgery    . Knee arthroscopy      Family History  Problem Relation Age of Onset  . Heart disease Mother   . Cancer Father     throat  . Atrial fibrillation Sister     Social History:  reports that she has been smoking Cigarettes.  She has a 32.5 pack-year smoking history. She has never used smokeless tobacco. She reports that she drinks alcohol. She reports that she does not use illicit drugs.  Allergies:  Allergies  Allergen Reactions  . Codeine Other (See Comments)    unknown  . Morphine And Related Other (See Comments)    Pt had severe respiratory depression and excessive sedation during a recent hospitalization and was told it was 2/2 morphine.      Results for orders placed or performed during the hospital encounter of 06/26/14 (from the past 48 hour(s))  CBC with Differential/Platelet     Status: Abnormal   Collection Time: 06/26/14  3:55 AM  Result Value Ref Range   WBC 8.1 4.0 - 10.5 K/uL   RBC 3.59 (L) 3.87 - 5.11 MIL/uL   Hemoglobin 12.4 12.0 - 15.0 g/dL   HCT 94.4 96.7 - 59.1 %   MCV 108.1 (H) 78.0 - 100.0 fL   MCH 34.5 (H) 26.0 - 34.0 pg   MCHC 32.0 30.0 - 36.0 g/dL   RDW 63.8 46.6 -  15.5 %   Platelets 197 150 - 400 K/uL   Neutrophils Relative % 60 43 - 77 %   Neutro Abs 4.8 1.7 - 7.7 K/uL   Lymphocytes Relative 27 12 - 46 %   Lymphs Abs 2.2 0.7 - 4.0 K/uL   Monocytes Relative 10 3 - 12 %   Monocytes Absolute 0.8 0.1 - 1.0 K/uL   Eosinophils Relative 3 0 - 5 %   Eosinophils Absolute 0.3 0.0 - 0.7 K/uL   Basophils Relative 0 0 - 1 %   Basophils Absolute 0.0 0.0 - 0.1 K/uL  Comprehensive metabolic panel     Status: Abnormal   Collection Time: 06/26/14  3:55 AM  Result Value Ref Range   Sodium 139 135 - 145 mmol/L   Potassium 3.8 3.5 - 5.1 mmol/L   Chloride 101 96 - 112 mmol/L   CO2 31 19 - 32 mmol/L   Glucose, Bld 94 70 - 99 mg/dL   BUN 13 6 - 23 mg/dL   Creatinine, Ser 0.50 0.50 - 1.10 mg/dL   Calcium 9.2 8.4 - 10.5 mg/dL   Total Protein  6.7 6.0 - 8.3 g/dL   Albumin 4.1 3.5 - 5.2 g/dL   AST 25 0 - 37 U/L   ALT 16 0 - 35 U/L   Alkaline Phosphatase 42 39 - 117 U/L   Total Bilirubin 0.5 0.3 - 1.2 mg/dL   GFR calc non Af Amer 90 (L) >90 mL/min   GFR calc Af Amer >90 >90 mL/min    Comment: (NOTE) The eGFR has been calculated using the CKD EPI equation. This calculation has not been validated in all clinical situations. eGFR's persistently <90 mL/min signify possible Chronic Kidney Disease.    Anion gap 7 5 - 15  Troponin I     Status: None   Collection Time: 06/26/14  3:55 AM  Result Value Ref Range   Troponin I <0.03 <0.031 ng/mL    Comment:        NO INDICATION OF MYOCARDIAL INJURY.   Protime-INR     Status: None   Collection Time: 06/26/14  3:55 AM  Result Value Ref Range   Prothrombin Time 13.4 11.6 - 15.2 seconds   INR 1.01 0.00 - 1.49  APTT     Status: None   Collection Time: 06/26/14  3:55 AM  Result Value Ref Range   aPTT 31 24 - 37 seconds  APTT     Status: None   Collection Time: 06/26/14  7:50 AM  Result Value Ref Range   aPTT 31 24 - 37 seconds  Glucose, capillary     Status: None   Collection Time: 06/26/14  8:08 AM  Result Value Ref Range   Glucose-Capillary 99 70 - 99 mg/dL   Comment 1 Notify RN     Dg Chest 1 View  06/26/2014   CLINICAL DATA:  Preoperative chest radiograph for hip fracture. Initial encounter.  EXAM: CHEST  1 VIEW  COMPARISON:  Chest radiograph from 01/21/2013  FINDINGS: The lungs are well-aerated and clear. There is no evidence of focal opacification, pleural effusion or pneumothorax.  The cardiomediastinal silhouette is within normal limits. No acute osseous abnormalities are seen. Mild degenerative change is noted at the lower lumbar spine. The patient is status post vertebroplasty of the upper and mid to lower thoracic spine.  IMPRESSION: No acute cardiopulmonary process seen; no displaced rib fractures identified.   Electronically Signed   By: Francoise Schaumann.D.  On:  06/26/2014 04:39   Ct Head Wo Contrast  06/26/2014   CLINICAL DATA:  Found down in bathroom besides toilet. No loss of consciousness.  EXAM: CT HEAD WITHOUT CONTRAST  TECHNIQUE: Contiguous axial images were obtained from the base of the skull through the vertex without intravenous contrast.  COMPARISON:  CT of the head Sep 16, 2009  FINDINGS: The ventricles and sulci are normal for age. No intraparenchymal hemorrhage, mass effect nor midline shift. Patchy supratentorial white matter hypodensities are within normal range for patient's age and though non-specific suggest sequelae of chronic small vessel ischemic disease. No acute large vascular territory infarcts.  No abnormal extra-axial fluid collections. Basal cisterns are patent. Moderate calcific atherosclerosis of the carotid siphons.  No skull fracture. Patient is osteopenic. The included ocular globes and orbital contents are non-suspicious. 2 mm calcification inferior ocular globe, new punctate callus low location along RIGHT scleral, recommend correlation with ophthalmological examination. The mastoid aircells and included paranasal sinuses are well-aerated.  IMPRESSION: No acute intracranial process.  Involutional changes. Mild to moderate white matter changes can be seen with chronic small vessel ischemic disease.   Electronically Signed   By: Elon Alas   On: 06/26/2014 04:58   Dg Hip Unilat With Pelvis 2-3 Views Right  06/26/2014   CLINICAL DATA:  Status post fall; right hip deformity. Initial encounter.  EXAM: RIGHT HIP (WITH PELVIS) 2-3 VIEWS  COMPARISON:  None.  FINDINGS: There is a comminuted and displaced intertrochanteric fracture of the proximal right femur, with significant shortening at the fracture site, and marked angulation of a greater trochanteric fragment. The right femoral head remains seated at the acetabulum.  No additional fractures are seen. There is chronic deformity involving the left superior and inferior pubic rami.  The left hip joint is grossly unremarkable. Mild degenerative change is noted at the lower lumbar spine. The sacroiliac joints are grossly unremarkable in appearance.  The visualized bowel gas pattern is grossly unremarkable.  IMPRESSION: Comminuted and displaced intertrochanteric fracture of the proximal right femur, with significant shortening at the fracture site, and marked angulation of a greater trochanteric fragment.   Electronically Signed   By: Garald Balding M.D.   On: 06/26/2014 04:40    Review of Systems  Constitutional: Negative.   HENT: Negative.   Eyes: Negative.   Respiratory: Negative.   Cardiovascular: Negative.   Gastrointestinal: Negative.   Genitourinary: Negative.   Musculoskeletal: Positive for back pain and joint pain.  Skin: Negative.   Neurological: Negative.   Endo/Heme/Allergies: Negative.   Psychiatric/Behavioral: Positive for depression.      Blood pressure 142/59, pulse 66, temperature 98.4 F (36.9 C), temperature source Oral, resp. rate 16, height $RemoveBe'5\' 5"'wbslyDadl$  (1.651 m), weight 44.906 kg (99 lb), SpO2 97 %. Physical Exam  Constitutional: She is oriented to person, place, and time. She appears well-developed and well-nourished.  HENT:  Head: Normocephalic and atraumatic.  Eyes: Pupils are equal, round, and reactive to light.  Neck: Neck supple. No JVD present. No tracheal deviation present. No thyromegaly present.  Cardiovascular: Normal rate, regular rhythm and intact distal pulses.   Murmur heard. Respiratory: Effort normal and breath sounds normal. No stridor. No respiratory distress. She has no wheezes.  GI: Soft. There is no tenderness. There is no guarding.  Musculoskeletal:       Right hip: She exhibits decreased range of motion, decreased strength, tenderness, bony tenderness, swelling and deformity. She exhibits no laceration.  Lymphadenopathy:    She has no  cervical adenopathy.  Neurological: She is alert and oriented to person, place, and time.   Skin: Skin is warm and dry.  Psychiatric: She has a normal mood and affect.    Assessment/Plan: Comminuted and displaced right intertrochanteric fracture   NPO now Plan for ORIF of the right intertrochanteric fracture Will place order for consent Surgery possibly later today, will advise if plans change    Pricilla Loveless 06/26/2014, 8:28 AM

## 2014-06-26 NOTE — H&P (View-Only) (Signed)
Reason for Consult:  Comminuted and displaced right intertrochanteric fracture Referring Physician:   ED Physician  [name] is an 81 y.o. female.  HPI:   Patient's husband reports that patient has recurrent and frequent fall at home. She refuses to use walker or cane. At about 2:30 AM, her husband heard calling from the patient in the bathroom. He went to check on her, and found that patient is sitting on the floor and was complaining of pain over right hip. Patient did not lose consciousness. No palpitation or weakness before the event.  In ED, patient was found to have right hip fracture by x-ray. CT-head is negative for acute abnormalities. Chest x-ray is negative. INR 1.01. No leukocytosis. Temperature normal. Electrolytes normal. Patient is admitted to inpatient for further evaluation and treatment. Dr. Alvan Dame was consulted by ED.  Risks, benefits and expectations were discussed with the patient.  Risks including but not limited to the risk of anesthesia, blood clots, nerve damage, blood vessel damage, failure of the prosthesis, infection and up to and including death.  Patient understand the risks, benefits and expectations and wishes to proceed with surgery.     Past Medical History  Diagnosis Date  . HTN (hypertension)   . Hyperlipidemia   . Diabetes mellitus, type 2   . Paroxysmal atrial fibrillation     chronic anticoag until 01/2013 - stopped due to high fall risk  . Right bundle branch block and incomplete left bundle branch block     with left axis deviation  . Chronic back pain     narcotic dependence  . Lumbar disc disease     dr. Trenton Gammon  . Mitral valve prolapse   . Macular degeneration   . Gastritis     RUT negative, 2005  . Diverticulosis   . Alcohol abuse   . Compression fracture 02/2011    s/p KP  . Arthritis   . Depression   . Myocardial infarction   . COPD (chronic obstructive pulmonary disease)     Past Surgical History  Procedure Laterality Date  . Right  ankle surgery    . Knee arthroscopy      Family History  Problem Relation Age of Onset  . Heart disease Mother   . Cancer Father     throat  . Atrial fibrillation Sister     Social History:  reports that she has been smoking Cigarettes.  She has a 32.5 pack-year smoking history. She has never used smokeless tobacco. She reports that she drinks alcohol. She reports that she does not use illicit drugs.  Allergies:  Allergies  Allergen Reactions  . Codeine Other (See Comments)    unknown  . Morphine And Related Other (See Comments)    Pt had severe respiratory depression and excessive sedation during a recent hospitalization and was told it was 2/2 morphine.      Results for orders placed or performed during the hospital encounter of 06/26/14 (from the past 48 hour(s))  CBC with Differential/Platelet     Status: Abnormal   Collection Time: 06/26/14  3:55 AM  Result Value Ref Range   WBC 8.1 4.0 - 10.5 K/uL   RBC 3.59 (L) 3.87 - 5.11 MIL/uL   Hemoglobin 12.4 12.0 - 15.0 g/dL   HCT 38.8 36.0 - 46.0 %   MCV 108.1 (H) 78.0 - 100.0 fL   MCH 34.5 (H) 26.0 - 34.0 pg   MCHC 32.0 30.0 - 36.0 g/dL   RDW 12.4 11.5 -  15.5 %   Platelets 197 150 - 400 K/uL   Neutrophils Relative % 60 43 - 77 %   Neutro Abs 4.8 1.7 - 7.7 K/uL   Lymphocytes Relative 27 12 - 46 %   Lymphs Abs 2.2 0.7 - 4.0 K/uL   Monocytes Relative 10 3 - 12 %   Monocytes Absolute 0.8 0.1 - 1.0 K/uL   Eosinophils Relative 3 0 - 5 %   Eosinophils Absolute 0.3 0.0 - 0.7 K/uL   Basophils Relative 0 0 - 1 %   Basophils Absolute 0.0 0.0 - 0.1 K/uL  Comprehensive metabolic panel     Status: Abnormal   Collection Time: 06/26/14  3:55 AM  Result Value Ref Range   Sodium 139 135 - 145 mmol/L   Potassium 3.8 3.5 - 5.1 mmol/L   Chloride 101 96 - 112 mmol/L   CO2 31 19 - 32 mmol/L   Glucose, Bld 94 70 - 99 mg/dL   BUN 13 6 - 23 mg/dL   Creatinine, Ser 0.50 0.50 - 1.10 mg/dL   Calcium 9.2 8.4 - 10.5 mg/dL   Total Protein  6.7 6.0 - 8.3 g/dL   Albumin 4.1 3.5 - 5.2 g/dL   AST 25 0 - 37 U/L   ALT 16 0 - 35 U/L   Alkaline Phosphatase 42 39 - 117 U/L   Total Bilirubin 0.5 0.3 - 1.2 mg/dL   GFR calc non Af Amer 90 (L) >90 mL/min   GFR calc Af Amer >90 >90 mL/min    Comment: (NOTE) The eGFR has been calculated using the CKD EPI equation. This calculation has not been validated in all clinical situations. eGFR's persistently <90 mL/min signify possible Chronic Kidney Disease.    Anion gap 7 5 - 15  Troponin I     Status: None   Collection Time: 06/26/14  3:55 AM  Result Value Ref Range   Troponin I <0.03 <0.031 ng/mL    Comment:        NO INDICATION OF MYOCARDIAL INJURY.   Protime-INR     Status: None   Collection Time: 06/26/14  3:55 AM  Result Value Ref Range   Prothrombin Time 13.4 11.6 - 15.2 seconds   INR 1.01 0.00 - 1.49  APTT     Status: None   Collection Time: 06/26/14  3:55 AM  Result Value Ref Range   aPTT 31 24 - 37 seconds  APTT     Status: None   Collection Time: 06/26/14  7:50 AM  Result Value Ref Range   aPTT 31 24 - 37 seconds  Glucose, capillary     Status: None   Collection Time: 06/26/14  8:08 AM  Result Value Ref Range   Glucose-Capillary 99 70 - 99 mg/dL   Comment 1 Notify RN     Dg Chest 1 View  06/26/2014   CLINICAL DATA:  Preoperative chest radiograph for hip fracture. Initial encounter.  EXAM: CHEST  1 VIEW  COMPARISON:  Chest radiograph from 01/21/2013  FINDINGS: The lungs are well-aerated and clear. There is no evidence of focal opacification, pleural effusion or pneumothorax.  The cardiomediastinal silhouette is within normal limits. No acute osseous abnormalities are seen. Mild degenerative change is noted at the lower lumbar spine. The patient is status post vertebroplasty of the upper and mid to lower thoracic spine.  IMPRESSION: No acute cardiopulmonary process seen; no displaced rib fractures identified.   Electronically Signed   By: Francoise Schaumann.D.  On:  06/26/2014 04:39   Ct Head Wo Contrast  06/26/2014   CLINICAL DATA:  Found down in bathroom besides toilet. No loss of consciousness.  EXAM: CT HEAD WITHOUT CONTRAST  TECHNIQUE: Contiguous axial images were obtained from the base of the skull through the vertex without intravenous contrast.  COMPARISON:  CT of the head Sep 16, 2009  FINDINGS: The ventricles and sulci are normal for age. No intraparenchymal hemorrhage, mass effect nor midline shift. Patchy supratentorial white matter hypodensities are within normal range for patient's age and though non-specific suggest sequelae of chronic small vessel ischemic disease. No acute large vascular territory infarcts.  No abnormal extra-axial fluid collections. Basal cisterns are patent. Moderate calcific atherosclerosis of the carotid siphons.  No skull fracture. Patient is osteopenic. The included ocular globes and orbital contents are non-suspicious. 2 mm calcification inferior ocular globe, new punctate callus low location along RIGHT scleral, recommend correlation with ophthalmological examination. The mastoid aircells and included paranasal sinuses are well-aerated.  IMPRESSION: No acute intracranial process.  Involutional changes. Mild to moderate white matter changes can be seen with chronic small vessel ischemic disease.   Electronically Signed   By: Elon Alas   On: 06/26/2014 04:58   Dg Hip Unilat With Pelvis 2-3 Views Right  06/26/2014   CLINICAL DATA:  Status post fall; right hip deformity. Initial encounter.  EXAM: RIGHT HIP (WITH PELVIS) 2-3 VIEWS  COMPARISON:  None.  FINDINGS: There is a comminuted and displaced intertrochanteric fracture of the proximal right femur, with significant shortening at the fracture site, and marked angulation of a greater trochanteric fragment. The right femoral head remains seated at the acetabulum.  No additional fractures are seen. There is chronic deformity involving the left superior and inferior pubic rami.  The left hip joint is grossly unremarkable. Mild degenerative change is noted at the lower lumbar spine. The sacroiliac joints are grossly unremarkable in appearance.  The visualized bowel gas pattern is grossly unremarkable.  IMPRESSION: Comminuted and displaced intertrochanteric fracture of the proximal right femur, with significant shortening at the fracture site, and marked angulation of a greater trochanteric fragment.   Electronically Signed   By: Garald Balding M.D.   On: 06/26/2014 04:40    Review of Systems  Constitutional: Negative.   HENT: Negative.   Eyes: Negative.   Respiratory: Negative.   Cardiovascular: Negative.   Gastrointestinal: Negative.   Genitourinary: Negative.   Musculoskeletal: Positive for back pain and joint pain.  Skin: Negative.   Neurological: Negative.   Endo/Heme/Allergies: Negative.   Psychiatric/Behavioral: Positive for depression.      Blood pressure 142/59, pulse 66, temperature 98.4 F (36.9 C), temperature source Oral, resp. rate 16, height $RemoveBe'5\' 5"'fXNuAtjvM$  (1.651 m), weight 44.906 kg (99 lb), SpO2 97 %. Physical Exam  Constitutional: She is oriented to person, place, and time. She appears well-developed and well-nourished.  HENT:  Head: Normocephalic and atraumatic.  Eyes: Pupils are equal, round, and reactive to light.  Neck: Neck supple. No JVD present. No tracheal deviation present. No thyromegaly present.  Cardiovascular: Normal rate, regular rhythm and intact distal pulses.   Murmur heard. Respiratory: Effort normal and breath sounds normal. No stridor. No respiratory distress. She has no wheezes.  GI: Soft. There is no tenderness. There is no guarding.  Musculoskeletal:       Right hip: She exhibits decreased range of motion, decreased strength, tenderness, bony tenderness, swelling and deformity. She exhibits no laceration.  Lymphadenopathy:    She has no  cervical adenopathy.  Neurological: She is alert and oriented to person, place, and time.   Skin: Skin is warm and dry.  Psychiatric: She has a normal mood and affect.    Assessment/Plan: Comminuted and displaced right intertrochanteric fracture   NPO now Plan for ORIF of the right intertrochanteric fracture Will place order for consent Surgery possibly later today, will advise if plans change    Pricilla Loveless 06/26/2014, 8:28 AM

## 2014-06-26 NOTE — ED Provider Notes (Signed)
CSN: [id number]     Arrival date & time 06/26/14  3790 History   First MD Initiated Contact with Patient 06/26/14 [phone]     Chief Complaint  Patient presents with  . Fall  . Hip Pain     (Consider location/radiation/quality/duration/timing/severity/associated sxs/prior Treatment) HPI Patient presents with right hip pain after fall from standing. Patient states she got up to go to the restroom and fell. No loss of consciousness. Possible mild head injury. No focal weakness or numbness. Patient was unable to ambulate after the fall. Per EMS right lower extremity was shortened and externally rotated. Patient denies any focal weakness or numbness. Past Medical History  Diagnosis Date  . HTN (hypertension)   . Hyperlipidemia   . Diabetes mellitus, type 2   . Paroxysmal atrial fibrillation     chronic anticoag until 01/2013 - stopped due to high fall risk  . Right bundle branch block and incomplete left bundle branch block     with left axis deviation  . Chronic back pain     narcotic dependence  . Lumbar disc disease     dr. Trenton Gammon  . Mitral valve prolapse   . Macular degeneration   . Gastritis     RUT negative, 2005  . Diverticulosis   . Alcohol abuse   . Compression fracture 02/2011    s/p KP  . Arthritis   . Depression   . Myocardial infarction   . COPD (chronic obstructive pulmonary disease)    Past Surgical History  Procedure Laterality Date  . Right ankle surgery    . Knee arthroscopy     Family History  Problem Relation Age of Onset  . Heart disease Mother   . Cancer Father     throat  . Atrial fibrillation Sister    History  Substance Use Topics  . Smoking status: Current Every Day Smoker -- 0.50 packs/day for 65 years    Types: Cigarettes  . Smokeless tobacco: Never Used  . Alcohol Use: Yes     Comment: Occasional   OB History    No data available     Review of Systems  Constitutional: Negative for fever and chills.  Eyes: Negative for visual  disturbance.  Respiratory: Negative for shortness of breath.   Cardiovascular: Negative for chest pain.  Gastrointestinal: Negative for abdominal pain.  Musculoskeletal: Positive for myalgias and arthralgias. Negative for neck pain and neck stiffness.  Skin: Negative for rash and wound.  Neurological: Negative for dizziness, weakness, light-headedness, numbness and headaches.  All other systems reviewed and are negative.     Allergies  Codeine and Morphine and related  Home Medications   Prior to Admission medications   Medication Sig Start Date End Date Taking? Authorizing Provider  aspirin EC 81 MG tablet Take 1 tablet (81 mg total) by mouth daily. 02/08/13  Yes Rowe Clack, MD  b complex vitamins tablet Take 1 tablet by mouth daily.   Yes Historical Provider, MD  Cholecalciferol (VITAMIN D) 1000 UNITS capsule Take 2,000 Units by mouth daily.    Yes Historical Provider, MD  diazepam (VALIUM) 5 MG tablet TAKE 1 TABLET BY MOUTH EVERY 6 HOURS AS NEEDED FOR ANXIETY 02/24/14  Yes Rowe Clack, MD  fentaNYL (DURAGESIC - DOSED MCG/HR) 50 MCG/HR Place 1 patch (50 mcg total) onto the skin every 3 (three) days. 05/05/14  Yes Rowe Clack, MD  metFORMIN (GLUCOPHAGE) 500 MG tablet Take 1 tablet (500 mg total) by mouth daily with  breakfast. 01/08/14  Yes Rowe Clack, MD  metoprolol tartrate (LOPRESSOR) 25 MG tablet TAKE 1/2 TABLET (12.5MG )  BY MOUTH TWICE DAILY 04/04/14  Yes Deboraha Sprang, MD  Multiple Vitamin (MULTIVITAMIN WITH MINERALS) TABS tablet Take 1 tablet by mouth daily. 01/25/13  Yes Estela Leonie Green, MD  NAMENDA XR 14 MG CP24 TAKE 1 CAPSULE BY MOUTH DAILY.   Yes Rowe Clack, MD  oxyCODONE-acetaminophen (PERCOCET) 10-325 MG per tablet Take 1 tablet by mouth every 6 (six) hours as needed for pain. Last refill indicated. 05/06/14  Yes Rowe Clack, MD  PARoxetine (PAXIL) 40 MG tablet TAKE 1 TABLET (40 MG TOTAL) BY MOUTH EVERY MORNING. 12/10/13   Yes Rowe Clack, MD  polyethylene glycol powder (GLYCOLAX/MIRALAX) powder Take 17 g by mouth daily. 02/08/13  Yes Rowe Clack, MD  pravastatin (PRAVACHOL) 80 MG tablet TAKE ONE TABLET BY MOUTH ONCE A DAY 01/07/14  Yes Deboraha Sprang, MD  ramipril (ALTACE) 5 MG capsule Take 1 capsule (5 mg total) by mouth daily. 11/18/13  Yes Biagio Borg, MD  temazepam (RESTORIL) 30 MG capsule TAKE 1 CAPSULE(S) BY MOUTH AT BEDTIME AS NEEDED   Yes Rowe Clack, MD  tolterodine (DETROL LA) 4 MG 24 hr capsule TAKE ONE CAPSULE BY MOUTH DAILY 12/05/13  Yes Rowe Clack, MD  verapamil (CALAN-SR) 180 MG CR tablet TAKE ONE TABLET BY MOUTH TWICE DAILY 02/25/14  Yes Rowe Clack, MD   BP 142/59 mmHg  Pulse 66  Temp(Src) 98.4 F (36.9 C) (Oral)  Resp 16  Ht 5\' 5"  (1.651 m)  Wt 99 lb (44.906 kg)  BMI 16.47 kg/m2  SpO2 97% Physical Exam  Constitutional: She is oriented to person, place, and time. She appears well-developed and well-nourished. No distress.  HENT:  Head: Normocephalic and atraumatic.  Mouth/Throat: Oropharynx is clear and moist.  No obvious head trauma  Eyes: EOM are normal. Pupils are equal, round, and reactive to light.  Neck: Normal range of motion. Neck supple.  No posterior midline cervical tenderness to palpation.  Cardiovascular: Normal rate and regular rhythm.   Pulmonary/Chest: Effort normal and breath sounds normal. No respiratory distress. She has no wheezes. She has no rales. She exhibits no tenderness.  Abdominal: Soft. Bowel sounds are normal. She exhibits no distension and no mass. There is no tenderness. There is no rebound and no guarding.  Musculoskeletal: She exhibits no edema or tenderness.  Right lower extremity is shortened and externally rotated. Patient is unable to move the hip without significant pain. Patient has swelling and tenderness to her right knee. Small effusion appreciated. Distal pulses intact.  Neurological: She is alert and oriented to  person, place, and time.  Skin: Skin is warm and dry. No rash noted. No erythema.  Psychiatric: She has a normal mood and affect. Her behavior is normal.  Nursing note and vitals reviewed.   ED Course  Procedures (including critical care time) Labs Review Labs Reviewed  CBC WITH DIFFERENTIAL/PLATELET - Abnormal; Notable for the following:    RBC 3.59 (*)    MCV 108.1 (*)    MCH 34.5 (*)    All other components within normal limits  COMPREHENSIVE METABOLIC PANEL - Abnormal; Notable for the following:    GFR calc non Af Amer 90 (*)    All other components within normal limits  TROPONIN I  PROTIME-INR  APTT  URINALYSIS, ROUTINE W REFLEX MICROSCOPIC  APTT  TYPE AND SCREEN  Imaging Review Dg Chest 1 View  06/26/2014   CLINICAL DATA:  Preoperative chest radiograph for hip fracture. Initial encounter.  EXAM: CHEST  1 VIEW  COMPARISON:  Chest radiograph from 01/21/2013  FINDINGS: The lungs are well-aerated and clear. There is no evidence of focal opacification, pleural effusion or pneumothorax.  The cardiomediastinal silhouette is within normal limits. No acute osseous abnormalities are seen. Mild degenerative change is noted at the lower lumbar spine. The patient is status post vertebroplasty of the upper and mid to lower thoracic spine.  IMPRESSION: No acute cardiopulmonary process seen; no displaced rib fractures identified.   Electronically Signed   By: Garald Balding M.D.   On: 06/26/2014 04:39   Ct Head Wo Contrast  06/26/2014   CLINICAL DATA:  Found down in bathroom besides toilet. No loss of consciousness.  EXAM: CT HEAD WITHOUT CONTRAST  TECHNIQUE: Contiguous axial images were obtained from the base of the skull through the vertex without intravenous contrast.  COMPARISON:  CT of the head Sep 16, 2009  FINDINGS: The ventricles and sulci are normal for age. No intraparenchymal hemorrhage, mass effect nor midline shift. Patchy supratentorial white matter hypodensities are within normal  range for patient's age and though non-specific suggest sequelae of chronic small vessel ischemic disease. No acute large vascular territory infarcts.  No abnormal extra-axial fluid collections. Basal cisterns are patent. Moderate calcific atherosclerosis of the carotid siphons.  No skull fracture. Patient is osteopenic. The included ocular globes and orbital contents are non-suspicious. 2 mm calcification inferior ocular globe, new punctate callus low location along RIGHT scleral, recommend correlation with ophthalmological examination. The mastoid aircells and included paranasal sinuses are well-aerated.  IMPRESSION: No acute intracranial process.  Involutional changes. Mild to moderate white matter changes can be seen with chronic small vessel ischemic disease.   Electronically Signed   By: Elon Alas   On: 06/26/2014 04:58   Dg Hip Unilat With Pelvis 2-3 Views Right  06/26/2014   CLINICAL DATA:  Status post fall; right hip deformity. Initial encounter.  EXAM: RIGHT HIP (WITH PELVIS) 2-3 VIEWS  COMPARISON:  None.  FINDINGS: There is a comminuted and displaced intertrochanteric fracture of the proximal right femur, with significant shortening at the fracture site, and marked angulation of a greater trochanteric fragment. The right femoral head remains seated at the acetabulum.  No additional fractures are seen. There is chronic deformity involving the left superior and inferior pubic rami. The left hip joint is grossly unremarkable. Mild degenerative change is noted at the lower lumbar spine. The sacroiliac joints are grossly unremarkable in appearance.  The visualized bowel gas pattern is grossly unremarkable.  IMPRESSION: Comminuted and displaced intertrochanteric fracture of the proximal right femur, with significant shortening at the fracture site, and marked angulation of a greater trochanteric fragment.   Electronically Signed   By: Garald Balding M.D.   On: 06/26/2014 04:40     EKG  Interpretation None      MDM   Final diagnoses:  Pre-op chest exam  Closed right hip fracture, initial encounter    Discussed with [facility] hospitalist. Will see patient in the emergency department and admit.    Julianne Rice, MD 06/26/14 [phone]

## 2014-06-26 NOTE — Anesthesia Postprocedure Evaluation (Signed)
  Anesthesia Post-op Note  Patient: [name]  Procedure(s) Performed: Procedure(s) (LRB): INTRAMEDULLARY (IM) NAIL INTERTROCHANTRIC (Right)  Patient Location: PACU  Anesthesia Type: General  Level of Consciousness: awake and alert   Airway and Oxygen Therapy: Patient Spontanous Breathing  Post-op Pain: mild  Post-op Assessment: Post-op Vital signs reviewed, Patient's Cardiovascular Status Stable, Respiratory Function Stable, Patent Airway and No signs of Nausea or vomiting  Last Vitals:  Filed Vitals:   06/26/14 2047  BP: 126/46  Pulse: 84  Temp: 37 C  Resp: 16    Post-op Vital Signs: stable   Complications: No apparent anesthesia complications

## 2014-06-27 ENCOUNTER — Encounter (HOSPITAL_COMMUNITY): Payer: Self-pay | Admitting: Orthopedic Surgery

## 2014-06-27 DIAGNOSIS — E43 Unspecified severe protein-calorie malnutrition: Secondary | ICD-10-CM

## 2014-06-27 LAB — BASIC METABOLIC PANEL
Anion gap: 10 (ref 5–15)
BUN: 7 mg/dL (ref 6–23)
CALCIUM: 8.3 mg/dL — AB (ref 8.4–10.5)
CO2: 29 mmol/L (ref 19–32)
Chloride: 98 mmol/L (ref 96–112)
Creatinine, Ser: 0.4 mg/dL — ABNORMAL LOW (ref 0.50–1.10)
GFR calc Af Amer: >90 mL/min (ref >90)
GLUCOSE: 104 mg/dL — AB (ref 70–99)
Potassium: 3.6 mmol/L (ref 3.5–5.1)
Sodium: 137 mmol/L (ref 135–145)

## 2014-06-27 LAB — GLUCOSE, CAPILLARY
GLUCOSE-CAPILLARY: 210 mg/dL — AB (ref 70–99)
GLUCOSE-CAPILLARY: 146 mg/dL — AB (ref 70–99)
Glucose-Capillary: 84 mg/dL (ref 70–99)
Glucose-Capillary: 101 mg/dL — ABNORMAL HIGH (ref 70–99)
Glucose-Capillary: 192 mg/dL — ABNORMAL HIGH (ref 70–99)

## 2014-06-27 LAB — CBC
HCT: 29.6 % — ABNORMAL LOW (ref 36.0–46.0)
HEMOGLOBIN: 9.8 g/dL — AB (ref 12.0–15.0)
MCH: 35.3 pg — ABNORMAL HIGH (ref 26.0–34.0)
MCHC: 33.1 g/dL (ref 30.0–36.0)
MCV: 106.5 fL — AB (ref 78.0–100.0)
PLATELETS: 171 10*3/uL (ref 150–400)
RBC: 2.78 MIL/uL — AB (ref 3.87–5.11)
RDW: 12.5 % (ref 11.5–15.5)
WBC: 8 10*3/uL (ref 4.0–10.5)

## 2014-06-27 MED ORDER — HYDROCODONE-ACETAMINOPHEN 5-325 MG PO TABS: 1.0000 | ORAL_TABLET | Freq: Four times a day (QID) | ORAL | Status: DC | PRN

## 2014-06-27 MED ORDER — ENOXAPARIN SODIUM 40 MG/0.4ML ~~LOC~~ SOLN: 40.0000 mg | SUBCUTANEOUS | Status: DC

## 2014-06-27 MED ORDER — ENOXAPARIN SODIUM 30 MG/0.3ML ~~LOC~~ SOLN
30.0000 mg | SUBCUTANEOUS | Status: DC
  Administered 2014-06-28 – 2014-06-30 (×3): 30 mg via SUBCUTANEOUS
  Filled 2014-06-27 (×4): qty 0.3

## 2014-06-27 NOTE — Evaluation (Addendum)
Occupational Therapy Evaluation Patient Details Name: [name] MRN: [id number] DOB: Jan 17, 1933 Today's Date: 06/27/2014    History of Present Illness This 81 year old female sustained a R intertrochanteric fx of her hip which is s/p IM nail.  She has macular degeneration and frequent falls  at home   Clinical Impression   Pt was admitted for the above.  She is normally mod I with adls but does have a h/o frequent falls.  She uses a cane because she feels a walker will trip her.  Used RW as pt is PWB.  Pt is very motivated and was limited by pain.  She currently needs max to total A x 2 for LB adls and max A x 2 for SPT.  Goals in acute focus on SPT to 3:1 and standing for ADLs with min A x2 for safety.      Follow Up Recommendations  SNF    Equipment Recommendations  3 in 1 bedside comode    Recommendations for Other Services       Precautions / Restrictions Precautions Precautions: Fall Restrictions Weight Bearing Restrictions: Yes      Mobility Bed Mobility Overal bed mobility: Needs Assistance Bed Mobility: Supine to Sit     Supine to sit: Max assist;+2 for physical assistance     General bed mobility comments: utilized pad to bring pt to EOB.  Supported RLE and assisted with trunk  Transfers Overall transfer level: Needs assistance Equipment used: Rolling walker (2 wheeled) Transfers: Sit to/from [facility] Sit to Stand: Max assist;+2 physical assistance Stand pivot transfers: Max assist;+2 physical assistance       General transfer comment: assist to power up  and steady    Balance                                            ADL Overall ADL's : Needs assistance/impaired     Grooming: Brushing hair;Sitting;Set up   Upper Body Bathing: Set up;Sitting   Lower Body Bathing: Maximal assistance;+2 for physical assistance;Sit to/from stand   Upper Body Dressing : Minimal assistance;Sitting   Lower Body Dressing:  Total assistance;+2 for physical assistance;Sit to/from stand   Toilet Transfer: Maximal assistance;+2 for physical assistance;RW (SPT to chair)             General ADL Comments: pt had a lot of pain with movement:  very willing to try.  chair brought close for pivot     Vision     Perception     Praxis      Pertinent Vitals/Pain Pain Assessment: 0-10 Pain Score: 10-Worst pain ever (with movement) Pain Location: R hip Pain Descriptors / Indicators: Aching;Sore Pain Intervention(s): Limited activity within patient's tolerance;Monitored during session;Repositioned     Hand Dominance     Extremity/Trunk Assessment Upper Extremity Assessment Upper Extremity Assessment: Generalized weakness (arthritic changes)       Cervical / Trunk Assessment Cervical / Trunk Assessment: Kyphotic   Communication Communication Communication: No difficulties   Cognition Arousal/Alertness: Awake/alert Behavior During Therapy: WFL for tasks assessed/performed Overall Cognitive Status:  (pt is HOH, extra time to process vs. hearing)                     General Comments       Exercises       Shoulder Instructions  Home Living Family/patient expects to be discharged to:: Skilled nursing facility                                 Additional Comments: lives with husband       Prior Functioning/Environment Level of Independence: Independent with assistive device(s)        Comments: pt used a cane prior to admission; mod I with ADLs    OT Diagnosis: Acute pain;Generalized weakness   OT Problem List: Decreased strength;Decreased activity tolerance;Impaired balance (sitting and/or standing);Impaired vision/perception;Decreased knowledge of use of DME or AE;Decreased knowledge of precautions;Pain   OT Treatment/Interventions: Self-care/ADL training;DME and/or AE instruction;Balance training;Visual/perceptual remediation/compensation;Patient/family  education;Therapeutic activities    OT Goals(Current goals can be found in the care plan section) Acute Rehab OT Goals Patient Stated Goal: decreased pain; back to being independent OT Goal Formulation: With patient Time For Goal Achievement: 07/04/14 Potential to Achieve Goals: Good ADL Goals Pt Will Transfer to Toilet: with +2 assist;bedside commode;stand pivot transfer;with min assist Additional ADL Goal #1: pt will go from sit to stand with min A x 2 and maintain static standing for 2 minutes with min A for adls  OT Frequency: Min 2X/week   Barriers to D/C:            Co-evaluation PT/OT/SLP Co-Evaluation/Treatment: Yes Reason for Co-Treatment: For patient/therapist safety PT goals addressed during session: Mobility/safety with mobility OT goals addressed during session: ADL's and self-care      End of Session    Activity Tolerance: Patient limited by pain Patient left: in chair;with call bell/phone within reach   Time: 1137-1213 OT Time Calculation (min): 36 min Charges:  OT General Charges $OT Visit: 1 Procedure OT Evaluation $Initial OT Evaluation Tier I: 1 Procedure G-Codes:    Maple Odaniel July 19, 2014, 12:37 PM Lesle Chris, OTR/L [phone] July 19, 2014

## 2014-06-27 NOTE — Progress Notes (Signed)
PROGRESS NOTE  [name] LEX:[id number] DOB: 04-Mar-1933 DOA: 06/26/2014 PCP: No primary care provider on file.  HPI: [name] is a 81 y.o. female with past medical history of hypertension, hyperlipidemia, diabetes mellitus, coronary artery disease, COPD, atrial fibrillation not on anticoagulant due to high risk of fall, who presents with right hip after fall.  Subjective / 24 H Interval events - She underwent right hip surgery last night, her pain is better  Assessment/Plan: Principal Problem:   Intertrochanteric fracture of right hip Active Problems:   HLD (hyperlipidemia)   ANXIETY DEPRESSION   Atrial fibrillation   Fall   Dementia   Hypertension   Diabetes mellitus without complication   LOW BACK PAIN, CHRONIC   Tobacco abuse   Alcohol abuse   Protein-calorie malnutrition, severe  Right hip fracture - for orthopedic surgery, she underwent intramedullary nail repair on 2/11  - PT consult  - Pain control  Anemia - expected postoperative anemia, monitor CBC, no need for transfusions  Atrial fibrillation - she is not a candidate for full anticoagulation, will defer to orthopedic surgery as far as prophylaxis for #1  Hypertension - continue medications  Diabetes mellitus - sliding scale  Hyperlipidemia - continue pravastatin  Tobacco abuse - refuses nicotine patch  Alcohol use - she has about 1 drink per day, never withdrew   Diet: Diet - low sodium heart healthy Diet regular Fluids: none  DVT Prophylaxis: Lovenox  Code Status: DNR Family Communication: none bedside  Disposition Plan: SNF when ready   Consultants:  Orthopedic surgery   Procedures:  ORIF right hip 2/11   Antibiotics  Anti-infectives    Start     Dose/Rate Route Frequency Ordered Stop   06/27/14 0200  ceFAZolin (ANCEF) IVPB 1 g/50 mL premix     1 g 100 mL/hr over 30 Minutes Intravenous Every 6 hours 06/26/14 2047 06/27/14 0817       Studies  Dg Chest 1 View  06/26/2014    CLINICAL DATA:  Preoperative chest radiograph for hip fracture. Initial encounter.  EXAM: CHEST  1 VIEW  COMPARISON:  Chest radiograph from 01/21/2013  FINDINGS: The lungs are well-aerated and clear. There is no evidence of focal opacification, pleural effusion or pneumothorax.  The cardiomediastinal silhouette is within normal limits. No acute osseous abnormalities are seen. Mild degenerative change is noted at the lower lumbar spine. The patient is status post vertebroplasty of the upper and mid to lower thoracic spine.  IMPRESSION: No acute cardiopulmonary process seen; no displaced rib fractures identified.   Electronically Signed   By: Garald Balding M.D.   On: 06/26/2014 04:39   Ct Head Wo Contrast  06/26/2014   CLINICAL DATA:  Found down in bathroom besides toilet. No loss of consciousness.  EXAM: CT HEAD WITHOUT CONTRAST  TECHNIQUE: Contiguous axial images were obtained from the base of the skull through the vertex without intravenous contrast.  COMPARISON:  CT of the head Sep 16, 2009  FINDINGS: The ventricles and sulci are normal for age. No intraparenchymal hemorrhage, mass effect nor midline shift. Patchy supratentorial white matter hypodensities are within normal range for patient's age and though non-specific suggest sequelae of chronic small vessel ischemic disease. No acute large vascular territory infarcts.  No abnormal extra-axial fluid collections. Basal cisterns are patent. Moderate calcific atherosclerosis of the carotid siphons.  No skull fracture. Patient is osteopenic. The included ocular globes and orbital contents are non-suspicious. 2 mm calcification inferior ocular globe, new punctate callus low location  along RIGHT scleral, recommend correlation with ophthalmological examination. The mastoid aircells and included paranasal sinuses are well-aerated.  IMPRESSION: No acute intracranial process.  Involutional changes. Mild to moderate white matter changes can be seen with chronic small  vessel ischemic disease.   Electronically Signed   By: Elon Alas   On: 06/26/2014 04:58   Dg C-arm 1-60 Min-no Report  06/26/2014   CLINICAL DATA: surgery   C-ARM 1-60 MINUTES  Fluoroscopy was utilized by the requesting physician.  No radiographic  interpretation.    Dg Hip Operative Unilat With Pelvis Right  06/26/2014   CLINICAL DATA:  ORIF right hip  EXAM: OPERATIVE RIGHT HIP 2 VIEWS  TECHNIQUE: Fluoroscopic spot image(s) were submitted for interpretation post-operatively.  COMPARISON:  None.  FLUOROSCOPY TIME:  1 minutes 10 seconds  FINDINGS: Two fluoroscopic spot images of the right hip were submitted status post gamma nail with dynamic hip screw fixation of a comminuted intertrochanteric right hip fracture.  Fracture fragments are in near anatomic alignment and position.  IMPRESSION: Intraoperative fluoroscopic spot images during ORIF of a right hip fracture, as above.   Electronically Signed   By: Julian Hy M.D.   On: 06/26/2014 21:05   Dg Hip Unilat With Pelvis 2-3 Views Right  06/26/2014   CLINICAL DATA:  Status post fall; right hip deformity. Initial encounter.  EXAM: RIGHT HIP (WITH PELVIS) 2-3 VIEWS  COMPARISON:  None.  FINDINGS: There is a comminuted and displaced intertrochanteric fracture of the proximal right femur, with significant shortening at the fracture site, and marked angulation of a greater trochanteric fragment. The right femoral head remains seated at the acetabulum.  No additional fractures are seen. There is chronic deformity involving the left superior and inferior pubic rami. The left hip joint is grossly unremarkable. Mild degenerative change is noted at the lower lumbar spine. The sacroiliac joints are grossly unremarkable in appearance.  The visualized bowel gas pattern is grossly unremarkable.  IMPRESSION: Comminuted and displaced intertrochanteric fracture of the proximal right femur, with significant shortening at the fracture site, and marked angulation  of a greater trochanteric fragment.   Electronically Signed   By: Garald Balding M.D.   On: 06/26/2014 04:40    Objective  Filed Vitals:   06/27/14 0938 06/27/14 1118 06/27/14 1357 06/27/14 1505  BP: 102/42  84/41 95/49  Pulse: 83 95 86   Temp: 99.3 F (37.4 C)  98.5 F (36.9 C)   TempSrc: Oral  Oral   Resp: 20  20   Height:      Weight:      SpO2: 95%  100%     Intake/Output Summary (Last 24 hours) at 06/27/14 1639 Last data filed at 06/27/14 1227  Gross per 24 hour  Intake   1890 ml  Output   2255 ml  Net   -365 ml   Filed Weights   06/26/14 0617  Weight: 44.906 kg (99 lb)    Exam:  General:  NAD  HEENT: no scleral icterus  Cardiovascular: RRR no MRG  Respiratory: CTA biL  Abdomen: soft, non tender  MSK/Extremities: no clubbing/cyanosis, right hip dressing intact without bleeding/drainage  Skin: no rashes  Neuro: non focal   Data Reviewed: Basic Metabolic Panel:  Recent Labs Lab 06/26/14 0355 06/27/14 0536  NA 139 137  K 3.8 3.6  CL 101 98  CO2 31 29  GLUCOSE 94 104*  BUN 13 7  CREATININE 0.50 0.40*  CALCIUM 9.2 8.3*   Liver Function  Tests:  Recent Labs Lab 06/26/14 0355  AST 25  ALT 16  ALKPHOS 42  BILITOT 0.5  PROT 6.7  ALBUMIN 4.1   CBC:  Recent Labs Lab 06/26/14 0355 06/27/14 0536  WBC 8.1 8.0  NEUTROABS 4.8  --   HGB 12.4 9.8*  HCT 38.8 29.6*  MCV 108.1* 106.5*  PLT 197 171   Cardiac Enzymes:  Recent Labs Lab 06/26/14 0355  TROPONINI <0.03   CBG:  Recent Labs Lab 06/26/14 1753 06/26/14 2006 06/26/14 2209 06/27/14 0713 06/27/14 1215  GLUCAP 99 84 96 101* 210*    Recent Results (from the past 240 hour(s))  Surgical pcr screen     Status: None   Collection Time: 06/26/14 12:07 PM  Result Value Ref Range Status   MRSA, PCR NEGATIVE NEGATIVE Final   Staphylococcus aureus NEGATIVE NEGATIVE Final    Comment:        The Xpert SA Assay (FDA approved for NASAL specimens in patients over 21 years of  age), is one component of a comprehensive surveillance program.  Test performance has been validated by [facility] for patients greater than or equal to 45 year old. It is not intended to diagnose infection nor to guide or monitor treatment.      Scheduled Meds: . aspirin EC  81 mg Oral Daily  . B-complex with vitamin C  1 tablet Oral Daily  . cholecalciferol  2,000 Units Oral Daily  . docusate sodium  100 mg Oral BID  . [START ON 06/28/2014] enoxaparin (LOVENOX) injection  30 mg Subcutaneous Q24H  . feeding supplement (GLUCERNA SHAKE)  237 mL Oral TID BM  . fentaNYL  50 mcg Transdermal Q72H  . fesoterodine  4 mg Oral Daily  . folic acid  1 mg Oral Daily  . insulin aspart  0-9 Units Subcutaneous TID WC  . LORazepam  0-4 mg Intravenous Q6H   Followed by  . [START ON 06/28/2014] LORazepam  0-4 mg Intravenous Q12H  . memantine  14 mg Oral Daily  . metoprolol tartrate  12.5 mg Oral BID  . multivitamin with minerals  1 tablet Oral Daily  . PARoxetine  40 mg Oral Daily  . pravastatin  80 mg Oral Daily  . ramipril  5 mg Oral Daily  . sodium chloride  3 mL Intravenous Q12H  . thiamine  100 mg Oral Daily   Or  . thiamine  100 mg Intravenous Daily  . verapamil  180 mg Oral BID   Continuous Infusions: . sodium chloride 50 mL/hr at 06/26/14 2106    Marzetta Board, MD [facility] Pager [phone]. If 7 PM - 7 AM, please contact night-coverage at [email or web address], password [facility] 06/27/2014, 4:39 PM  LOS: 1 day

## 2014-06-27 NOTE — Evaluation (Signed)
Physical Therapy Evaluation Patient Details Name: [name] MRN: [id number] DOB: Nov 23, 1932 Today's Date: 06/27/2014   History of Present Illness  This 81 year old female sustained a R intertrochanteric fx of her hip which is s/p IM nail.  She has macular degeneration and frequent falls  at home  Clinical Impression  Pt s/p R hip fx with IM nail repair presents with functional mobility limitations 2* decreased R LE strength/ROM, PWB, pain, and premorbid deconditioning limiting functional mobility.  Pt would greatly benefit from follow up rehab at SNF level to maximize IND and safety prior to return home.    Follow Up Recommendations SNF    Equipment Recommendations  None recommended by PT    Recommendations for Other Services OT consult     Precautions / Restrictions Precautions Precautions: Fall Restrictions Weight Bearing Restrictions: Yes RLE Weight Bearing: Partial weight bearing      Mobility  Bed Mobility Overal bed mobility: Needs Assistance Bed Mobility: Supine to Sit     Supine to sit: Max assist;+2 for physical assistance     General bed mobility comments: utilized pad to bring pt to EOB.  Supported RLE and assisted with trunk  Transfers Overall transfer level: Needs assistance Equipment used: Rolling walker (2 wheeled) Transfers: Sit to/from Stand Sit to Stand: Max assist;+2 physical assistance Stand pivot transfers: Max assist;+2 physical assistance       General transfer comment: assist to power up  and steady  Ambulation/Gait Ambulation/Gait assistance: +2 physical assistance;Max assist Ambulation Distance (Feet): 1 Feet Assistive device: Rolling walker (2 wheeled) Gait Pattern/deviations: Step-to pattern;Decreased step length - right;Decreased step length - left;Shuffle;Trunk flexed Gait velocity: decr   General Gait Details: cues for sequence, posture, UE WB and position from [employer]            Wheelchair Mobility    Modified  Rankin (Stroke Patients Only)       Balance                                             Pertinent Vitals/Pain Pain Assessment: 0-10 Pain Score: 10-Worst pain ever Pain Location: R hip Pain Descriptors / Indicators: Aching;Sore Pain Intervention(s): Limited activity within patient's tolerance;Monitored during session;Repositioned;Patient requesting pain meds-RN notified    Home Living Family/patient expects to be discharged to:: Skilled nursing facility Living Arrangements: Spouse/significant other               Additional Comments: lives with husband     Prior Function Level of Independence: Independent with assistive device(s)         Comments: pt used a cane prior to admission; mod I with ADLs     Hand Dominance        Extremity/Trunk Assessment   Upper Extremity Assessment: Generalized weakness           Lower Extremity Assessment: RLE deficits/detail;LLE deficits/detail RLE Deficits / Details: 2/5 strength with AAROM to 70 hip flex adn 15 abd LLE Deficits / Details: generalized weakness  Cervical / Trunk Assessment: Kyphotic  Communication   Communication: No difficulties  Cognition Arousal/Alertness: Awake/alert Behavior During Therapy: WFL for tasks assessed/performed Overall Cognitive Status: Within Functional Limits for tasks assessed                      General Comments      Exercises General Exercises -  Lower Extremity Ankle Circles/Pumps: AROM;Both;10 reps;Supine Heel Slides: AAROM;Right;10 reps;Supine Hip ABduction/ADduction: AAROM;Right;10 reps;Supine      Assessment/Plan    PT Assessment Patient needs continued PT services  PT Diagnosis Difficulty walking   PT Problem List Decreased strength;Decreased range of motion;Decreased activity tolerance;Decreased balance;Decreased mobility;Decreased knowledge of use of DME;Pain;Decreased knowledge of precautions  PT Treatment Interventions DME  instruction;Gait training;Functional mobility training;Therapeutic activities;Therapeutic exercise;Patient/family education   PT Goals (Current goals can be found in the Care Plan section) Acute Rehab PT Goals Patient Stated Goal: decreased pain; back to being independent PT Goal Formulation: With patient Time For Goal Achievement: 07/11/14 Potential to Achieve Goals: Good    Frequency Min 3X/week   Barriers to discharge        Co-evaluation PT/OT/SLP Co-Evaluation/Treatment: Yes Reason for Co-Treatment: For patient/therapist safety PT goals addressed during session: Mobility/safety with mobility OT goals addressed during session: ADL's and self-care       End of Session Equipment Utilized During Treatment: Gait belt Activity Tolerance: Patient limited by pain Patient left: in chair;with call bell/phone within reach Nurse Communication: Mobility status         Time: 8768-1157 PT Time Calculation (min) (ACUTE ONLY): 36 min   Charges:   PT Evaluation $Initial PT Evaluation Tier I: 1 Procedure     PT G Codes:        Caeli Linehan 11-Jul-2014, 12:54 PM

## 2014-06-27 NOTE — Progress Notes (Signed)
[employer] has provided authorization for placement at [facility] if pt is stable for d/c. Pt / spouse / SNF have been updated. Weekend CSW will assist with d/c planning to SNF as needed.  Werner Lean LCSW [phone]

## 2014-06-27 NOTE — Progress Notes (Signed)
Patient ID: [name], female   DOB: [birth year], 81 y.o.   MRN: [id number] Subjective: 1 Day Post-Op Procedure(s) (LRB): INTRAMEDULLARY (IM) NAIL INTERTROCHANTRIC (Right)    Patient reports pain as mild but sleepy and not much activity last night after her operation  Objective:   VITALS:   Filed Vitals:   06/27/14 0537  BP: 105/52  Pulse: 78  Temp: 98.8 F (37.1 C)  Resp: 18    Neurovascular intact Incision: dressing C/D/I  LABS  Recent Labs  06/26/14 0355 06/27/14 0536  HGB 12.4 9.8*  HCT 38.8 29.6*  WBC 8.1 8.0  PLT 197 171     Recent Labs  06/26/14 0355 06/27/14 0536  NA 139 137  K 3.8 3.6  BUN 13 7  CREATININE 0.50 0.40*  GLUCOSE 94 104*     Recent Labs  06/26/14 0355  INR 1.01     Assessment/Plan: 1 Day Post-Op Procedure(s) (LRB): INTRAMEDULLARY (IM) NAIL INTERTROCHANTRIC (Right)   Advance diet Up with therapy Discharge to SNF when possible  PWB RLE until further directed from office with healing followed radiographically

## 2014-06-27 NOTE — Progress Notes (Signed)
CSW assisting with d/c planning. Pt / spouse have accepted ST Rehab bed at [facility]. [employer] authorization has been requested for possible d/c this weekend. A decision from insurance is pending.  Werner Lean LCSW [phone]

## 2014-06-27 NOTE — Care Management Note (Signed)
    Page 1 of 1   06/27/2014     7:04:55 AM CARE MANAGEMENT NOTE 06/27/2014  Patient:  [employer]   Account Number:  [id number]  Date Initiated:  06/28/2014  Documentation initiated by:  [facility]  Subjective/Objective Assessment:   adm: INTRAMEDULLARY (IM) NAIL INTERTROCHANTRIC (Right)     Action/Plan:   discharge planning   Anticipated DC Date:  06/28/2014   Anticipated DC Plan:  [place]  CM consult      Choice offered to / List presented to:             Status of service:  Completed, signed off Medicare Important Message given?   (If response is "NO", the following Medicare IM given date fields will be blank) Date Medicare IM given:   Medicare IM given by:   Date Additional Medicare IM given:   Additional Medicare IM given by:    Discharge Disposition:  [place]  Per UR Regulation:    If discussed at Long Length of Stay Meetings, dates discussed:    Comments:  06/27/14 07:00 CM notes pt to go to SNF for rehab; CSW arranging.  No other CM needs were communicated.  Mariane Masters, BSn, CM [phone].

## 2014-06-27 NOTE — Op Note (Signed)
[name], [name]                 ACCOUNT NO.:  [id number]  MEDICAL RECORD NO.:  [id number]  LOCATION:  6010                         FACILITY:  [facility]  PHYSICIAN:  Pietro Cassis. Alvan Dame, M.D.  DATE OF BIRTH:  August 03, 1932  DATE OF PROCEDURE:  06/26/2014 DATE OF DISCHARGE:                              OPERATIVE REPORT   PREOPERATIVE DIAGNOSIS:  Comminuted right peritrochanteric femur fracture.  POSTOPERATIVE DIAGNOSIS:  Comminuted right peritrochanteric femur fracture.  PROCEDURE:  Open reduction and internal fixation of right peritrochanteric femur fracture utilizing a Biomet AFFIXUS nail 18 x 130 degrees.  SURGEON:  Pietro Cassis. Alvan Dame, M.D.  ASSISTANT:  Surgical team.  ANESTHESIA:  General.  SPECIMENS:  None.  COMPLICATIONS:  None.  BLOOD LOSS:  About 25 mL.  INDICATION:  Ms. Barcus is a 81 year old female who unfortunately had a ground-level fall.  She was brought to emergency room.  Radiographs revealed an intertrochanteric femur fracture pattern.  She was seen evaluated on the floor upon admission to the Hospitalist Service for hip fracture pathway.  I had a chance to review the indications of surgery and necessity for fracture management to help with pain relief and fracture healing and potential ambulatory status.  After reviewing this with her including risk, I discussed and reviewed infection, nonunion, need for future surgery.  Consent was obtained for benefit of fracture management.  PROCEDURE IN DETAIL:  The patient was brought to operative theater. Once adequate anesthesia, preoperative antibiotics including Ancef administered.  She was positioned supine on the fracture table.  The right extremity was positioned in the traction boot.  The left lower extremity was flexed and abducted away with bony prominences padded, particularly the perineal nerve area.  Padded perineal post was placed.  Once positioned, fluoroscopy was brought to the field and under traction and  internal rotation, the  fracture was reduced to the best nearly anatomic position, identifying this is more peritroch as opposed to direct subtroch fracture pattern.  After confirming that radiographs in AP and lateral planes, we then prepped and draped the right lateral hip with a shower curtain technique.  Time-out was performed identifying the patient, planned procedure, and extremity.  Fluoroscopy was brought back to the field and landmarks identified and incision was made proximal to the tip of the greater trochanter.  She is a very thin female.  Soft tissue dissection carried to the gluteal fascia was then split.  A guidewire was inserted into the tip of the trochanter and then passed into the proximal femoral shaft.  Once confirmed radiographically, I then used a starting drill on the proximal femur.  The 180 x 11 mm nail was opened.  I was able to pass it about 90% away, but it seemed to get hung up in the isthmus of the femoral shaft.  For this reason instead of impacting further and causing complication, I removed the nail over the ball-tip guidewire.  I then reamed with a 10, 11 and a 12 mm reamer and then passed the nail by hand to appropriate depth.  With the nail in appropriate position, the fracture was reduced to a nearly completely anatomically reduced pattern.  I placed a lag  screw in place and locked in position given the peritroch fracture pattern to prevent any complications from compression.  Based on the tightness in the isthmus, I chose not to place a distal interlock.  Final radiographs were obtained in AP and lateral planes.  I was very satisfied with the reduction at this point.  The hip wounds were irrigated.  The fascia was reapproximated using #1 Vicryl.  The remaining wound was closed with 2-0 Vicryl and staples on the skin.  The same with the area of the lag screw insertion site.  The wound was then cleaned, dried, and dressed sterilely using Mepilex  dressing.  She was awoke from anesthesia and brought to the recovery room in stable condition, tolerated the procedure well.  Findings were reviewed with family.     Pietro Cassis Alvan Dame, M.D.     MDO/MEDQ  D:  06/26/2014  T:  06/27/2014  Job:  [id number]

## 2014-06-27 NOTE — Progress Notes (Signed)
Physical Therapy Treatment Patient Details Name: [name] MRN: [id number] DOB: May 21, 1932 Today's Date: 06/27/2014    History of Present Illness This 81 year old female sustained a R intertrochanteric fx of her hip which is s/p IM nail.  She has macular degeneration and frequent falls  at home    PT Comments    Slow progress with pt ltd by pain and anxiety level  Follow Up Recommendations  SNF     Equipment Recommendations  None recommended by PT    Recommendations for Other Services OT consult     Precautions / Restrictions Precautions Precautions: Fall Restrictions Weight Bearing Restrictions: Yes RLE Weight Bearing: Partial weight bearing    Mobility  Bed Mobility Overal bed mobility: Needs Assistance Bed Mobility: Sit to Supine       Sit to supine: Max assist;+2 for physical assistance;+2 for safety/equipment   General bed mobility comments: assist for Bil LEs and to control trunk  Transfers Overall transfer level: Needs assistance Equipment used: Rolling walker (2 wheeled) Transfers: Sit to/from Stand Sit to Stand: Max assist;+2 physical assistance Stand pivot transfers: Max assist;+2 physical assistance       General transfer comment: assist to power up  and steady  Ambulation/Gait Ambulation/Gait assistance: Max assist;+2 physical assistance;+2 safety/equipment Ambulation Distance (Feet): 1 Feet Assistive device: Rolling walker (2 wheeled) Gait Pattern/deviations: Step-to pattern;Decreased step length - right;Decreased step length - left;Shuffle;Antalgic Gait velocity: decr   General Gait Details: cues for sequence, posture, UE WB and position from RW   Stairs            Wheelchair Mobility    Modified Rankin (Stroke Patients Only)       Balance                                    Cognition Arousal/Alertness: Awake/alert Behavior During Therapy: WFL for tasks assessed/performed Overall Cognitive Status: Within  Functional Limits for tasks assessed                      Exercises      General Comments        Pertinent Vitals/Pain Pain Assessment: 0-10 Pain Score: 10-Worst pain ever Pain Location: R hip and back Pain Descriptors / Indicators: Aching;Sore Pain Intervention(s): Limited activity within patient's tolerance;Monitored during session;Premedicated before session;Ice applied;Repositioned    Home Living                      Prior Function            PT Goals (current goals can now be found in the care plan section) Acute Rehab PT Goals Patient Stated Goal: decreased pain; back to being independent PT Goal Formulation: With patient Time For Goal Achievement: 07/11/14 Potential to Achieve Goals: Good Progress towards PT goals: Progressing toward goals    Frequency  Min 3X/week    PT Plan Current plan remains appropriate    Co-evaluation             End of Session Equipment Utilized During Treatment: Gait belt Activity Tolerance: Patient limited by pain Patient left: in bed;with call bell/phone within reach     Time: 1318-1336 PT Time Calculation (min) (ACUTE ONLY): 18 min  Charges:  $Therapeutic Activity: 8-22 mins                    G Codes:  [name] 06/27/2014, 4:00 PM

## 2014-06-28 DIAGNOSIS — F341 Dysthymic disorder: Secondary | ICD-10-CM

## 2014-06-28 DIAGNOSIS — D62 Acute posthemorrhagic anemia: Secondary | ICD-10-CM

## 2014-06-28 LAB — BASIC METABOLIC PANEL
Anion gap: 7 (ref 5–15)
BUN: 8 mg/dL (ref 6–23)
CO2: 29 mmol/L (ref 19–32)
CREATININE: 0.34 mg/dL — AB (ref 0.50–1.10)
Calcium: 8.3 mg/dL — ABNORMAL LOW (ref 8.4–10.5)
Chloride: 98 mmol/L (ref 96–112)
GFR calc Af Amer: >90 mL/min (ref >90)
Glucose, Bld: 128 mg/dL — ABNORMAL HIGH (ref 70–99)
Potassium: 3.8 mmol/L (ref 3.5–5.1)
Sodium: 134 mmol/L — ABNORMAL LOW (ref 135–145)

## 2014-06-28 LAB — CBC
HEMATOCRIT: 23.9 % — AB (ref 36.0–46.0)
Hemoglobin: 8.4 g/dL — ABNORMAL LOW (ref 12.0–15.0)
MCH: 36.7 pg — ABNORMAL HIGH (ref 26.0–34.0)
MCHC: 35.1 g/dL (ref 30.0–36.0)
MCV: 104.4 fL — ABNORMAL HIGH (ref 78.0–100.0)
PLATELETS: 126 10*3/uL — AB (ref 150–400)
RBC: 2.29 MIL/uL — ABNORMAL LOW (ref 3.87–5.11)
RDW: 12.3 % (ref 11.5–15.5)
WBC: 7.7 10*3/uL (ref 4.0–10.5)

## 2014-06-28 LAB — GLUCOSE, CAPILLARY
Glucose-Capillary: 113 mg/dL — ABNORMAL HIGH (ref 70–99)
Glucose-Capillary: 148 mg/dL — ABNORMAL HIGH (ref 70–99)
Glucose-Capillary: 270 mg/dL — ABNORMAL HIGH (ref 70–99)

## 2014-06-28 NOTE — Progress Notes (Signed)
Patient ID: [name], female   DOB: 1933/01/11, 81 y.o.   MRN: [id number] Subjective: 2 Days Post-Op Procedure(s) (LRB): INTRAMEDULLARY (IM) NAIL INTERTROCHANTRIC (Right)    Patient reports pain as mild with regards to hip.  Having more complaints related to her axial spine related pains (reported history of cervical and lumbar DDD)  Objective:   VITALS:   Filed Vitals:   06/28/14 0524  BP: 106/43  Pulse: 68  Temp: 99.1 F (37.3 C)  Resp: 18    Neurovascular intact Incision: dressing C/D/I  LABS  Recent Labs  06/26/14 0355 06/27/14 0536 06/28/14 0500  HGB 12.4 9.8* 8.4*  HCT 38.8 29.6* 23.9*  WBC 8.1 8.0 7.7  PLT 197 171 126*     Recent Labs  06/26/14 0355 06/27/14 0536 06/28/14 0500  NA 139 137 134*  K 3.8 3.6 3.8  BUN 13 7 8   CREATININE 0.50 0.40* 0.34*  GLUCOSE 94 104* 128*     Recent Labs  06/26/14 0355  INR 1.01     Assessment/Plan: 2 Days Post-Op Procedure(s) (LRB): INTRAMEDULLARY (IM) NAIL INTERTROCHANTRIC (Right)   Up with therapy Discharge to SNF when appropriate Will see Monday if still here  Follow up with Laneah Luft, [place] in 2 weeks PWB RLE

## 2014-06-28 NOTE — Progress Notes (Signed)
PROGRESS NOTE  [name] OQH:[id number] DOB: June 11, 1932 DOA: 06/26/2014 PCP: No primary care provider on file.  HPI: [name] is a 81 y.o. female with past medical history of hypertension, hyperlipidemia, diabetes mellitus, coronary artery disease, COPD, atrial fibrillation not on anticoagulant due to high risk of fall, who presents with right hip after fall.  Subjective / 24 H Interval events - Endorses feeling depressed this morning because she is in bed  Assessment/Plan: Principal Problem:   Intertrochanteric fracture of right hip Active Problems:   HLD (hyperlipidemia)   ANXIETY DEPRESSION   Atrial fibrillation   Fall   Dementia   Hypertension   Diabetes mellitus without complication   LOW BACK PAIN, CHRONIC   Tobacco abuse   Alcohol abuse   Protein-calorie malnutrition, severe  Right hip fracture - for orthopedic surgery, she underwent intramedullary nail repair on 2/11  - PT consult recommending SNF - Pain control  Anemia - expected postoperative anemia, monitor CBC, no need for transfusions - Hemoglobin continues to trend down, associated mild consumption thrombocytopenia, repeat CBC tomorrow morning  Atrial fibrillation - she is not a candidate for full anticoagulation, will defer to orthopedic surgery as far as prophylaxis for #1  Hypertension - continue medications  Diabetes mellitus - sliding scale  Hyperlipidemia - continue pravastatin  Tobacco abuse - refuses nicotine patch  Alcohol use - she has about 1 drink per day, never withdrew   Diet: Diet - low sodium heart healthy Diet regular Fluids: none  DVT Prophylaxis: Lovenox  Code Status: DNR Family Communication: none bedside  Disposition Plan: SNF when ready likely in 1-2 days,   Consultants:  Orthopedic surgery   Procedures:  ORIF right hip 2/11   Antibiotics  Anti-infectives    Start     Dose/Rate Route Frequency Ordered Stop   06/27/14 0200  ceFAZolin (ANCEF) IVPB 1 g/50 mL  premix     1 g 100 mL/hr over 30 Minutes Intravenous Every 6 hours 06/26/14 2047 06/27/14 0817       Studies  Dg C-arm 1-60 Min-no Report  06/26/2014   CLINICAL DATA: surgery   C-ARM 1-60 MINUTES  Fluoroscopy was utilized by the requesting physician.  No radiographic  interpretation.    Dg Hip Operative Unilat With Pelvis Right  06/26/2014   CLINICAL DATA:  ORIF right hip  EXAM: OPERATIVE RIGHT HIP 2 VIEWS  TECHNIQUE: Fluoroscopic spot image(s) were submitted for interpretation post-operatively.  COMPARISON:  None.  FLUOROSCOPY TIME:  1 minutes 10 seconds  FINDINGS: Two fluoroscopic spot images of the right hip were submitted status post gamma nail with dynamic hip screw fixation of a comminuted intertrochanteric right hip fracture.  Fracture fragments are in near anatomic alignment and position.  IMPRESSION: Intraoperative fluoroscopic spot images during ORIF of a right hip fracture, as above.   Electronically Signed   By: Julian Hy M.D.   On: 06/26/2014 21:05    Objective  Filed Vitals:   06/28/14 0800 06/28/14 1109 06/28/14 1200 06/28/14 1455  BP:  115/43  122/50  Pulse:  70  66  Temp:  98.3 F (36.8 C)  98.4 F (36.9 C)  TempSrc:  Oral  Oral  Resp: 18 16 16 16   Height:      Weight:      SpO2: 94% 95% 95% 95%    Intake/Output Summary (Last 24 hours) at 06/28/14 1536 Last data filed at 06/28/14 1447  Gross per 24 hour  Intake    750 ml  Output   1150 ml  Net   -400 ml   Filed Weights   06/26/14 0617  Weight: 44.906 kg (99 lb)    Exam:  General:  NAD  HEENT: no scleral icterus  Cardiovascular: RRR no MRG  Respiratory: CTA biL  Abdomen: soft, non tender  MSK/Extremities: no clubbing/cyanosis, right hip dressing intact without bleeding/drainage  Skin: no rashes  Neuro: non focal   Data Reviewed: Basic Metabolic Panel:  Recent Labs Lab 06/26/14 0355 06/27/14 0536 06/28/14 0500  NA 139 137 134*  K 3.8 3.6 3.8  CL 101 98 98  CO2 31 29 29     GLUCOSE 94 104* 128*  BUN 13 7 8   CREATININE 0.50 0.40* 0.34*  CALCIUM 9.2 8.3* 8.3*   Liver Function Tests:  Recent Labs Lab 06/26/14 0355  AST 25  ALT 16  ALKPHOS 42  BILITOT 0.5  PROT 6.7  ALBUMIN 4.1   CBC:  Recent Labs Lab 06/26/14 0355 06/27/14 0536 06/28/14 0500  WBC 8.1 8.0 7.7  NEUTROABS 4.8  --   --   HGB 12.4 9.8* 8.4*  HCT 38.8 29.6* 23.9*  MCV 108.1* 106.5* 104.4*  PLT 197 171 126*   Cardiac Enzymes:  Recent Labs Lab 06/26/14 0355  TROPONINI <0.03   CBG:  Recent Labs Lab 06/27/14 1215 06/27/14 1755 06/27/14 2015 06/28/14 0722 06/28/14 1223  GLUCAP 210* 192* 146* 113* 148*    Recent Results (from the past 240 hour(s))  Surgical pcr screen     Status: None   Collection Time: 06/26/14 12:07 PM  Result Value Ref Range Status   MRSA, PCR NEGATIVE NEGATIVE Final   Staphylococcus aureus NEGATIVE NEGATIVE Final    Comment:        The Xpert SA Assay (FDA approved for NASAL specimens in patients over 9 years of age), is one component of a comprehensive surveillance program.  Test performance has been validated by [facility] for patients greater than or equal to 4 year old. It is not intended to diagnose infection nor to guide or monitor treatment.      Scheduled Meds: . aspirin EC  81 mg Oral Daily  . B-complex with vitamin C  1 tablet Oral Daily  . cholecalciferol  2,000 Units Oral Daily  . docusate sodium  100 mg Oral BID  . enoxaparin (LOVENOX) injection  30 mg Subcutaneous Q24H  . feeding supplement (GLUCERNA SHAKE)  237 mL Oral TID BM  . fentaNYL  50 mcg Transdermal Q72H  . fesoterodine  4 mg Oral Daily  . folic acid  1 mg Oral Daily  . insulin aspart  0-9 Units Subcutaneous TID WC  . LORazepam  0-4 mg Intravenous Q12H  . memantine  14 mg Oral Daily  . metoprolol tartrate  12.5 mg Oral BID  . multivitamin with minerals  1 tablet Oral Daily  . PARoxetine  40 mg Oral Daily  . pravastatin  80 mg Oral Daily  . ramipril   5 mg Oral Daily  . sodium chloride  3 mL Intravenous Q12H  . thiamine  100 mg Oral Daily   Or  . thiamine  100 mg Intravenous Daily  . verapamil  180 mg Oral BID   Continuous Infusions:    Marzetta Board, MD [facility] Pager [phone]. If 7 PM - 7 AM, please contact night-coverage at [email or web address], password [facility] 06/28/2014, 3:36 PM  LOS: 2 days

## 2014-06-29 LAB — GLUCOSE, CAPILLARY
Glucose-Capillary: 120 mg/dL — ABNORMAL HIGH (ref 70–99)
Glucose-Capillary: 191 mg/dL — ABNORMAL HIGH (ref 70–99)
Glucose-Capillary: 134 mg/dL — ABNORMAL HIGH (ref 70–99)

## 2014-06-29 LAB — CBC
HCT: 23.4 % — ABNORMAL LOW (ref 36.0–46.0)
Hemoglobin: 7.8 g/dL — ABNORMAL LOW (ref 12.0–15.0)
MCH: 35 pg — ABNORMAL HIGH (ref 26.0–34.0)
MCHC: 33.3 g/dL (ref 30.0–36.0)
MCV: 104.9 fL — ABNORMAL HIGH (ref 78.0–100.0)
Platelets: 134 10*3/uL — ABNORMAL LOW (ref 150–400)
RBC: 2.23 MIL/uL — ABNORMAL LOW (ref 3.87–5.11)
RDW: 12.2 % (ref 11.5–15.5)
WBC: 8.1 10*3/uL (ref 4.0–10.5)

## 2014-06-29 LAB — PREPARE RBC (CROSSMATCH)

## 2014-06-29 MED ORDER — SODIUM CHLORIDE 0.9 % IV SOLN
Freq: Once | INTRAVENOUS | Status: AC
  Administered 2014-06-29: 12:00:00 via INTRAVENOUS

## 2014-06-29 NOTE — Progress Notes (Signed)
Subjective: 3 Days Post-Op Procedure(s) (LRB): INTRAMEDULLARY (IM) NAIL INTERTROCHANTRIC (Right) Patient reports pain as mild.   Patient seen in rounds with Dr. Wynelle Link. Patient is well, and has had no acute complaints or problems.  Appears in no distress on rounds Plan is to go Skilled nursing facility after hospital stay.  Objective: Vital signs in last 24 hours: Temp:  [98.3 F (36.8 C)-99.3 F (37.4 C)] 98.3 F (36.8 C) (02/14 0640) Pulse Rate:  [63-76] 63 (02/14 0640) Resp:  [16-20] 16 (02/14 0640) BP: (105-122)/(43-50) 105/44 mmHg (02/14 0640) SpO2:  [93 %-95 %] 95 % (02/14 0640)  Intake/Output from previous day: 02/13 0701 - 02/14 0700 In: 480 [P.O.:480] Out: 1050 [Urine:1050]  Recent Labs  06/27/14 0536 06/28/14 0500 06/29/14 0540  HGB 9.8* 8.4* 7.8*    Recent Labs  06/28/14 0500 06/29/14 0540  WBC 7.7 8.1  RBC 2.29* 2.23*  HCT 23.9* 23.4*  PLT 126* 134*    Recent Labs  06/27/14 0536 06/28/14 0500  NA 137 134*  K 3.6 3.8  CL 98 98  CO2 29 29  BUN 7 8  CREATININE 0.40* 0.34*  GLUCOSE 104* 128*  CALCIUM 8.3* 8.3*   No results for input(s): LABPT, INR in the last 72 hours.  EXAM General - Patient is Alert to questioning Extremity - Neurovascular intact Sensation intact distally Dressing - dressing C/D/I  Past Medical History  Diagnosis Date  . HTN (hypertension)   . Hyperlipidemia   . Diabetes mellitus, type 2   . Paroxysmal atrial fibrillation     chronic anticoag until 01/2013 - stopped due to high fall risk  . Right bundle branch block and incomplete left bundle branch block     with left axis deviation  . Chronic back pain     narcotic dependence  . Lumbar disc disease     dr. Trenton Gammon  . Mitral valve prolapse   . Macular degeneration   . Gastritis     RUT negative, 2005  . Diverticulosis   . Alcohol abuse   . Compression fracture 02/2011    s/p KP  . Arthritis   . Depression   . Myocardial infarction   . COPD (chronic  obstructive pulmonary disease)     Assessment/Plan: 3 Days Post-Op Procedure(s) (LRB): INTRAMEDULLARY (IM) NAIL INTERTROCHANTRIC (Right) Principal Problem:   Intertrochanteric fracture of right hip Active Problems:   HLD (hyperlipidemia)   ANXIETY DEPRESSION   Atrial fibrillation   Fall   Dementia   Hypertension   Diabetes mellitus without complication   LOW BACK PAIN, CHRONIC   Tobacco abuse   Alcohol abuse   Protein-calorie malnutrition, severe  Estimated body mass index is 16.47 kg/(m^2) as calculated from the following:   Height as of this encounter: 5\' 5"  (1.651 m).   Weight as of this encounter: 44.906 kg (99 lb). Up with therapy Discharge to SNF as per medical service  DVT Prophylaxis - Aspirin and Lovenox Partial Weight Bearing to right leg  Arlee Muslim, PA-C Orthopaedic Surgery 06/29/2014, 8:27 AM

## 2014-06-29 NOTE — Progress Notes (Signed)
PT Cancellation Note  Patient Details Name: [name] MRN: [id number] DOB: Nov 29, 1932   Cancelled Treatment:     PT deferred - decreased HgB and blood transfusion in progress.   Will follow .   [name] 06/29/2014, 1:20 PM

## 2014-06-29 NOTE — Progress Notes (Signed)
PROGRESS NOTE  Makari Sanko CLE:[id number] DOB: [birth year] DOA: 06/26/2014 PCP: No primary care provider on file.  HPI: [name] is a 81 y.o. female with past medical history of hypertension, hyperlipidemia, diabetes mellitus, coronary artery disease, COPD, atrial fibrillation not on anticoagulant due to high risk of fall, who presents with right hip after fall.  Subjective / 24 H Interval events - pain controlled, reports no appetite  Assessment/Plan: Principal Problem:   Intertrochanteric fracture of right hip Active Problems:   HLD (hyperlipidemia)   ANXIETY DEPRESSION   Atrial fibrillation   Fall   Dementia   Hypertension   Diabetes mellitus without complication   LOW BACK PAIN, CHRONIC   Tobacco abuse   Alcohol abuse   Protein-calorie malnutrition, severe  Right hip fracture - for orthopedic surgery, she underwent intramedullary nail repair on 2/11  - PT consult recommending SNF, discharge when anemia better - Pain control  Anemia - expected postoperative anemia, - Hemoglobin 7.8 this morning, from 12.4 on admission, will transfuse 1 unit, recheck CBC in the morning - Clinically without evidence of bleeding  Atrial fibrillation - she is not a candidate for full anticoagulation, will defer to orthopedic surgery as far as prophylaxis for #1  Hypertension - continue medications  Diabetes mellitus - sliding scale  Hyperlipidemia - continue pravastatin  Tobacco abuse - refuses nicotine patch  Alcohol use - she has about 1 drink per day, never withdrew   Diet: Diet - low sodium heart healthy Diet regular Fluids: none  DVT Prophylaxis: Lovenox  Code Status: DNR Family Communication: none bedside  Disposition Plan: SNF tomorrow  Consultants:  Orthopedic surgery   Procedures:  ORIF right hip 2/11   Antibiotics  Anti-infectives    Start     Dose/Rate Route Frequency Ordered Stop   06/27/14 0200  ceFAZolin (ANCEF) IVPB 1 g/50 mL premix     1 g 100  mL/hr over 30 Minutes Intravenous Every 6 hours 06/26/14 2047 06/27/14 0817       Studies  No results found.  Objective  Filed Vitals:   06/28/14 1600 06/28/14 2107 06/29/14 0640 06/29/14 1149  BP:  110/50 105/44 123/43  Pulse:  76 63 71  Temp:  99.3 F (37.4 C) 98.3 F (36.8 C) 98.7 F (37.1 C)  TempSrc:  Oral Oral Oral  Resp: 16 20 16 18   Height:      Weight:      SpO2: 95% 93% 95% 100%    Intake/Output Summary (Last 24 hours) at 06/29/14 1219 Last data filed at 06/29/14 1149  Gross per 24 hour  Intake    490 ml  Output    950 ml  Net   -460 ml   Filed Weights   06/26/14 0617  Weight: 44.906 kg (99 lb)    Exam:  General:  NAD  HEENT: no scleral icterus  Cardiovascular: RRR no MRG  Respiratory: CTA biL  Abdomen: soft, non tender  MSK/Extremities: no clubbing/cyanosis, right hip dressing intact without bleeding/drainage  Skin: no rashes  Neuro: non focal   Data Reviewed: Basic Metabolic Panel:  Recent Labs Lab 06/26/14 0355 06/27/14 0536 06/28/14 0500  NA 139 137 134*  K 3.8 3.6 3.8  CL 101 98 98  CO2 31 29 29   GLUCOSE 94 104* 128*  BUN 13 7 8   CREATININE 0.50 0.40* 0.34*  CALCIUM 9.2 8.3* 8.3*   Liver Function Tests:  Recent Labs Lab 06/26/14 0355  AST 25  ALT 16  ALKPHOS  42  BILITOT 0.5  PROT 6.7  ALBUMIN 4.1   CBC:  Recent Labs Lab 06/26/14 0355 06/27/14 0536 06/28/14 0500 06/29/14 0540  WBC 8.1 8.0 7.7 8.1  NEUTROABS 4.8  --   --   --   HGB 12.4 9.8* 8.4* 7.8*  HCT 38.8 29.6* 23.9* 23.4*  MCV 108.1* 106.5* 104.4* 104.9*  PLT 197 171 126* 134*   Cardiac Enzymes:  Recent Labs Lab 06/26/14 0355  TROPONINI <0.03   CBG:  Recent Labs Lab 06/27/14 2015 06/28/14 0722 06/28/14 1223 06/28/14 1709 06/29/14 0740  GLUCAP 146* 113* 148* 270* 120*    Recent Results (from the past 240 hour(s))  Surgical pcr screen     Status: None   Collection Time: 06/26/14 12:07 PM  Result Value Ref Range Status   MRSA,  PCR NEGATIVE NEGATIVE Final   Staphylococcus aureus NEGATIVE NEGATIVE Final    Comment:        The Xpert SA Assay (FDA approved for NASAL specimens in patients over 46 years of age), is one component of a comprehensive surveillance program.  Test performance has been validated by [facility] for patients greater than or equal to 86 year old. It is not intended to diagnose infection nor to guide or monitor treatment.      Scheduled Meds: . sodium chloride   Intravenous Once  . aspirin EC  81 mg Oral Daily  . B-complex with vitamin C  1 tablet Oral Daily  . cholecalciferol  2,000 Units Oral Daily  . docusate sodium  100 mg Oral BID  . enoxaparin (LOVENOX) injection  30 mg Subcutaneous Q24H  . feeding supplement (GLUCERNA SHAKE)  237 mL Oral TID BM  . fentaNYL  50 mcg Transdermal Q72H  . fesoterodine  4 mg Oral Daily  . folic acid  1 mg Oral Daily  . insulin aspart  0-9 Units Subcutaneous TID WC  . LORazepam  0-4 mg Intravenous Q12H  . memantine  14 mg Oral Daily  . metoprolol tartrate  12.5 mg Oral BID  . multivitamin with minerals  1 tablet Oral Daily  . PARoxetine  40 mg Oral Daily  . pravastatin  80 mg Oral Daily  . ramipril  5 mg Oral Daily  . sodium chloride  3 mL Intravenous Q12H  . thiamine  100 mg Oral Daily   Or  . thiamine  100 mg Intravenous Daily  . verapamil  180 mg Oral BID   Continuous Infusions:    Marzetta Board, MD [facility] Pager [phone]. If 7 PM - 7 AM, please contact night-coverage at [email or web address], password [facility] 06/29/2014, 12:19 PM  LOS: 3 days

## 2014-06-29 NOTE — Plan of Care (Signed)
Problem: Phase III Progression Outcomes Goal: Ambulate BID with assist as able Outcome: Not Applicable Date Met:  56/25/63 Bed to [facility] Goal: Discharge plan remains appropriate-arrangements made Outcome: Completed/Met Date Met:  06/29/14 SNF Goal: Anticoagulant follow-up in place Outcome: Not Applicable Date Met:  89/37/34 lovenox

## 2014-06-30 LAB — TYPE AND SCREEN
ABO/RH(D): O POS
Antibody Screen: NEGATIVE
UNIT DIVISION: 0

## 2014-06-30 LAB — CBC
HCT: 26.6 % — ABNORMAL LOW (ref 36.0–46.0)
Hemoglobin: 9 g/dL — ABNORMAL LOW (ref 12.0–15.0)
MCH: 34 pg (ref 26.0–34.0)
MCHC: 33.8 g/dL (ref 30.0–36.0)
MCV: 100.4 fL — AB (ref 78.0–100.0)
Platelets: 165 10*3/uL (ref 150–400)
RBC: 2.65 MIL/uL — AB (ref 3.87–5.11)
RDW: 16.3 % — ABNORMAL HIGH (ref 11.5–15.5)
WBC: 7.4 10*3/uL (ref 4.0–10.5)

## 2014-06-30 LAB — GLUCOSE, CAPILLARY
Glucose-Capillary: 97 mg/dL (ref 70–99)
Glucose-Capillary: 138 mg/dL — ABNORMAL HIGH (ref 70–99)

## 2014-06-30 MED ORDER — GLUCERNA SHAKE PO LIQD: 237.0000 mL | Freq: Three times a day (TID) | ORAL | Status: DC

## 2014-06-30 MED ORDER — DIAZEPAM 5 MG PO TABS: 5.0000 mg | ORAL_TABLET | Freq: Four times a day (QID) | ORAL | Status: AC | PRN

## 2014-06-30 NOTE — Discharge Summary (Signed)
Physician Discharge Summary  [name] OLM:[id number] DOB: 08-Dec-1932 DOA: 06/26/2014  PCP: No primary care provider on file.  Admit date: 06/26/2014 Discharge date: 06/30/2014  Time spent: 45 minutes  Recommendations for Outpatient Follow-up:  1. Follow up with [name] in 2 weeks    Discharge Diagnoses:  Principal Problem:   Intertrochanteric fracture of right hip Active Problems:   HLD (hyperlipidemia)   ANXIETY DEPRESSION   Atrial fibrillation   Fall   Dementia   Hypertension   Diabetes mellitus without complication   LOW BACK PAIN, CHRONIC   Tobacco abuse   Alcohol abuse   Protein-calorie malnutrition, severe  Discharge Condition: stable  Diet recommendation: regular  Filed Weights   06/26/14 0617  Weight: 44.906 kg (99 lb)    History of present illness:  [name] is a 81 y.o. female with past medical history of hypertension, hyperlipidemia, diabetes mellitus, coronary artery disease, COPD, atrial fibrillation not on anticoagulant due to high risk of fall, who presents with right hip after fall. [name]'s Ibarra reports that [name] has recurrent and frequent fall at home. She refuses to use walker or cane. At about 2:30 AM, her Ibarra heard calling from the [name] in the bathroom. He went to check on her, and found that [name] is sitting on the floor and was complaining of pain over right hip. [name] did not lose consciousness. No palpitation or weakness before the event. [name] denies fever, chills, fatigue, headaches, cough, chest pain, SOB, abdominal pain, diarrhea, dysuria, urgency, frequency, hematuria, skin rashes or leg swelling. No unilateral weakness, numbness or tingling sensations. No vision change or hearing loss. In ED, [name] was found to have right hip fracture by x-ray. CT-head is negative for acute abnormalities. Chest x-ray is negative. INR 1.01. No leukocytosis. Temperature normal. Electrolytes normal. [name] is admitted to inpatient for  further evaluation and treatment. Orthopedic surgeon was consulted by ED.  Hospital Course:  Right hip fracture - for orthopedic surgery, she underwent intramedullary nail repair on 2/11  - PT consult recommending SNF - Pain control Anemia - expected postoperative anemia, - Hemoglobin dipped to low 7.8, she has received 1 unit, responded appropriately, no sings of active bleeding.  - monitor CBC in 3-5 days Atrial fibrillation - she is not a candidate for full anticoagulation due to recurrent falls Hypertension - continue medications Diabetes mellitus - continue home medications Hyperlipidemia - continue pravastatin Tobacco abuse - refuses nicotine patch Alcohol use - she has about 1 drink per day, never withdrew  Procedures:  ORIF R hip 2/11   Consultations:  Orthopedic surgery   Discharge Exam: Filed Vitals:   06/29/14 1526 06/29/14 1600 06/29/14 2121 06/30/14 0539  BP: 108/51  110/52 131/57  Pulse: 67  64 76  Temp: 98.7 F (37.1 C)  99.2 F (37.3 C) 98.9 F (37.2 C)  TempSrc: Oral  Oral Oral  Resp: 16 16 16 16   Height:      Weight:      SpO2: 98% 98% 98% 90%    General: NAD Cardiovascular: RRR Respiratory: CTA biL  Discharge Instructions  Discharge Instructions    Call MD / Call 911    Complete by:  As directed   If you experience chest pain or shortness of breath, CALL 911 and be transported to the hospital emergency room.  If you develope a fever above 101 F, pus (white drainage) or increased drainage or redness at the wound, or calf pain, call your surgeon's office.  Constipation Prevention    Complete by:  As directed   Drink plenty of fluids.  Prune juice may be helpful.  You may use a stool softener, such as Colace (over the counter) 100 mg twice a day.  Use MiraLax (over the counter) for constipation as needed.     Diet - low sodium heart healthy    Complete by:  As directed      Discharge instructions    Complete by:  As directed   Maintain  surgical dressing for 3-4 days.  Then remove and cover with 4x4 gauze and tape, but must keep the area dry and clean.  Follow up in 2 weeks at [facility]. Call with any questions or concerns.     Partial weight bearing    Complete by:  As directed   % Body Weight:  50  Laterality:  right  Extremity:  Lower            Medication List    STOP taking these medications        oxyCODONE-acetaminophen 10-325 MG per tablet  Commonly known as:  PERCOCET      TAKE these medications        aspirin EC 81 MG tablet  Take 1 tablet (81 mg total) by mouth daily.     b complex vitamins tablet  Take 1 tablet by mouth daily.     diazepam 5 MG tablet  Commonly known as:  VALIUM  Take 1 tablet (5 mg total) by mouth every 6 (six) hours as needed. for anxiety     enoxaparin 40 MG/0.4ML injection  Commonly known as:  LOVENOX  Inject 0.4 mLs (40 mg total) into the skin daily.     feeding supplement (GLUCERNA SHAKE) Liqd  Take 237 mLs by mouth 3 (three) times daily between meals.     fentaNYL 50 MCG/HR  Commonly known as:  DURAGESIC - dosed mcg/hr  Place 1 patch (50 mcg total) onto the skin every 3 (three) days.     HYDROcodone-acetaminophen 5-325 MG per tablet  Commonly known as:  NORCO/VICODIN  Take 1-2 tablets by mouth every 6 (six) hours as needed for moderate pain.     metFORMIN 500 MG tablet  Commonly known as:  GLUCOPHAGE  Take 1 tablet (500 mg total) by mouth daily with breakfast.     metoprolol tartrate 25 MG tablet  Commonly known as:  LOPRESSOR  TAKE 1/2 TABLET (12.5MG )  BY MOUTH TWICE DAILY     multivitamin with minerals Tabs tablet  Take 1 tablet by mouth daily.     NAMENDA XR 14 MG Cp24 24 hr capsule  Generic drug:  memantine  TAKE 1 CAPSULE BY MOUTH DAILY.     PARoxetine 40 MG tablet  Commonly known as:  PAXIL  TAKE 1 TABLET (40 MG TOTAL) BY MOUTH EVERY MORNING.     polyethylene glycol powder powder  Commonly known as:  GLYCOLAX/MIRALAX  Take 17 g  by mouth daily.     pravastatin 80 MG tablet  Commonly known as:  PRAVACHOL  TAKE ONE TABLET BY MOUTH ONCE A DAY     ramipril 5 MG capsule  Commonly known as:  ALTACE  Take 1 capsule (5 mg total) by mouth daily.     temazepam 30 MG capsule  Commonly known as:  RESTORIL  TAKE 1 CAPSULE(S) BY MOUTH AT BEDTIME AS NEEDED     tolterodine 4 MG 24 hr capsule  Commonly known as:  DETROL  LA  TAKE ONE CAPSULE BY MOUTH DAILY     verapamil 180 MG CR tablet  Commonly known as:  CALAN-SR  TAKE ONE TABLET BY MOUTH TWICE DAILY     Vitamin D 1000 UNITS capsule  Take 2,000 Units by mouth daily.           Follow-up Information    Follow up with Toni Pole, MD. Schedule an appointment as soon as possible for a visit in 2 weeks.   Specialty:  Orthopedic Surgery   Contact information:   [address] [place] [address] [phone]       The results of significant diagnostics from this hospitalization (including imaging, microbiology, ancillary and laboratory) are listed below for reference.    Significant Diagnostic Studies: Dg Chest 1 View  06/26/2014   CLINICAL DATA:  Preoperative chest radiograph for hip fracture. Initial encounter.  EXAM: CHEST  1 VIEW  COMPARISON:  Chest radiograph from 01/21/2013  FINDINGS: The lungs are well-aerated and clear. There is no evidence of focal opacification, pleural effusion or pneumothorax.  The cardiomediastinal silhouette is within normal limits. No acute osseous abnormalities are seen. Mild degenerative change is noted at the lower lumbar spine. The [name] is status post vertebroplasty of the upper and mid to lower thoracic spine.  IMPRESSION: No acute cardiopulmonary process seen; no displaced rib fractures identified.   Electronically Signed   By: Garald Balding M.D.   On: 06/26/2014 04:39   Ct Head Wo Contrast  06/26/2014   CLINICAL DATA:  Found down in bathroom besides toilet. No loss of consciousness.  EXAM: CT HEAD  WITHOUT CONTRAST  TECHNIQUE: Contiguous axial images were obtained from the base of the skull through the vertex without intravenous contrast.  COMPARISON:  CT of the head Sep 16, 2009  FINDINGS: The ventricles and sulci are normal for age. No intraparenchymal hemorrhage, mass effect nor midline shift. Patchy supratentorial white matter hypodensities are within normal range for [name]'s age and though non-specific suggest sequelae of chronic small vessel ischemic disease. No acute large vascular territory infarcts.  No abnormal extra-axial fluid collections. Basal cisterns are patent. Moderate calcific atherosclerosis of the carotid siphons.  No skull fracture. [name] is osteopenic. The included ocular globes and orbital contents are non-suspicious. 2 mm calcification inferior ocular globe, new punctate callus low location along RIGHT scleral, recommend correlation with ophthalmological examination. The mastoid aircells and included paranasal sinuses are well-aerated.  IMPRESSION: No acute intracranial process.  Involutional changes. Mild to moderate white matter changes can be seen with chronic small vessel ischemic disease.   Electronically Signed   By: Elon Alas   On: 06/26/2014 04:58   Dg C-arm 1-60 Min-no Report  06/26/2014   CLINICAL DATA: surgery   C-ARM 1-60 MINUTES  Fluoroscopy was utilized by the requesting physician.  No radiographic  interpretation.    Dg Hip Operative Unilat With Pelvis Right  06/26/2014   CLINICAL DATA:  ORIF right hip  EXAM: OPERATIVE RIGHT HIP 2 VIEWS  TECHNIQUE: Fluoroscopic spot image(s) were submitted for interpretation post-operatively.  COMPARISON:  None.  FLUOROSCOPY TIME:  1 minutes 10 seconds  FINDINGS: Two fluoroscopic spot images of the right hip were submitted status post gamma nail with dynamic hip screw fixation of a comminuted intertrochanteric right hip fracture.  Fracture fragments are in near anatomic alignment and position.  IMPRESSION: Intraoperative  fluoroscopic spot images during ORIF of a right hip fracture, as above.   Electronically Signed   By: Bertis Ruddy  [place] Pink M.D.   On: 06/26/2014 21:05   Dg Hip Unilat With Pelvis 2-3 Views Right  06/26/2014   CLINICAL DATA:  Status post fall; right hip deformity. Initial encounter.  EXAM: RIGHT HIP (WITH PELVIS) 2-3 VIEWS  COMPARISON:  None.  FINDINGS: There is a comminuted and displaced intertrochanteric fracture of the proximal right femur, with significant shortening at the fracture site, and marked angulation of a greater trochanteric fragment. The right femoral head remains seated at the acetabulum.  No additional fractures are seen. There is chronic deformity involving the left superior and inferior pubic rami. The left hip joint is grossly unremarkable. Mild degenerative change is noted at the lower lumbar spine. The sacroiliac joints are grossly unremarkable in appearance.  The visualized bowel gas pattern is grossly unremarkable.  IMPRESSION: Comminuted and displaced intertrochanteric fracture of the proximal right femur, with significant shortening at the fracture site, and marked angulation of a greater trochanteric fragment.   Electronically Signed   By: Garald Balding M.D.   On: 06/26/2014 04:40    Microbiology: Recent Results (from the past 240 hour(s))  Surgical pcr screen     Status: None   Collection Time: 06/26/14 12:07 PM  Result Value Ref Range Status   MRSA, PCR NEGATIVE NEGATIVE Final   Staphylococcus aureus NEGATIVE NEGATIVE Final    Comment:        The Xpert SA Assay (FDA approved for NASAL specimens in patients over 3 years of age), is one component of a comprehensive surveillance program.  Test performance has been validated by [facility] for patients greater than or equal to 42 year old. It is not intended to diagnose infection nor to guide or monitor treatment.      Labs: Basic Metabolic Panel:  Recent Labs Lab 06/26/14 0355 06/27/14 0536 06/28/14 0500    NA 139 137 134*  K 3.8 3.6 3.8  CL 101 98 98  CO2 31 29 29   GLUCOSE 94 104* 128*  BUN 13 7 8   CREATININE 0.50 0.40* 0.34*  CALCIUM 9.2 8.3* 8.3*   Liver Function Tests:  Recent Labs Lab 06/26/14 0355  AST 25  ALT 16  ALKPHOS 42  BILITOT 0.5  PROT 6.7  ALBUMIN 4.1   CBC:  Recent Labs Lab 06/26/14 0355 06/27/14 0536 06/28/14 0500 06/29/14 0540 06/30/14 0513  WBC 8.1 8.0 7.7 8.1 7.4  NEUTROABS 4.8  --   --   --   --   HGB 12.4 9.8* 8.4* 7.8* 9.0*  HCT 38.8 29.6* 23.9* 23.4* 26.6*  MCV 108.1* 106.5* 104.4* 104.9* 100.4*  PLT 197 171 126* 134* 165   Cardiac Enzymes:  Recent Labs Lab 06/26/14 0355  TROPONINI <0.03   CBG:  Recent Labs Lab 06/28/14 1223 06/28/14 1709 06/29/14 0740 06/29/14 1221 06/29/14 1704  GLUCAP 148* 270* 120* 191* 134*    Signed:  GHERGHE, COSTIN  [facility] 06/30/2014, 6:55 AM

## 2014-06-30 NOTE — Progress Notes (Signed)
Physical Therapy Treatment Patient Details Name: [name] MRN: [id number] DOB: 1933-03-29 Today's Date: 06/30/2014    History of Present Illness This 81 year old female sustained a R intertrochanteric fx of her hip which is s/p IM nail.  She has macular degeneration and frequent falls  at home    PT Comments    Pt OOB in recliner feeling "uncomfortable".  With max encouragement,  Assisted pt with amb a limited distance due to anxiety/pain.  Performed some TE's AAROM to tolerance.  Pt plans to D/C to [facility] today for ST Rehab.    Follow Up Recommendations  SNF     Equipment Recommendations       Recommendations for Other Services       Precautions / Restrictions Precautions Precautions: Fall Restrictions Weight Bearing Restrictions: Yes RLE Weight Bearing: Partial weight bearing    Mobility  Bed Mobility               General bed mobility comments: Pt OOB in recliner  Transfers Overall transfer level: Needs assistance Equipment used: Rolling walker (2 wheeled) Transfers: Sit to/from Stand Sit to Stand: Max assist         General transfer comment: assist to power up  and steady plus 75% VC's on proper hand placement and controlled decend.  Ambulation/Gait Ambulation/Gait assistance: Mod assist Ambulation Distance (Feet): 4 Feet Assistive device: Rolling walker (2 wheeled) Gait Pattern/deviations: Step-to pattern;Decreased step length - right;Decreased step length - left;Trunk flexed;Shuffle;Narrow base of support Gait velocity: decreased   General Gait Details: MAX VC's to decrease anxiety and increase amb distance.  Recliner following closely behind.  Pt demonstartes increased anxiety/fear of falling. Limited activity tolerance.     Stairs            Wheelchair Mobility    Modified Rankin (Stroke Patients Only)       Balance                                    Cognition Arousal/Alertness: Awake/alert Behavior  During Therapy: WFL for tasks assessed/performed Overall Cognitive Status: Within Functional Limits for tasks assessed                      Exercises  15 resp ankle pumps 10 resp knee presses 10 reps AAROM heel slides 10 reps AROM LAQ's    General Comments        Pertinent Vitals/Pain Pain Assessment: 0-10 Pain Score: 5  Pain Location: R hip and back Pain Descriptors / Indicators: Aching;Grimacing;Sore Pain Intervention(s): Monitored during session;Repositioned;Patient requesting pain meds-RN notified    Home Living                      Prior Function            PT Goals (current goals can now be found in the care plan section) Progress towards PT goals: Progressing toward goals    Frequency  Min 3X/week    PT Plan      Co-evaluation             End of Session Equipment Utilized During Treatment: Gait belt Activity Tolerance: Patient limited by pain;Patient limited by fatigue Patient left: in chair;with call bell/phone within reach     Time: 0931-0954 PT Time Calculation (min) (ACUTE ONLY): 23 min  Charges:  $Gait Training: 8-22 mins $Therapeutic Activity: 8-22 mins  G Codes:      Rica Koyanagi  PTA [facility]  Acute  Rehab Pager      [phone]

## 2014-06-30 NOTE — Progress Notes (Signed)
Clinical Social Work Department CLINICAL SOCIAL WORK PLACEMENT NOTE 06/30/2014  Patient:  [name], [name]  Account Number:  [id number] Admit date:  06/26/2014  Clinical Social Worker:  Werner Lean, LCSW  Date/time:  06/26/2014 03:11 PM  Clinical Social Work is seeking post-discharge placement for this patient at the following level of care:   SKILLED NURSING   (*CSW will update this form in Epic as items are completed)     Patient/family provided with [place] Department of Clinical Social Work's list of facilities offering this level of care within the geographic area requested by the patient (or if unable, by the patient's family).  06/26/2014  Patient/family informed of their freedom to choose among providers that offer the needed level of care, that participate in Medicare, Medicaid or managed care program needed by the patient, have an available bed and are willing to accept the patient.    Patient/family informed of [facility]' ownership interest in [facility], as well as of the fact that they are under no obligation to receive care at this facility.  PASARR submitted to EDS on 06/26/2014 PASARR number received on 06/26/2014  FL2 transmitted to all facilities in geographic area requested by pt/family on  06/26/2014 FL2 transmitted to all facilities within larger geographic area on   Patient informed that his/her managed care company has contracts with or will negotiate with  certain facilities, including the following:     Patient/family informed of bed offers received:  06/27/2014 Patient chooses bed at [facility] Physician recommends and patient chooses bed at    Patient to be transferred to [facility] on  06/30/2014 Patient to be transferred to facility by [facility] Patient and family notified of transfer on 06/30/2014 Name of family member notified:  SPOUSE  The following physician request were entered in Epic:   Additional Comments: Pt / spouse are  in agreement with d/c to SNF today. [facility] transport required. [employer] provided authorization for SNF / transport. NSG reviewed d/c summary. scripts,avs. Scripts included in d/c packet.  Roselyn Reef Marytza Grandpre LCSW [phone]

## 2014-06-30 NOTE — Progress Notes (Signed)
Patient ID: [name], female   DOB: [birth year], 81 y.o.   MRN: [id number] Subjective: 4 Days Post-Op Procedure(s) (LRB): INTRAMEDULLARY (IM) NAIL INTERTROCHANTRIC (Right)    Patient reports pain as mild.  Again more issues with chronic neck and back DDD and pain  Objective:   VITALS:   Filed Vitals:   06/30/14 0539  BP: 131/57  Pulse: 76  Temp: 98.9 F (37.2 C)  Resp: 16    Neurovascular intact Incision: dressing C/D/I  LABS  Recent Labs  06/28/14 0500 06/29/14 0540 06/30/14 0513  HGB 8.4* 7.8* 9.0*  HCT 23.9* 23.4* 26.6*  WBC 7.7 8.1 7.4  PLT 126* 134* 165     Recent Labs  06/28/14 0500  NA 134*  K 3.8  BUN 8  CREATININE 0.34*  GLUCOSE 128*    No results for input(s): LABPT, INR in the last 72 hours.   Assessment/Plan: 4 Days Post-Op Procedure(s) (LRB): INTRAMEDULLARY (IM) NAIL INTERTROCHANTRIC (Right)   Up with therapy Discharge to SNF today PWB RLE RTC in 2 weeks

## 2014-07-01 ENCOUNTER — Ambulatory Visit: Payer: Medicare Other | Admitting: Internal Medicine

## 2014-07-01 ENCOUNTER — Other Ambulatory Visit: Payer: Self-pay | Admitting: Internal Medicine

## 2014-07-03 ENCOUNTER — Other Ambulatory Visit: Payer: Self-pay | Admitting: *Deleted

## 2014-07-03 MED ORDER — HYDROCODONE-ACETAMINOPHEN 5-325 MG PO TABS: ORAL_TABLET | ORAL | Status: DC

## 2014-07-03 NOTE — Telephone Encounter (Signed)
[facility] 

## 2014-07-04 ENCOUNTER — Encounter: Payer: Self-pay | Admitting: Internal Medicine

## 2014-07-04 ENCOUNTER — Non-Acute Institutional Stay (SKILLED_NURSING_FACILITY): Payer: Medicare Other | Admitting: Internal Medicine

## 2014-07-04 DIAGNOSIS — R634 Abnormal weight loss: Secondary | ICD-10-CM

## 2014-07-04 DIAGNOSIS — D638 Anemia in other chronic diseases classified elsewhere: Secondary | ICD-10-CM

## 2014-07-04 DIAGNOSIS — M545 Low back pain, unspecified: Secondary | ICD-10-CM

## 2014-07-04 DIAGNOSIS — I10 Essential (primary) hypertension: Secondary | ICD-10-CM

## 2014-07-04 DIAGNOSIS — F0390 Unspecified dementia without behavioral disturbance: Secondary | ICD-10-CM

## 2014-07-04 DIAGNOSIS — E43 Unspecified severe protein-calorie malnutrition: Secondary | ICD-10-CM

## 2014-07-04 DIAGNOSIS — E119 Type 2 diabetes mellitus without complications: Secondary | ICD-10-CM

## 2014-07-04 DIAGNOSIS — S72141A Displaced intertrochanteric fracture of right femur, initial encounter for closed fracture: Secondary | ICD-10-CM

## 2014-07-04 DIAGNOSIS — H3530 Unspecified macular degeneration: Secondary | ICD-10-CM | POA: Insufficient documentation

## 2014-07-04 DIAGNOSIS — I480 Paroxysmal atrial fibrillation: Secondary | ICD-10-CM

## 2014-07-04 DIAGNOSIS — W19XXXA Unspecified fall, initial encounter: Secondary | ICD-10-CM

## 2014-07-04 NOTE — Progress Notes (Signed)
Patient ID: [name], female   DOB: 25-May-1932, 81 y.o.   MRN: [id number]    HISTORY AND PHYSICAL  Location:  [facility] Room Number: [id number] Place of Service: SNF [phone])   Extended Emergency Contact Information Primary Emergency Contact: Simonian,Joseph Address: [place], [place] Phone: [phone] Work Phone: [phone] Mobile Phone: [phone] Relation: Spouse Secondary Emergency Contact: [place] Phone: [phone] Relation: Daughter  Advanced Directive information    Chief Complaint  Patient presents with  . New Admit To SNF    Following hospitalization for an intertrochanteric right hip fracture sustained in a fall    HPI:  Admission to skilled nursing facility on 06/30/14. She was hospitalized from 06/26/14 through 06/30/14. She had fallen and suffered an intertrochanteric fracture of the right hip. Dr. Paralee Cancel tendon intramedullary nail repair on 06/26/14. She is now admitted to SNF for continued rehabilitative measures.  This is a complicated woman who is frail and with multiple medical diagnoses. She lives at home with her husband she. She has had chronic back pains which limit her ability to get around. She has had multiple falls at home. Other problems include dementia, hypertension, diabetes mellitus, chronic anxiety with depression, chronic atrial fibrillation that is not anticoagulated due to fall history, and significant protein calorie malnutrition.  While here she will be engaged in physical therapy and occupational therapy. Her chronic medical problems we followed routinely. Dietary measures to try to get her to regain weight will be attempted.  Past Medical History  Diagnosis Date  . Paroxysmal atrial fibrillation     chronic anticoag until 01/2013 - stopped due to high fall risk  . Right bundle branch block and incomplete left bundle  branch block     with left axis deviation  . Chronic back pain     narcotic dependence  . Lumbar disc disease     dr. Trenton Gammon  . Mitral valve prolapse   . Macular degeneration   . Gastritis     RUT negative, 2005  . Diverticulosis   . Alcohol abuse   . Compression fracture 02/2011    s/p KP  . Arthritis   . Depression   . Myocardial infarction   . COPD (chronic obstructive pulmonary disease)   . Intertrochanteric fracture of right hip 06/26/2014  . ANXIETY DEPRESSION 10/01/2009    Qualifier: Diagnosis of  By: Niel Hummer MD, Lorinda Creed   . Dementia 01/28/2013  . Diabetes mellitus without complication 81/85/6314  . Fall 01/21/2013  . HLD (hyperlipidemia) 05/30/2007    Qualifier: Diagnosis of  By: Niel Hummer MD, Clio Hypertension 02/20/2013  . Insomnia 01/28/2013  . Protein-calorie malnutrition, severe 06/26/2014  . Weight loss, unintentional 02/08/2013  . VITAMIN D DEFICIENCY 03/20/2008    Qualifier: Diagnosis of  By: Cori Razor RN, Mikal Plane Tobacco abuse 06/26/2014    Past Surgical History  Procedure Laterality Date  . Right ankle surgery    . Knee arthroscopy    . Intramedullary (im) nail intertrochanteric Right 06/26/2014    Procedure: INTRAMEDULLARY (IM) NAIL INTERTROCHANTRIC;  Surgeon: Mauri Pole, MD;  Location: [facility];  Service: Orthopedics;  Laterality: Right;    Patient Care Team: Deboraha Sprang, MD (Cardiology) Hurman Horn, MD (Ophthalmology)  History   Social History  . Marital Status: Married  Spouse Name: N/A  . Number of Children: N/A  . Years of Education: N/A   Occupational History  . Not on file.   Social History Main Topics  . Smoking status: Current Every Day Smoker -- 0.50 packs/day for 65 years    Types: Cigarettes  . Smokeless tobacco: Never Used  . Alcohol Use: Yes     Comment: Occasional  . Drug Use: No  . Sexual Activity: No   Other Topics Concern  . Not on file   Social History Narrative     reports that she has been  smoking Cigarettes.  She has a 32.5 pack-year smoking history. She has never used smokeless tobacco. She reports that she drinks alcohol. She reports that she does not use illicit drugs.  Family History  Problem Relation Age of Onset  . Heart disease Mother   . Cancer Father     throat  . Atrial fibrillation Sister    Family Status  Relation Status Death Age  . Mother Deceased 8  . Father Deceased 71  . Sister Alive     Immunization History  Administered Date(s) Administered  . Influenza Split 02/20/2012  . Influenza Whole 04/29/2009, 03/17/2010  . Influenza,inj,Quad PF,36+ Mos 02/08/2013, 01/21/2014  . Pneumococcal Conjugate-13 02/05/2014  . Pneumococcal Polysaccharide-23 02/20/2012  . Tetanus 05/23/2012    Allergies  Allergen Reactions  . Codeine Other (See Comments)    unknown  . Morphine And Related Other (See Comments)    Pt had severe respiratory depression and excessive sedation during a recent hospitalization and was told it was 2/2 morphine.    Medications: Patient's Medications  New Prescriptions   No medications on file  Previous Medications   ASPIRIN EC 81 MG TABLET    Take 1 tablet (81 mg total) by mouth daily.   B COMPLEX VITAMINS TABLET    Take 1 tablet by mouth daily.   CHOLECALCIFEROL (VITAMIN D) 1000 UNITS CAPSULE    Take 2,000 Units by mouth daily.    DIAZEPAM (VALIUM) 5 MG TABLET    Take 1 tablet (5 mg total) by mouth every 6 (six) hours as needed. for anxiety   ENOXAPARIN (LOVENOX) 40 MG/0.4ML INJECTION    Inject 0.4 mLs (40 mg total) into the skin daily.   FEEDING SUPPLEMENT, GLUCERNA SHAKE, (GLUCERNA SHAKE) LIQD    Take 237 mLs by mouth 3 (three) times daily between meals.   FENTANYL (DURAGESIC - DOSED MCG/HR) 50 MCG/HR    Place 1 patch (50 mcg total) onto the skin every 3 (three) days.   HYDROCODONE-ACETAMINOPHEN (NORCO/VICODIN) 5-325 MG PER TABLET    Take one to two tablets by mouth every 4 hours as needed for pain. Do not exceed 4gm of Tylenol  in 24 hours.   METFORMIN (GLUCOPHAGE) 500 MG TABLET    Take 1 tablet (500 mg total) by mouth daily with breakfast.   METOPROLOL TARTRATE (LOPRESSOR) 25 MG TABLET    TAKE 1/2 TABLET (12.$RemoveBefore'5MG'[id number]$ )  BY MOUTH TWICE DAILY   MULTIPLE VITAMIN (MULTIVITAMIN WITH MINERALS) TABS TABLET    Take 1 tablet by mouth daily.   NAMENDA XR 14 MG CP24    TAKE 1 CAPSULE BY MOUTH DAILY.   PAROXETINE (PAXIL) 40 MG TABLET    TAKE 1 TABLET (40 MG TOTAL) BY MOUTH EVERY MORNING.   POLYETHYLENE GLYCOL POWDER (GLYCOLAX/MIRALAX) POWDER    Take 17 g by mouth daily.   PRAVASTATIN (PRAVACHOL) 80 MG TABLET    TAKE ONE TABLET BY MOUTH ONCE  A DAY   RAMIPRIL (ALTACE) 5 MG CAPSULE    Take 1 capsule (5 mg total) by mouth daily.   TEMAZEPAM (RESTORIL) 30 MG CAPSULE    TAKE 1 CAPSULE(S) BY MOUTH AT BEDTIME AS NEEDED   TOLTERODINE (DETROL LA) 4 MG 24 HR CAPSULE    TAKE ONE CAPSULE BY MOUTH DAILY   VERAPAMIL (CALAN-SR) 180 MG CR TABLET    TAKE ONE TABLET BY MOUTH TWICE DAILY  Modified Medications   No medications on file  Discontinued Medications   No medications on file    Review of Systems  Constitutional: Positive for fatigue. Negative for fever, chills, diaphoresis, activity change, appetite change and unexpected weight change.       Debilitated elderly woman who is quite frail in appearance.  HENT: Negative for congestion, ear discharge, ear pain, hearing loss, postnasal drip, rhinorrhea, sore throat, tinnitus, trouble swallowing and voice change.   Eyes: Negative for pain, redness, itching and visual disturbance.       History macular degeneration with visual deterioration.  Respiratory: Negative for cough, choking, shortness of breath and wheezing.   Cardiovascular: Positive for leg swelling. Negative for chest pain and palpitations.  Gastrointestinal: Negative for nausea, abdominal pain, diarrhea, constipation and abdominal distention.  Endocrine: Negative for cold intolerance, heat intolerance, polydipsia, polyphagia and  polyuria.  Genitourinary: Negative for dysuria, urgency, frequency, hematuria, flank pain, vaginal discharge, difficulty urinating and pelvic pain.       Poor appetite.  Musculoskeletal: Negative for myalgias, back pain, arthralgias, gait problem, neck pain and neck stiffness.       Recent intertrochanteric fracture right hip. Medullary rod in place. Wound is clean and dry.  Skin: Negative for color change, pallor and rash.  Allergic/Immunologic: Negative.   Neurological: Negative for dizziness, tremors, seizures, syncope, weakness, numbness and headaches.       History of dementia and chronic confusion.  Hematological: Negative for adenopathy. Does not bruise/bleed easily.  Psychiatric/Behavioral: Positive for confusion. Negative for suicidal ideas, hallucinations, behavioral problems, sleep disturbance, dysphoric mood and agitation. The patient is nervous/anxious. The patient is not hyperactive.     Filed Vitals:   07/04/14 1614  BP: 144/78  Pulse: 80  Temp: 98.4 F (36.9 C)  Resp: 16  Height: $Remove'5\' 5"'uRqmbdB$  (1.651 m)  Weight: 99 lb (44.906 kg)   Body mass index is 16.47 kg/(m^2).  Physical Exam  Constitutional: She appears well-developed and well-nourished. No distress.  Frail, elderly, thin female  HENT:  Right Ear: External ear normal.  Left Ear: External ear normal.  Nose: Nose normal.  Mouth/Throat: Oropharynx is clear and moist. No oropharyngeal exudate.  Eyes: Conjunctivae and EOM are normal. Pupils are equal, round, and reactive to light. No scleral icterus.  Severe loss of vision. Macular degeneration. Bilateral lens opacities.  Neck: No JVD present. No tracheal deviation present. No thyromegaly present.  Cardiovascular: Normal rate, normal heart sounds and intact distal pulses.  Exam reveals no gallop and no friction rub.   No murmur heard. Atrial fibrillation with controlled rate  Pulmonary/Chest: Effort normal. No respiratory distress. She has no wheezes. She has no rales.  She exhibits no tenderness.  Abdominal: She exhibits no distension and no mass. There is no tenderness.  Musculoskeletal: She exhibits edema. She exhibits no tenderness.  Scoliosis. Clean incision of the right hip. Some pain in the right hip movement of the leg.  Lymphadenopathy:    She has no cervical adenopathy.  Neurological: She is alert. No cranial nerve deficit.  Coordination normal.  Significant memory deficits  Skin: No rash noted. She is not diaphoretic. No erythema. No pallor.  Psychiatric: She has a normal mood and affect. Her behavior is normal. Judgment and thought content normal.     Labs reviewed: Admission on 06/26/2014, Discharged on 06/30/2014  Component Date Value Ref Range Status  . WBC 06/26/2014 8.1  4.0 - 10.5 K/uL Final  . RBC 06/26/2014 3.59* 3.87 - 5.11 MIL/uL Final  . Hemoglobin 06/26/2014 12.4  12.0 - 15.0 g/dL Final  . HCT 06/26/2014 38.8  36.0 - 46.0 % Final  . MCV 06/26/2014 108.1* 78.0 - 100.0 fL Final  . MCH 06/26/2014 34.5* 26.0 - 34.0 pg Final  . MCHC 06/26/2014 32.0  30.0 - 36.0 g/dL Final  . RDW 06/26/2014 12.4  11.5 - 15.5 % Final  . Platelets 06/26/2014 197  150 - 400 K/uL Final  . Neutrophils Relative % 06/26/2014 60  43 - 77 % Final  . Neutro Abs 06/26/2014 4.8  1.7 - 7.7 K/uL Final  . Lymphocytes Relative 06/26/2014 27  12 - 46 % Final  . Lymphs Abs 06/26/2014 2.2  0.7 - 4.0 K/uL Final  . Monocytes Relative 06/26/2014 10  3 - 12 % Final  . Monocytes Absolute 06/26/2014 0.8  0.1 - 1.0 K/uL Final  . Eosinophils Relative 06/26/2014 3  0 - 5 % Final  . Eosinophils Absolute 06/26/2014 0.3  0.0 - 0.7 K/uL Final  . Basophils Relative 06/26/2014 0  0 - 1 % Final  . Basophils Absolute 06/26/2014 0.0  0.0 - 0.1 K/uL Final  . Sodium 06/26/2014 139  135 - 145 mmol/L Final  . Potassium 06/26/2014 3.8  3.5 - 5.1 mmol/L Final  . Chloride 06/26/2014 101  96 - 112 mmol/L Final  . CO2 06/26/2014 31  19 - 32 mmol/L Final  . Glucose, Bld 06/26/2014 94  70 -  99 mg/dL Final  . BUN 06/26/2014 13  6 - 23 mg/dL Final  . Creatinine, Ser 06/26/2014 0.50  0.50 - 1.10 mg/dL Final  . Calcium 06/26/2014 9.2  8.4 - 10.5 mg/dL Final  . Total Protein 06/26/2014 6.7  6.0 - 8.3 g/dL Final  . Albumin 06/26/2014 4.1  3.5 - 5.2 g/dL Final  . AST 06/26/2014 25  0 - 37 U/L Final  . ALT 06/26/2014 16  0 - 35 U/L Final  . Alkaline Phosphatase 06/26/2014 42  39 - 117 U/L Final  . Total Bilirubin 06/26/2014 0.5  0.3 - 1.2 mg/dL Final  . GFR calc non Af Amer 06/26/2014 90* >90 mL/min Final  . GFR calc Af Amer 06/26/2014 >90  >90 mL/min Final   Comment: (NOTE) The eGFR has been calculated using the CKD EPI equation. This calculation has not been validated in all clinical situations. eGFR's persistently <90 mL/min signify possible Chronic Kidney Disease.   . Anion gap 06/26/2014 7  5 - 15 Final  . Troponin I 06/26/2014 <0.03  <0.031 ng/mL Final   Comment:        NO INDICATION OF MYOCARDIAL INJURY.   . Prothrombin Time 06/26/2014 13.4  11.6 - 15.2 seconds Final  . INR 06/26/2014 1.01  0.00 - 1.49 Final  . Color, Urine 06/26/2014 YELLOW  YELLOW Final  . APPearance 06/26/2014 CLEAR  CLEAR Final  . Specific Gravity, Urine 06/26/2014 1.016  1.005 - 1.030 Final  . pH 06/26/2014 8.0  5.0 - 8.0 Final  . Glucose, UA 06/26/2014 NEGATIVE  NEGATIVE mg/dL Final  .  Hgb urine dipstick 06/26/2014 NEGATIVE  NEGATIVE Final  . Bilirubin Urine 06/26/2014 NEGATIVE  NEGATIVE Final  . Ketones, ur 06/26/2014 15* NEGATIVE mg/dL Final  . Protein, ur 06/26/2014 NEGATIVE  NEGATIVE mg/dL Final  . Urobilinogen, UA 06/26/2014 0.2  0.0 - 1.0 mg/dL Final  . Nitrite 06/26/2014 NEGATIVE  NEGATIVE Final  . Leukocytes, UA 06/26/2014 NEGATIVE  NEGATIVE Final   MICROSCOPIC NOT DONE ON URINES WITH NEGATIVE PROTEIN, BLOOD, LEUKOCYTES, NITRITE, OR GLUCOSE <1000 mg/dL.  Marland Kitchen aPTT 06/26/2014 31  24 - 37 seconds Final  . ABO/RH(D) 06/26/2014 O POS   Final  . Antibody Screen 06/26/2014 NEG   Final  .  Sample Expiration 06/26/2014 06/29/2014   Final  . Unit Number 06/26/2014 [id number]   Final  . Blood Component Type 06/26/2014 RED CELLS,LR   Final  . Unit division 06/26/2014 00   Final  . Status of Unit 06/26/2014 ISSUED,FINAL   Final  . Transfusion Status 06/26/2014 OK TO TRANSFUSE   Final  . Crossmatch Result 06/26/2014 Compatible   Final  . aPTT 06/26/2014 31  24 - 37 seconds Final  . Glucose-Capillary 06/26/2014 99  70 - 99 mg/dL Final  . Comment 1 06/26/2014 Notify RN   Final  . Glucose-Capillary 06/26/2014 113* 70 - 99 mg/dL Final  . Comment 1 06/26/2014 Notify RN   Final  . MRSA, PCR 06/26/2014 NEGATIVE  NEGATIVE Final  . Staphylococcus aureus 06/26/2014 NEGATIVE  NEGATIVE Final   Comment:        The Xpert SA Assay (FDA approved for NASAL specimens in patients over 69 years of age), is one component of a comprehensive surveillance program.  Test performance has been validated by [facility] for patients greater than or equal to 15 year old. It is not intended to diagnose infection nor to guide or monitor treatment.   . WBC 06/27/2014 8.0  4.0 - 10.5 K/uL Final  . RBC 06/27/2014 2.78* 3.87 - 5.11 MIL/uL Final  . Hemoglobin 06/27/2014 9.8* 12.0 - 15.0 g/dL Final   Comment: REPEATED TO VERIFY DELTA CHECK NOTED   . HCT 06/27/2014 29.6* 36.0 - 46.0 % Final  . MCV 06/27/2014 106.5* 78.0 - 100.0 fL Final  . MCH 06/27/2014 35.3* 26.0 - 34.0 pg Final  . MCHC 06/27/2014 33.1  30.0 - 36.0 g/dL Final  . RDW 06/27/2014 12.5  11.5 - 15.5 % Final  . Platelets 06/27/2014 171  150 - 400 K/uL Final  . Sodium 06/27/2014 137  135 - 145 mmol/L Final  . Potassium 06/27/2014 3.6  3.5 - 5.1 mmol/L Final  . Chloride 06/27/2014 98  96 - 112 mmol/L Final  . CO2 06/27/2014 29  19 - 32 mmol/L Final  . Glucose, Bld 06/27/2014 104* 70 - 99 mg/dL Final  . BUN 06/27/2014 7  6 - 23 mg/dL Final  . Creatinine, Ser 06/27/2014 0.40* 0.50 - 1.10 mg/dL Final  . Calcium 06/27/2014 8.3* 8.4 - 10.5  mg/dL Final  . GFR calc non Af Amer 06/27/2014 >90  >90 mL/min Final  . GFR calc Af Amer 06/27/2014 >90  >90 mL/min Final   Comment: (NOTE) The eGFR has been calculated using the CKD EPI equation. This calculation has not been validated in all clinical situations. eGFR's persistently <90 mL/min signify possible Chronic Kidney Disease.   . Anion gap 06/27/2014 10  5 - 15 Final  . Glucose-Capillary 06/26/2014 99  70 - 99 mg/dL Final  . Comment 1 06/26/2014 Notify RN   Final  .  Glucose-Capillary 06/26/2014 96  70 - 99 mg/dL Final  . Comment 1 06/26/2014 Notify RN   Final  . Glucose-Capillary 06/26/2014 84  70 - 99 mg/dL Final  . Comment 1 06/26/2014 Notify RN   Final  . Glucose-Capillary 06/27/2014 101* 70 - 99 mg/dL Final  . Glucose-Capillary 06/27/2014 210* 70 - 99 mg/dL Final  . WBC 06/28/2014 7.7  4.0 - 10.5 K/uL Final  . RBC 06/28/2014 2.29* 3.87 - 5.11 MIL/uL Final  . Hemoglobin 06/28/2014 8.4* 12.0 - 15.0 g/dL Final  . HCT 06/28/2014 23.9* 36.0 - 46.0 % Final  . MCV 06/28/2014 104.4* 78.0 - 100.0 fL Final  . MCH 06/28/2014 36.7* 26.0 - 34.0 pg Final  . MCHC 06/28/2014 35.1  30.0 - 36.0 g/dL Final  . RDW 06/28/2014 12.3  11.5 - 15.5 % Final  . Platelets 06/28/2014 126* 150 - 400 K/uL Final   Comment: SPECIMEN CHECKED FOR CLOTS REPEATED TO VERIFY DELTA CHECK NOTED   . Sodium 06/28/2014 134* 135 - 145 mmol/L Final  . Potassium 06/28/2014 3.8  3.5 - 5.1 mmol/L Final  . Chloride 06/28/2014 98  96 - 112 mmol/L Final  . CO2 06/28/2014 29  19 - 32 mmol/L Final  . Glucose, Bld 06/28/2014 128* 70 - 99 mg/dL Final  . BUN 06/28/2014 8  6 - 23 mg/dL Final  . Creatinine, Ser 06/28/2014 0.34* 0.50 - 1.10 mg/dL Final  . Calcium 06/28/2014 8.3* 8.4 - 10.5 mg/dL Final  . GFR calc non Af Amer 06/28/2014 >90  >90 mL/min Final  . GFR calc Af Amer 06/28/2014 >90  >90 mL/min Final   Comment: (NOTE) The eGFR has been calculated using the CKD EPI equation. This calculation has not been  validated in all clinical situations. eGFR's persistently <90 mL/min signify possible Chronic Kidney Disease.   . Anion gap 06/28/2014 7  5 - 15 Final  . Glucose-Capillary 06/27/2014 192* 70 - 99 mg/dL Final  . Glucose-Capillary 06/27/2014 146* 70 - 99 mg/dL Final  . Glucose-Capillary 06/28/2014 113* 70 - 99 mg/dL Final  . Glucose-Capillary 06/28/2014 148* 70 - 99 mg/dL Final  . WBC 06/29/2014 8.1  4.0 - 10.5 K/uL Final  . RBC 06/29/2014 2.23* 3.87 - 5.11 MIL/uL Final  . Hemoglobin 06/29/2014 7.8* 12.0 - 15.0 g/dL Final  . HCT 06/29/2014 23.4* 36.0 - 46.0 % Final  . MCV 06/29/2014 104.9* 78.0 - 100.0 fL Final  . MCH 06/29/2014 35.0* 26.0 - 34.0 pg Final  . MCHC 06/29/2014 33.3  30.0 - 36.0 g/dL Final  . RDW 06/29/2014 12.2  11.5 - 15.5 % Final  . Platelets 06/29/2014 134* 150 - 400 K/uL Final  . Glucose-Capillary 06/28/2014 270* 70 - 99 mg/dL Final  . Glucose-Capillary 06/29/2014 120* 70 - 99 mg/dL Final  . Order Confirmation 06/29/2014 ORDER PROCESSED BY BLOOD BANK   Final  . Glucose-Capillary 06/29/2014 191* 70 - 99 mg/dL Final  . WBC 06/30/2014 7.4  4.0 - 10.5 K/uL Final  . RBC 06/30/2014 2.65* 3.87 - 5.11 MIL/uL Final  . Hemoglobin 06/30/2014 9.0* 12.0 - 15.0 g/dL Final  . HCT 06/30/2014 26.6* 36.0 - 46.0 % Final  . MCV 06/30/2014 100.4* 78.0 - 100.0 fL Final  . MCH 06/30/2014 34.0  26.0 - 34.0 pg Final  . MCHC 06/30/2014 33.8  30.0 - 36.0 g/dL Final  . RDW 06/30/2014 16.3* 11.5 - 15.5 % Final  . Platelets 06/30/2014 165  150 - 400 K/uL Final  . Glucose-Capillary 06/29/2014 134* 70 -  99 mg/dL Final  . Glucose-Capillary 06/30/2014 97  70 - 99 mg/dL Final  . Glucose-Capillary 06/30/2014 138* 70 - 99 mg/dL Final    Dg Chest 1 View  06/26/2014   CLINICAL DATA:  Preoperative chest radiograph for hip fracture. Initial encounter.  EXAM: CHEST  1 VIEW  COMPARISON:  Chest radiograph from 01/21/2013  FINDINGS: The lungs are well-aerated and clear. There is no evidence of focal  opacification, pleural effusion or pneumothorax.  The cardiomediastinal silhouette is within normal limits. No acute osseous abnormalities are seen. Mild degenerative change is noted at the lower lumbar spine. The patient is status post vertebroplasty of the upper and mid to lower thoracic spine.  IMPRESSION: No acute cardiopulmonary process seen; no displaced rib fractures identified.   Electronically Signed   By: Garald Balding M.D.   On: 06/26/2014 04:39   Ct Head Wo Contrast  06/26/2014   CLINICAL DATA:  Found down in bathroom besides toilet. No loss of consciousness.  EXAM: CT HEAD WITHOUT CONTRAST  TECHNIQUE: Contiguous axial images were obtained from the base of the skull through the vertex without intravenous contrast.  COMPARISON:  CT of the head Sep 16, 2009  FINDINGS: The ventricles and sulci are normal for age. No intraparenchymal hemorrhage, mass effect nor midline shift. Patchy supratentorial white matter hypodensities are within normal range for patient's age and though non-specific suggest sequelae of chronic small vessel ischemic disease. No acute large vascular territory infarcts.  No abnormal extra-axial fluid collections. Basal cisterns are patent. Moderate calcific atherosclerosis of the carotid siphons.  No skull fracture. Patient is osteopenic. The included ocular globes and orbital contents are non-suspicious. 2 mm calcification inferior ocular globe, new punctate callus low location along RIGHT scleral, recommend correlation with ophthalmological examination. The mastoid aircells and included paranasal sinuses are well-aerated.  IMPRESSION: No acute intracranial process.  Involutional changes. Mild to moderate white matter changes can be seen with chronic small vessel ischemic disease.   Electronically Signed   By: Elon Alas   On: 06/26/2014 04:58   Dg C-arm 1-60 Min-no Report  06/26/2014   CLINICAL DATA: surgery   C-ARM 1-60 MINUTES  Fluoroscopy was utilized by the requesting  physician.  No radiographic  interpretation.    Dg Hip Operative Unilat With Pelvis Right  06/26/2014   CLINICAL DATA:  ORIF right hip  EXAM: OPERATIVE RIGHT HIP 2 VIEWS  TECHNIQUE: Fluoroscopic spot image(s) were submitted for interpretation post-operatively.  COMPARISON:  None.  FLUOROSCOPY TIME:  1 minutes 10 seconds  FINDINGS: Two fluoroscopic spot images of the right hip were submitted status post gamma nail with dynamic hip screw fixation of a comminuted intertrochanteric right hip fracture.  Fracture fragments are in near anatomic alignment and position.  IMPRESSION: Intraoperative fluoroscopic spot images during ORIF of a right hip fracture, as above.   Electronically Signed   By: Julian Hy M.D.   On: 06/26/2014 21:05   Dg Hip Unilat With Pelvis 2-3 Views Right  06/26/2014   CLINICAL DATA:  Status post fall; right hip deformity. Initial encounter.  EXAM: RIGHT HIP (WITH PELVIS) 2-3 VIEWS  COMPARISON:  None.  FINDINGS: There is a comminuted and displaced intertrochanteric fracture of the proximal right femur, with significant shortening at the fracture site, and marked angulation of a greater trochanteric fragment. The right femoral head remains seated at the acetabulum.  No additional fractures are seen. There is chronic deformity involving the left superior and inferior pubic rami. The left hip joint  is grossly unremarkable. Mild degenerative change is noted at the lower lumbar spine. The sacroiliac joints are grossly unremarkable in appearance.  The visualized bowel gas pattern is grossly unremarkable.  IMPRESSION: Comminuted and displaced intertrochanteric fracture of the proximal right femur, with significant shortening at the fracture site, and marked angulation of a greater trochanteric fragment.   Electronically Signed   By: Garald Balding M.D.   On: 06/26/2014 04:40     Assessment/Plan  1. Intertrochanteric fracture of right hip, closed, initial encounter Intramedullary rod  surgery. Patient engaged in physical therapy and occupational therapy. Pain management may be a problem. She apparently is in chronic pain related to her spine and her right hip pains are very aggravating to her.  2. Anemia of chronic disease Problem is compounded by recent blood loss. Routine lab follow-up with CBC.  3. Paroxysmal atrial fibrillation High fall risk so anticoagulation was not recommended. Currently in atrial fibrillation at controlled rate.  4. Diabetes mellitus without complication Continue current medicines  5. Fall, initial encounter At risk for additional falls  6. Essential hypertension Controlled  7. Bilateral low back pain without sciatica Continue current medications which include fentanyl patch and Norco  8. Protein-calorie malnutrition, severe Dietary supplements have been ordered  9. Dementia, without behavioral disturbance Dementia appears to be relatively mild at this time  10. Weight loss, unintentional Dietary supplements.

## 2014-08-05 ENCOUNTER — Other Ambulatory Visit: Payer: Self-pay | Admitting: *Deleted

## 2014-08-05 MED ORDER — HYDROCODONE-ACETAMINOPHEN 5-325 MG PO TABS: ORAL_TABLET | ORAL | Status: DC

## 2014-08-05 NOTE — Telephone Encounter (Signed)
[facility] 

## 2014-08-06 ENCOUNTER — Non-Acute Institutional Stay (SKILLED_NURSING_FACILITY): Payer: Medicare Other | Admitting: Adult Health

## 2014-08-06 ENCOUNTER — Encounter: Payer: Self-pay | Admitting: Adult Health

## 2014-08-06 DIAGNOSIS — N3281 Overactive bladder: Secondary | ICD-10-CM

## 2014-08-06 DIAGNOSIS — I10 Essential (primary) hypertension: Secondary | ICD-10-CM | POA: Diagnosis not present

## 2014-08-06 DIAGNOSIS — F329 Major depressive disorder, single episode, unspecified: Secondary | ICD-10-CM

## 2014-08-06 DIAGNOSIS — G4700 Insomnia, unspecified: Secondary | ICD-10-CM

## 2014-08-06 DIAGNOSIS — I482 Chronic atrial fibrillation, unspecified: Secondary | ICD-10-CM

## 2014-08-06 DIAGNOSIS — E785 Hyperlipidemia, unspecified: Secondary | ICD-10-CM

## 2014-08-06 DIAGNOSIS — K5900 Constipation, unspecified: Secondary | ICD-10-CM

## 2014-08-06 DIAGNOSIS — F0390 Unspecified dementia without behavioral disturbance: Secondary | ICD-10-CM | POA: Diagnosis not present

## 2014-08-06 DIAGNOSIS — F419 Anxiety disorder, unspecified: Secondary | ICD-10-CM

## 2014-08-06 DIAGNOSIS — F32A Depression, unspecified: Secondary | ICD-10-CM

## 2014-08-06 DIAGNOSIS — S72141A Displaced intertrochanteric fracture of right femur, initial encounter for closed fracture: Secondary | ICD-10-CM

## 2014-08-06 DIAGNOSIS — E119 Type 2 diabetes mellitus without complications: Secondary | ICD-10-CM | POA: Diagnosis not present

## 2014-08-06 NOTE — Progress Notes (Signed)
Patient ID: [name], female   DOB: 1933-01-28, 81 y.o.   MRN: [id number]   08/06/2014  Facility:  Nursing Home Location:  [place] Room Number: 397-Q LEVEL OF CARE:  SNF (31)   Chief Complaint  Patient presents with  . Discharge Note    Right hip fracture S/P S/P intramedullary nail repair, diabetes mellitus, hypertension, CAD, dementia, depression, constipation, hyperlipidemia, overactive bladder, atrial fibrillation, insomnia and anxiety    HISTORY OF PRESENT ILLNESS:  This is a 81 year old female who is for discharge home with home health PT for ambulation and lower extremity strengthening, OT for self care skills, nursing for disease management and home health aide for bathing. She has been admitted to [facility] on 06/30/14 from [facility]. She has PMH of hyperlipidemia, anxiety, atrial fibrillation, dementia, hypertension, diabetes mellitus, alcohol abuse, CAD and COPD. She fell at home and sustained a right hip fracture. She had intramedullary nail repair on 06/26/14.  Patient was admitted to this facility for short-term rehabilitation after the patient's recent hospitalization.  Patient has completed SNF rehabilitation and therapy has cleared the patient for discharge.  PAST MEDICAL HISTORY:  Past Medical History  Diagnosis Date  . Paroxysmal atrial fibrillation     chronic anticoag until 01/2013 - stopped due to high fall risk  . Right bundle branch block and incomplete left bundle branch block     with left axis deviation  . Chronic back pain     narcotic dependence  . Lumbar disc disease     dr. Trenton Gammon  . Mitral valve prolapse   . Macular degeneration   . Gastritis     RUT negative, 2005  . Diverticulosis   . Alcohol abuse   . Compression fracture 02/2011    s/p KP  . Arthritis   . Depression   . Myocardial infarction   . COPD (chronic obstructive pulmonary disease)   . Intertrochanteric fracture of right hip 06/26/2014    . ANXIETY DEPRESSION 10/01/2009    Qualifier: Diagnosis of  By: Niel Hummer MD, Lorinda Creed   . Dementia 01/28/2013  . Diabetes mellitus without complication [id number]/41/9379  . Fall 01/21/2013  . HLD (hyperlipidemia) 05/30/2007    Qualifier: Diagnosis of  By: Niel Hummer MD, [place] Hypertension 02/20/2013  . Insomnia 01/28/2013  . Protein-calorie malnutrition, severe 06/26/2014  . Weight loss, unintentional 02/08/2013  . VITAMIN D DEFICIENCY 03/20/2008    Qualifier: Diagnosis of  By: Cori Razor RN, Mikal Plane Tobacco abuse 06/26/2014    CURRENT MEDICATIONS: Reviewed per MAR/see medication list  Allergies  Allergen Reactions  . Codeine Other (See Comments)    unknown  . Morphine And Related Other (See Comments)    Pt had severe respiratory depression and excessive sedation during a recent hospitalization and was told it was 2/2 morphine.     REVIEW OF SYSTEMS:  GENERAL: no change in appetite, no fatigue, no weight changes, no fever, chills or weakness RESPIRATORY: no cough, SOB, DOE, wheezing, hemoptysis CARDIAC: no chest pain, edema or palpitations GI: no abdominal pain, diarrhea, constipation, heart burn, nausea or vomiting  PHYSICAL EXAMINATION  GENERAL: no acute distress, normal body habitus EYES: conjunctivae normal, sclerae normal, normal eye lids NECK: supple, trachea midline, no neck masses, no thyroid tenderness, no thyromegaly LYMPHATICS: no LAN in the neck, no supraclavicular LAN RESPIRATORY: breathing is even & unlabored, BS CTAB CARDIAC: RRR, no murmur,no extra heart sounds, no edema GI:  abdomen soft, normal BS, no masses, no tenderness, no hepatomegaly, no splenomegaly EXTREMITIES:  Able to move 4 extremities PSYCHIATRIC: the patient is alert & oriented to person, affect & behavior appropriate  LABS/RADIOLOGY: 07/24/14  WBC 6.4 hemoglobin 11.4 hematocrit 34.8 MCV 102.4 sodium 139 potassium 4.5 glucose 87 BUN 19 creatinine 0.61 alkaline phosphatase 154 SGOT 18 SGPT 10  total protein 6.0 albumin Green 3.7 calcium 9.1 07/17/14  WBC 5.8 hemoglobin 10.3 hematocrit 31.6 MCV 103.3 07/10/14  WBC 15.9 hemoglobin 12.4 hematocrit 37.4 MCV 101.6 07/10/14  WBC 15.9 hemoglobin 12.4 hematocrit 37.4 MCV 101.6 Labs reviewed: Basic Metabolic Panel:  Recent Labs  06/26/14 0355 06/27/14 0536 06/28/14 0500  NA 139 137 134*  K 3.8 3.6 3.8  CL 101 98 98  CO2 31 29 29   GLUCOSE 94 104* 128*  BUN 13 7 8   CREATININE 0.50 0.40* 0.34*  CALCIUM 9.2 8.3* 8.3*   Liver Function Tests:  Recent Labs  06/26/14 0355  AST 25  ALT 16  ALKPHOS 42  BILITOT 0.5  PROT 6.7  ALBUMIN 4.1   CBC:  Recent Labs  08/27/13 1448 06/26/14 0355  06/28/14 0500 06/29/14 0540 06/30/14 0513  WBC 7.0 8.1  < > 7.7 8.1 7.4  NEUTROABS 4.0 4.8  --   --   --   --   HGB 13.9 12.4  < > 8.4* 7.8* 9.0*  HCT 40.9 38.8  < > 23.9* 23.4* 26.6*  MCV 104.4* 108.1*  < > 104.4* 104.9* 100.4*  PLT 178.0 197  < > 126* 134* 165  < > = values in this interval not displayed.  Lipid Panel:  Recent Labs  11/28/13 1004  HDL 55.90   Cardiac Enzymes:  Recent Labs  06/26/14 0355  TROPONINI <0.03   CBG:  Recent Labs  06/29/14 1704 06/30/14 0713 06/30/14 1211  GLUCAP 134* 97 138*    Dg Chest 1 View  06/26/2014   CLINICAL DATA:  Preoperative chest radiograph for hip fracture. Initial encounter.  EXAM: CHEST  1 VIEW  COMPARISON:  Chest radiograph from 01/21/2013  FINDINGS: The lungs are well-aerated and clear. There is no evidence of focal opacification, pleural effusion or pneumothorax.  The cardiomediastinal silhouette is within normal limits. No acute osseous abnormalities are seen. Mild degenerative change is noted at the lower lumbar spine. The patient is status post vertebroplasty of the upper and mid to lower thoracic spine.  IMPRESSION: No acute cardiopulmonary process seen; no displaced rib fractures identified.   Electronically Signed   By: Garald Balding M.D.   On: 06/26/2014 04:39   Ct  Head Wo Contrast  06/26/2014   CLINICAL DATA:  Found down in bathroom besides toilet. No loss of consciousness.  EXAM: CT HEAD WITHOUT CONTRAST  TECHNIQUE: Contiguous axial images were obtained from the base of the skull through the vertex without intravenous contrast.  COMPARISON:  CT of the head Sep 16, 2009  FINDINGS: The ventricles and sulci are normal for age. No intraparenchymal hemorrhage, mass effect nor midline shift. Patchy supratentorial white matter hypodensities are within normal range for patient's age and though non-specific suggest sequelae of chronic small vessel ischemic disease. No acute large vascular territory infarcts.  No abnormal extra-axial fluid collections. Basal cisterns are patent. Moderate calcific atherosclerosis of the carotid siphons.  No skull fracture. Patient is osteopenic. The included ocular globes and orbital contents are non-suspicious. 2 mm calcification inferior ocular globe, new punctate callus low location along RIGHT scleral, recommend correlation with ophthalmological examination.  The mastoid aircells and included paranasal sinuses are well-aerated.  IMPRESSION: No acute intracranial process.  Involutional changes. Mild to moderate white matter changes can be seen with chronic small vessel ischemic disease.   Electronically Signed   By: Elon Alas   On: 06/26/2014 04:58   Dg C-arm 1-60 Min-no Report  07/01/2014   : Fluoroscopy was utilized by the requesting physician. No radiographic interpretation.   Electronically Signed   By: Porfirio Mylar   On: 07/01/2014 08:50   Dg Hip Operative Unilat With Pelvis Right  06/26/2014   CLINICAL DATA:  ORIF right hip  EXAM: OPERATIVE RIGHT HIP 2 VIEWS  TECHNIQUE: Fluoroscopic spot image(s) were submitted for interpretation post-operatively.  COMPARISON:  None.  FLUOROSCOPY TIME:  1 minutes 10 seconds  FINDINGS: Two fluoroscopic spot images of the right hip were submitted status post gamma nail with dynamic hip screw  fixation of a comminuted intertrochanteric right hip fracture.  Fracture fragments are in near anatomic alignment and position.  IMPRESSION: Intraoperative fluoroscopic spot images during ORIF of a right hip fracture, as above.   Electronically Signed   By: Julian Hy M.D.   On: 06/26/2014 21:05   Dg Hip Unilat With Pelvis 2-3 Views Right  06/26/2014   CLINICAL DATA:  Status post fall; right hip deformity. Initial encounter.  EXAM: RIGHT HIP (WITH PELVIS) 2-3 VIEWS  COMPARISON:  None.  FINDINGS: There is a comminuted and displaced intertrochanteric fracture of the proximal right femur, with significant shortening at the fracture site, and marked angulation of a greater trochanteric fragment. The right femoral head remains seated at the acetabulum.  No additional fractures are seen. There is chronic deformity involving the left superior and inferior pubic rami. The left hip joint is grossly unremarkable. Mild degenerative change is noted at the lower lumbar spine. The sacroiliac joints are grossly unremarkable in appearance.  The visualized bowel gas pattern is grossly unremarkable.  IMPRESSION: Comminuted and displaced intertrochanteric fracture of the proximal right femur, with significant shortening at the fracture site, and marked angulation of a greater trochanteric fragment.   Electronically Signed   By: Garald Balding M.D.   On: 06/26/2014 04:40    ASSESSMENT/PLAN:  Right hip fracture  S/P intramedullary nail repair - for home health PT, OT, nursing and home health aide; continue Fentanyl 50 mcg/hr patch transdermally Q 72 hours and Norco 5/325 mg 1 tab PO QID PRN Diabetes mellitus, type II - hemoglobin A1c 5.9; Glucophage was recently discontinued Hypertension - recently decreased Lopressor to 12.5 mg by mouth every evening, continue lisinopril 20 mg 1 tab by mouth daily and verapamil 180 mg ER 1 tab by mouth twice a day Dementia - stable; continue Namenda XR 14 mg by mouth daily Depression  - continue Paxil 40 mg 1 tab by mouth every morning Constipation - continue MiraLAX 17 g last 4-6 ounces liquid by mouth daily Hyperlipidemia - continue Lipitor 10 mg by mouth daily Overactive bladder - continue Detrol LA 4 mg 1 capsule by mouth daily Chronic atrial fibrillation - continue verapamil 180 mg ER 1 tab by mouth twice a day; High fall risk so anticoagulation was not recommended. Insomnia - continue Restoril 30 mg by mouth daily at bedtime when necessary Anxiety - continue Valium 5 mg 1 tab by mouth every 6 hours when necessary    I have filled out patient's discharge paperwork and written prescriptions.  Patient will receive home health PT, OT, Nursing and home health aide.  Total  discharge time: Greater than 30 minutes  Discharge time involved coordination of the discharge process with Education officer, museum, nursing staff and therapy department. Medical justification for home health services verified.     [facility], NP [employer] [phone]

## 2014-08-25 ENCOUNTER — Telehealth: Payer: Self-pay | Admitting: Internal Medicine

## 2014-08-25 NOTE — Telephone Encounter (Signed)
Spoke with patient who states she received a call from her PCP's office that there is an interaction between her Metoprolol and Verapamil.  She states she questioned what the interaction is and the person calling did not know but would call her back.  Patient then called her pharmacy and was told yes there is an interaction and to call cardiology for clarification.  Patient reports she takes Verapamil 180 mg bid and Lopressor 12.5 mg once daily. I advised patient that therapy for Lopressor is twice daily.  Patient states she was not aware of this.   During the conversation, the patient's husband is yelling from the background about her medications; he states she is giving me incorrect information and patient is asking him to stop yelling and to go away.  According to patient's dpr from 2014, I cannot speak to patient's husband.  Patient states some of her medications were changed when she was at [facility] recovering from hip fracture.  I advised patient that I will have to discuss with Dr. Harrington Challenger, office DOD, because Dr. Caryl Comes is not in the office.  I advised patient I will call her back and she verbalized understanding and agreement.  Patient asked that I don't call between 12-12:45 when she will have physical therapy.

## 2014-08-25 NOTE — Telephone Encounter (Signed)
New Message  Pt needed to speak w/ Rn- After seeing PCP- pt wants to speak w/ Rn about Metoprolol and whether it is working against her verapamiline (which was reduced after hip replacement). Please call back and discuss.

## 2014-08-25 NOTE — Telephone Encounter (Signed)
I reviewed with Dr. Harrington Challenger who advised patient continue current medications, states interaction is not relevant to patient's therapy, and schedule follow up as previously directed.  Patient was instructed 8/15 to follow-up in 6 months with Dr. Caryl Comes.  I called patient and reviewed Dr. Alan Ripper advice; patient verbalized understanding and agreement and is scheduled for appointment with Ermalinda Barrios, PA-C on 5/9.  Patient thanked me for my help.

## 2014-08-29 ENCOUNTER — Telehealth: Payer: Self-pay | Admitting: *Deleted

## 2014-08-29 NOTE — Telephone Encounter (Signed)
[place] Night - Client TELEPHONE ADVICE RECORD [facility] Patient Name: [name] Gender: Female DOB: 06/20/1932 Age: 81 Y 3 M 16 D Return Phone Number: [phone] (Primary) Address: [address] [place] City/State/Zip: [place] Client [facility] Night - Client Client Site [place] - Night Physician [place], [place] Type Call Call Type Triage / Clinical Caller Name [name] Relationship To Patient Self Return Phone Number [phone] (Primary) Chief Complaint BREATHING - shortness of breath or sounds breathless Initial Comment Caller states her heart is racing and is very dizzy. Experiencing shortness of breath. Medication has recently been changed. PreDisposition Did not know what to do Nurse Assessment Nurse: Harlow Mares, RN, Suanne Marker Date/Time (Eastern Time): 08/23/2014 11:09:41 AM Confirm and document reason for call. If symptomatic, describe symptoms. ---Caller states her heart is racing and is very dizzy. Denies SOB. Medication has recently been changed. Metropropol 12.5 mg is what the MD discontinued and was started on a medication that starts with a P. She is unsure of the name. Reports that she takes 180 daily. Reports that she has a racing heart upon awakening. Reports that her meds were changed when she was in rehab during a hip rehab. Reports that her diastolic was high and the reason for the change. She reports that she has NA's in the home and her BP this week is 130/80. Has the patient traveled out of the country within the last 30 days? ---No Does the patient require triage? ---Yes Related visit to physician within the last 2 weeks? ---Yes Saw orthopedic surgeon on Thursday and was d/c'd. Does the PT have any chronic conditions? (i.e. diabetes, asthma, etc.) ---Yes List chronic conditions. ---hypertension; diabetes; Guidelines Guideline Title Affirmed Question Affirmed Notes Nurse Date/Time  (Eastern Time) Dizziness - Lightheadedness Extra heart beats OR irregular heart beating (i.e., "palpitations") Harlow Mares, RN, Suanne Marker 08/23/2014 11:18:18 AM Disp. Time Eilene Ghazi Time) Disposition Final User 08/23/2014 11:08:21 AM Send to Urgent [place], [place] PLEASE NOTE: All timestamps contained within this report are represented as [place] Standard Time. CONFIDENTIALTY NOTICE: This fax transmission is intended only for the addressee. It contains information that is legally privileged, confidential or otherwise protected from use or disclosure. If you are not the intended recipient, you are strictly prohibited from reviewing, disclosing, copying using or disseminating any of this information or taking any action in reliance on or regarding this information. If you have received this fax in error, please notify us immediately by telephone so that we can arrange for its return to [place]. Phone: [phone], Toll-Free: [phone], Fax: [phone] Page: 2 of 2 Call Id: [id number] 08/23/2014 11:21:01 AM Go to ED Now (or PCP triage) Yes Harlow Mares, RN, Rosalyn Charters Understands: Yes Disagree/Comply: Disagree Disagree/Comply Reason: Unable to find transportation Care Advice Given Per Guideline GO TO ED NOW (OR PCP TRIAGE): * IF NO PCP TRIAGE: You need to be seen. Go to the [facility] at [facility] within the next hour. Leave as soon as you can. DRIVING: Another adult should drive. BRING MEDICINES: * Please bring a list of your current medicines when you go to the Emergency Department (ER). * It is also a good idea to bring the pill bottles too. This will help the doctor to make certain you are taking the right medicines and the right dose. CARE ADVICE given per Dizziness (Adult) guideline. After Care Instructions Given

## 2014-09-02 ENCOUNTER — Encounter: Payer: Self-pay | Admitting: Physical Medicine & Rehabilitation

## 2014-09-17 ENCOUNTER — Other Ambulatory Visit: Payer: Self-pay

## 2014-09-17 MED ORDER — PRAVASTATIN SODIUM 80 MG PO TABS: 80.0000 mg | ORAL_TABLET | Freq: Every day | ORAL | Status: DC

## 2014-09-22 ENCOUNTER — Ambulatory Visit: Payer: Medicare Other | Admitting: Physician Assistant

## 2014-09-30 ENCOUNTER — Ambulatory Visit: Payer: Medicare Other | Admitting: Physical Medicine & Rehabilitation

## 2014-10-01 ENCOUNTER — Ambulatory Visit: Payer: Medicare Other | Admitting: Nurse Practitioner

## 2014-10-03 ENCOUNTER — Ambulatory Visit: Payer: Medicare Other | Admitting: Physical Medicine & Rehabilitation

## 2014-10-08 ENCOUNTER — Other Ambulatory Visit: Payer: Self-pay | Admitting: Internal Medicine

## 2014-10-09 ENCOUNTER — Ambulatory Visit (HOSPITAL_BASED_OUTPATIENT_CLINIC_OR_DEPARTMENT_OTHER): Payer: Medicare Other | Admitting: Physical Medicine & Rehabilitation

## 2014-10-09 ENCOUNTER — Encounter: Payer: Self-pay | Admitting: Physical Medicine & Rehabilitation

## 2014-10-09 ENCOUNTER — Other Ambulatory Visit: Payer: Self-pay | Admitting: Physical Medicine & Rehabilitation

## 2014-10-09 ENCOUNTER — Encounter: Payer: Medicare Other | Attending: Physical Medicine & Rehabilitation

## 2014-10-09 VITALS — BP 123/62 | HR 58 | Resp 14

## 2014-10-09 DIAGNOSIS — M2010 Hallux valgus (acquired), unspecified foot: Secondary | ICD-10-CM | POA: Insufficient documentation

## 2014-10-09 DIAGNOSIS — E1142 Type 2 diabetes mellitus with diabetic polyneuropathy: Secondary | ICD-10-CM | POA: Insufficient documentation

## 2014-10-09 DIAGNOSIS — M4806 Spinal stenosis, lumbar region: Secondary | ICD-10-CM

## 2014-10-09 DIAGNOSIS — G894 Chronic pain syndrome: Secondary | ICD-10-CM | POA: Insufficient documentation

## 2014-10-09 DIAGNOSIS — Z5181 Encounter for therapeutic drug level monitoring: Secondary | ICD-10-CM

## 2014-10-09 DIAGNOSIS — M5030 Other cervical disc degeneration, unspecified cervical region: Secondary | ICD-10-CM | POA: Insufficient documentation

## 2014-10-09 DIAGNOSIS — Z79899 Other long term (current) drug therapy: Secondary | ICD-10-CM | POA: Insufficient documentation

## 2014-10-09 DIAGNOSIS — M48061 Spinal stenosis, lumbar region without neurogenic claudication: Secondary | ICD-10-CM

## 2014-10-09 NOTE — Progress Notes (Signed)
Subjective:    Patient ID: [name], female    DOB: 04/09/1933, 81 y.o.   MRN: [id number]  HPI 81 year old female with history of cervical thoracic and lumbar stenosis and chronic spine pain. Had received in the past that no patch 50 g . Had a fall in February sustaining a right femur fracture on 06/26/2014, underwent IM nail by Dr. Ihor Gully on that same date. Postoperatively went to a skilled nursing facility until March 24, received hydrocodone in addition to the fentanyl patch. This caused some mental status changes and after patient was discharged to her home she stopped taking the hydrocodone with improvements of her mental status. Patient has been receiving home health services up until last week. Her husband has been assisting her with her self-care but now is able to dress and bathe and shower herself.   Patient also has been treated for dementia with Namenda and then switched to Namenda XR. This has been over the last 18-24 months  Pain Inventory Average Pain 6 Pain Right Now 5 My pain is constant and aching  In the last 24 hours, has pain interfered with the following? General activity 7 Relation with others 0 Enjoyment of life 10 What TIME of day is your pain at its worst? all Sleep (in general) varies  Pain is worse with: walking, bending, sitting, inactivity, standing and some activites Pain improves with: medication Relief from Meds: 6  Mobility walk with assistance use a walker how many minutes can you walk? very little ability to climb steps?  yes do you drive?  no  Function retired I need assistance with the following:  meal prep, household duties and shopping  Neuro/Psych weakness numbness tingling trouble walking confusion suicidal thoughts - No Active plans  Prior Studies new visit Clinical Data: New compression fractures of the thoracic spine.   MRI THORACIC SPINE WITHOUT CONTRAST   Technique:  Multiplanar and multiecho pulse sequences of  the thoracic spine were obtained without intravenous contrast.   Comparison: Chest x-ray dated 08/25/2010   Findings: The scan extends from the lower cervical spine through L2- 3.  There are acute compression fractures at T5 and T10.  There is also a subacute or old mild compression fracture of the superior plate of T3.   There is edema in and around the T5 and T10 vertebral bodies. There is a small central disc protrusion at T9-10 which touches the ventral aspect of the spinal cord but does not compress it.  There is no protrusion of bone into the spinal canal.   There are tiny disc protrusions central and slightly to the right at T7-8 and T8-9.   There is severe degenerative disc disease in the lower cervical spine.   IMPRESSION:   1.  Benign-appearing acute compression fractures of T5 and T10. No significant impingement upon the spinal canal. 2.  Subacute or old mild compression fracture of the superior plate of T3. 3.  Small disc protrusion centrally at T9-10 without significant impingement upon the spinal cord or nerve roots.   Per CMS PQRS reporting requirements (PQRS Measure 24): Given the patient's age of greater than 22 and the fracture site (hip, distal radius, or spine), the patient should be tested for osteoporosis using DXA, and the appropriate treatment considered based on the DXA results.   Original Report Authenticated By: Larey Seat, M.D. Clinical Data: Abdomen pain.  Compression fractures of the thoracic spine seen on chest x-ray dated 08/25/2010   MRI LUMBAR SPINE WITHOUT  CONTRAST   Technique:  Multiplanar and multiecho pulse sequences of the lumbar spine were obtained without intravenous contrast.   Comparison: Chest x-ray dated 08/25/2010   Findings: The scan extends from T11-12 through S4.  The tip of the conus is at L2 and appears normal.   T11-12 and T12-L1:  Normal.   L1-2:  There is marked narrowing of the disc space with  broad-based spurring from the endplates without significant impingement.   L2-3:  There is marked narrowing of the disc space with broad-based spurs with small disc protrusions into the neural foramina bilaterally without impingement upon the exiting nerve roots. There is mild spinal stenosis.   L3-4:  There is marked disc space narrowing with broad-based endplate spurring asymmetric to the right with moderate spinal stenosis.  There is prominent epidural fat which contributes to the compression of the thecal sac.   L4-5:  Disc space narrowing with broad-based bulging and endplate spurring causing marked compression of the thecal sac in combination with the epidural fat.  No focal nerve root impingement.   L5-L1:  The disc space is markedly narrowed.  There is a large calcified disc extrusion extending inferior to the disc space in the left lateral recess.  This measures 15 x 13 x 9 mm in size and markedly compresses the left S1 nerve root.  The thecal sac is compressed by the epidural fat and spurs.   IMPRESSION:  1.  No acute abnormalities. 2.  Severe degenerative disc disease from L1-2 through L5 S1. 3.  Marked compression of the thecal sac at L4-5 due to a combination of prominent epidural fat and spurring from the vertebral endplates.  Mild spinal stenosis at L2-3 and moderate spinal stenosis at L3-4 and  L5 S1.  Physicians involved in your care Any changes since last visit?  no Primary care Dr. Carol Ada Orthopedist Dr. Paralee Cancel new visit   Family History  Problem Relation Age of Onset  . Heart disease Mother   . Cancer Father     throat  . Atrial fibrillation Sister    History   Social History  . Marital Status: Married    Spouse Name: N/A  . Number of Children: N/A  . Years of Education: N/A   Social History Main Topics  . Smoking status: Current Every Day Smoker -- 0.50 packs/day for 65 years    Types: Cigarettes  . Smokeless tobacco: Never  Used  . Alcohol Use: Yes     Comment: Occasional  . Drug Use: No  . Sexual Activity: No   Other Topics Concern  . None   Social History Narrative   Past Surgical History  Procedure Laterality Date  . Right ankle surgery    . Knee arthroscopy    . Intramedullary (im) nail intertrochanteric Right 06/26/2014    Procedure: INTRAMEDULLARY (IM) NAIL INTERTROCHANTRIC;  Surgeon: Mauri Pole, MD;  Location: [facility];  Service: Orthopedics;  Laterality: Right;   Past Medical History  Diagnosis Date  . Paroxysmal atrial fibrillation     chronic anticoag until 01/2013 - stopped due to high fall risk  . Right bundle branch block and incomplete left bundle branch block     with left axis deviation  . Chronic back pain     narcotic dependence  . Lumbar disc disease     dr. Trenton Gammon  . Mitral valve prolapse   . Macular degeneration   . Gastritis     RUT negative, 2005  .  Diverticulosis   . Alcohol abuse   . Compression fracture 02/2011    s/p KP  . Arthritis   . Depression   . Myocardial infarction   . COPD (chronic obstructive pulmonary disease)   . Intertrochanteric fracture of right hip 06/26/2014  . ANXIETY DEPRESSION 10/01/2009    Qualifier: Diagnosis of  By: Niel Hummer MD, Lorinda Creed   . Dementia 01/28/2013  . Diabetes mellitus without complication 92/44/6286  . Fall 01/21/2013  . HLD (hyperlipidemia) 05/30/2007    Qualifier: Diagnosis of  By: Niel Hummer MD, [place] Hypertension 02/20/2013  . Insomnia 01/28/2013  . Protein-calorie malnutrition, severe 06/26/2014  . Weight loss, unintentional 02/08/2013  . VITAMIN D DEFICIENCY 03/20/2008    Qualifier: Diagnosis of  By: Cori Razor RN, Mikal Plane Tobacco abuse 06/26/2014   BP 123/62 mmHg  Pulse 58  Resp 14  SpO2 95%  Opioid Risk Score: 0 Fall Risk Score: Moderate Fall Risk (6-13 points)`1  Depression screen PHQ 2/9  Depression screen PHQ 2/9 10/09/2014  Decreased Interest 0  Down, Depressed, Hopeless 3  PHQ - 2 Score 3   Altered sleeping 0  Tired, decreased energy 2  Change in appetite 0  Feeling bad or failure about yourself  1  Trouble concentrating 0  Moving slowly or fidgety/restless 3  Suicidal thoughts 0  PHQ-9 Score 9     Review of Systems  Constitutional:       Weight loss  Musculoskeletal: Positive for gait problem.  Neurological: Positive for weakness and numbness.       Tingling  Psychiatric/Behavioral: Positive for suicidal ideas and confusion.  All other systems reviewed and are negative.      Objective:   Physical Exam  Constitutional: She appears well-developed.  Thin, chronically ill-appearing  HENT:  Head: Normocephalic and atraumatic.  Eyes: Conjunctivae and EOM are normal. Pupils are equal, round, and reactive to light.  Musculoskeletal:       Right knee: Normal.       Left knee: Normal.       Cervical back: She exhibits decreased range of motion, deformity and pain.       Thoracic back: She exhibits decreased range of motion and deformity.       Lumbar back: She exhibits decreased range of motion. She exhibits no tenderness and no spasm.  Neurological: She is alert.  Psychiatric: Her speech is delayed. Cognition and memory are impaired.  Husband needs to fill in the details of medical history including physician names, approximate dates, Medication doses  Nursing note and vitals reviewed.  Decreased sensation to pinprick up to the knee On the right and above the patella on the left Feet show severe valgus deformity left greater than right great toe Cervical spine range of motion 100% flexion 50% extension 75% lateral bending and rotation Shoulder range of motion 100% internal and external rotation as well as abduction Kyphotic posture     Assessment & Plan:  1. Severe cervical and lumbar degenerative disc with chronic spine pain. This appears to be severe enough to warrant narcotic analgesics on a chronic basis. She has had good relief with fentanyl patch 50 g to  72 hours. She's had no severe side effects. She has not tried 25 g however when she went up to 75 g she was overly sedated. Would recommend trial of 25 g patch If she tolerates this well may consider change to Bu trans-patch 20 g  Patient may benefit  from lumbar medial branch blocks positioning on the table may be difficult due to severe kyphosis  2. Lumbar spinal stenosis with radiological evidence of nerve root compression left S1. Patient does not have any complaints in this area, just feels a little weak. She does have severe diabetic neuropathy which may be masking symptoms  3. Diabetic neuropathy severe, This is not severely painful, mainly just numb. She is doing a good job with foot hygiene, assisted by husband, Would not start gabapentin at this point   4. Kyphoscoliosis cervical thoracic area, severe with chronic compression fractures T5 and T10

## 2014-10-09 NOTE — Patient Instructions (Addendum)
We spoke about alternatives to fentanyl patch including Butrans patch, This would allow for less Frequent clinic visits.  I would advise going down to the 25 g fentanyl for couple weeks before trying a BuTrans patch   I'm not sure about her insurance coverage for  Butrans medication you may want to check in on that.

## 2014-10-10 LAB — PMP ALCOHOL METABOLITE (ETG): ETGU: NEGATIVE ng/mL

## 2014-10-14 LAB — FENTANYL (GC/LC/MS), URINE
Fentanyl, confirm: 18.9 ng/mL (ref <0.5)
Norfentanyl, confirm: 182.1 ng/mL (ref <0.5)

## 2014-10-14 LAB — BENZODIAZEPINES (GC/LC/MS), URINE
ALPRAZOLAMU: NEGATIVE ng/mL (ref <25)
Clonazepam metabolite (GC/LC/MS), ur confirm: NEGATIVE ng/mL (ref <25)
FLURAZEPAMU: NEGATIVE ng/mL (ref <50)
Lorazepam (GC/LC/MS), ur confirm: NEGATIVE ng/mL (ref <50)
Midazolam (GC/LC/MS), ur confirm: NEGATIVE ng/mL (ref <50)
NORDIAZEPAMU: 142 ng/mL — AB (ref <50)
OXAZEPAMU: 2028 ng/mL — AB (ref <50)
Temazepam (GC/LC/MS), ur confirm: 16344 ng/mL — AB (ref <50)
Triazolam metabolite (GC/LC/MS), ur confirm: NEGATIVE ng/mL (ref <50)

## 2014-10-15 LAB — PRESCRIPTION MONITORING PROFILE (SOLSTAS)
AMPHETAMINE/METH: NEGATIVE ng/mL
BARBITURATE SCREEN, URINE: NEGATIVE ng/mL
BUPRENORPHINE, URINE: NEGATIVE ng/mL
CANNABINOID SCRN UR: NEGATIVE ng/mL
CREATININE, URINE: 59.27 mg/dL (ref >20.0)
Carisoprodol, Urine: NEGATIVE ng/mL
Cocaine Metabolites: NEGATIVE ng/mL
ECSTASY: NEGATIVE ng/mL
MEPERIDINE UR: NEGATIVE ng/mL
Methadone Screen, Urine: NEGATIVE ng/mL
Nitrites, Initial: NEGATIVE ug/mL
Opiate Screen, Urine: NEGATIVE ng/mL
Oxycodone Screen, Ur: NEGATIVE ng/mL
Propoxyphene: NEGATIVE ng/mL
TRAMADOL UR: NEGATIVE ng/mL
Tapentadol, urine: NEGATIVE ng/mL
Zolpidem, Urine: NEGATIVE ng/mL
pH, Initial: 6.9 pH (ref 4.5–8.9)

## 2014-10-21 NOTE — Progress Notes (Signed)
Urine drug screen for this encounter is consistent for prescribed medication.  She has been prescribed diazepam which is present.

## 2014-10-22 ENCOUNTER — Other Ambulatory Visit: Payer: Self-pay

## 2014-10-22 MED ORDER — METOPROLOL TARTRATE 25 MG PO TABS: ORAL_TABLET | ORAL | Status: DC

## 2014-10-22 NOTE — Telephone Encounter (Signed)
Imogene Burn, PA-C at 12/18/2013 12:53 PM  metoprolol tartrate (LOPRESSOR) 25 MG tablet, TAKE 1/2 TAB BY MOUTH TWICE DAILY Atrial fibrillation - Imogene Burn, PA-C at 12/18/2013 1:24 PM     Status: Written Related Problem: Atrial fibrillation   Expand All Collapse All   Patient is in normal sinus rhythm today, rate controlled on metoprolol. No anticoagulation because of recurrent falls. Followup with Dr. Caryl Comes in 6 months

## 2014-11-03 ENCOUNTER — Telehealth: Payer: Self-pay | Admitting: *Deleted

## 2014-11-03 ENCOUNTER — Other Ambulatory Visit: Payer: Self-pay | Admitting: *Deleted

## 2014-11-03 MED ORDER — FENTANYL 25 MCG/HR TD PT72: 25.0000 ug | MEDICATED_PATCH | TRANSDERMAL | Status: DC

## 2014-11-03 NOTE — Telephone Encounter (Signed)
Per Dr. Letta Pate - ok to pick up RX for Fentanyl 25 mcg #10 apply one patches every 73 hours (3 days).  No refills - patient has appt on 6/24.

## 2014-11-03 NOTE — Telephone Encounter (Signed)
rx was printed out and waiting for DR to sign

## 2014-11-03 NOTE — Telephone Encounter (Signed)
Mr. [name] called, they are still awaiting a phone call regarding pt's UDS. He said that during the initial visit with you on 10/09/2014 you had spoke of prescribing  A step down Rx of the fentanyl patch that the pt has been using in the past. He says Miss [name]'s completely out of medication and is wondering what you can do to help. She has an up coming appt on 11/07/2014 with you

## 2014-11-03 NOTE — Telephone Encounter (Signed)
fentanyl script printed, awaiting signature, called pt to come pick up

## 2014-11-03 NOTE — Telephone Encounter (Signed)
May have pt pick up rx for fentanyl 4mcg  Change q 72H, #10

## 2014-11-05 NOTE — Telephone Encounter (Signed)
That's fine but she'll need to see Zella Ball since I'm booked

## 2014-11-05 NOTE — Telephone Encounter (Signed)
Pt rescheduled for July 18th w/ Dr. Letta Pate

## 2014-11-05 NOTE — Telephone Encounter (Signed)
Mr. Martindelcampo called on pt's behalf.  He says miss woods has been wearing the 'step down' fentanyl patches for a couple of days.  He is concerned that by coming to the next appt on Friday June 24th that not enough time would have lapsed to effectively assess how well the lower dose patches are working.  He is wondering if they should reschedule 3 weeks out to give more time to monitor the patches effectiveness. Please advise

## 2014-11-07 ENCOUNTER — Ambulatory Visit: Payer: Medicare Other | Admitting: Physical Medicine & Rehabilitation

## 2014-11-07 ENCOUNTER — Ambulatory Visit: Payer: Medicare Other

## 2014-11-12 ENCOUNTER — Encounter: Payer: Self-pay | Admitting: Internal Medicine

## 2014-11-14 ENCOUNTER — Ambulatory Visit (INDEPENDENT_AMBULATORY_CARE_PROVIDER_SITE_OTHER): Payer: Medicare Other | Admitting: Nurse Practitioner

## 2014-11-14 ENCOUNTER — Encounter: Payer: Self-pay | Admitting: Nurse Practitioner

## 2014-11-14 VITALS — BP 98/58 | HR 62 | Ht 65.0 in | Wt 99.0 lb

## 2014-11-14 DIAGNOSIS — R002 Palpitations: Secondary | ICD-10-CM | POA: Diagnosis not present

## 2014-11-14 DIAGNOSIS — E785 Hyperlipidemia, unspecified: Secondary | ICD-10-CM | POA: Diagnosis not present

## 2014-11-14 DIAGNOSIS — I480 Paroxysmal atrial fibrillation: Secondary | ICD-10-CM | POA: Diagnosis not present

## 2014-11-14 DIAGNOSIS — I10 Essential (primary) hypertension: Secondary | ICD-10-CM | POA: Diagnosis not present

## 2014-11-14 MED ORDER — METOPROLOL TARTRATE 25 MG PO TABS: ORAL_TABLET | ORAL | Status: DC

## 2014-11-14 NOTE — Progress Notes (Signed)
CARDIOLOGY OFFICE NOTE  Date:  11/14/2014    [name] Date of Birth: 1932-10-18 Medical Record #[id number]  PCP:  Toni Naas, MD  Cardiologist:  [name]    Chief Complaint  Patient presents with  . Palpitations    Follow up visit - seen for Dr. Caryl Ibarra.     History of Present Illness: [name] is a 81 y.o. female who presents today for a follow up visit. Seen for Dr. Caryl Ibarra. She has a history of paroxysmal atrial fibrillation, hypertension, diabetes mellitus, hyperlipidemia, and mitral valve disease. 2-D echo in 2011 EF 55-60% was severely calcified mitral valve with restricted motion of the posterior leaflet mean gradient 2, peak gradient 7, mild MR. She had been maintained on Coumadin in the past until last September of 2014 when she had a fall in the setting of his super therapeutic INR. It was decided to stop anticoagulation due to high fall risk.  She was last seen here back in August. Chronic dizziness noted. Continuing to fall.   Ibarra in today. Here with her husband. He is managing her medicines due to her poor eyesight. She fell and broke her hip back in February. Went to SNF for rehab. On narcotics. Her PCP sent her to the pain clinic due to chronic narcotic use. Has ongoing tobacco and alcohol use. She has had 3 spells of "rapid heart beat". Hard to really define. She has increased her dose of metoprolol on her own. Now running out of pills. Only on aspirin and she is asking why she is only on aspirin. No chest pain. Breathing ok. She is very sedentary. BP running low. She is not passing out. She does endorse dizziness. She is on chronic narcotics, chronic valium and sleeping medicines.   Past Medical History  Diagnosis Date  . Paroxysmal atrial fibrillation     chronic anticoag until 01/2013 - stopped due to high fall risk  . Right bundle branch block and incomplete left bundle branch block     with left axis deviation  . Chronic back pain     narcotic  dependence  . Lumbar disc disease     dr. Trenton Ibarra  . Mitral valve prolapse   . Macular degeneration   . Gastritis     RUT negative, 2005  . Diverticulosis   . Alcohol abuse   . Compression fracture 02/2011    s/p KP  . Arthritis   . Depression   . Myocardial infarction   . COPD (chronic obstructive pulmonary disease)   . Intertrochanteric fracture of right hip 06/26/2014  . ANXIETY DEPRESSION 10/01/2009    Qualifier: Diagnosis of  By: [name]   . Dementia 01/28/2013  . Diabetes mellitus without complication 42/59/5638  . Fall 01/21/2013  . HLD (hyperlipidemia) 05/30/2007    Qualifier: Diagnosis of  By: Toni Hummer MD, [place] Hypertension 02/20/2013  . Insomnia 01/28/2013  . Protein-calorie malnutrition, severe 06/26/2014  . Weight loss, unintentional 02/08/2013  . VITAMIN D DEFICIENCY 03/20/2008    Qualifier: Diagnosis of  By: Toni Razor RN, [name] Tobacco abuse 06/26/2014    Past Surgical History  Procedure Laterality Date  . Right ankle surgery    . Knee arthroscopy    . Intramedullary (im) nail intertrochanteric Right 06/26/2014    Procedure: INTRAMEDULLARY (IM) NAIL INTERTROCHANTRIC;  Surgeon: Toni Pole, MD;  Location: [facility];  Service: Orthopedics;  Laterality: Right;     Medications:  Current Outpatient Prescriptions  Medication Sig Dispense Refill  . aspirin EC 81 MG tablet Take 1 tablet (81 mg total) by mouth daily. 150 tablet 2  . b complex vitamins tablet Take 1 tablet by mouth daily.    . Cholecalciferol (VITAMIN D) 1000 UNITS capsule Take 2,000 Units by mouth daily.     . diazepam (VALIUM) 5 MG tablet Take 1 tablet (5 mg total) by mouth every 6 (six) hours as needed. for anxiety 30 tablet 0  . fentaNYL (DURAGESIC - DOSED MCG/HR) 25 MCG/HR patch Place 1 patch (25 mcg total) onto the skin every 3 (three) days. 10 patch 0  . metFORMIN (GLUCOPHAGE) 500 MG tablet Take 1 tablet (500 mg total) by mouth daily with breakfast. 90 tablet 2  . metoprolol  tartrate (LOPRESSOR) 25 MG tablet TAKE 1/2 TABLET (12.5MG )  BY MOUTH TWICE DAILY (Patient taking differently: TAKE A 1/2 TABLET IN AM AND TABLET IN PM) 30 tablet 1  . Multiple Vitamin (MULTIVITAMIN WITH MINERALS) TABS tablet Take 1 tablet by mouth daily.    Marland Kitchen NAMENDA XR 14 MG CP24 TAKE 1 CAPSULE BY MOUTH DAILY. 30 capsule 11  . PARoxetine (PAXIL) 40 MG tablet TAKE 1 TABLET (40 MG TOTAL) BY MOUTH EVERY MORNING. 30 tablet 5  . polyethylene glycol powder (GLYCOLAX/MIRALAX) powder Take 17 g by mouth daily. 3350 g 1  . pravastatin (PRAVACHOL) 80 MG tablet Take 1 tablet (80 mg total) by mouth daily. 30 tablet 0  . ramipril (ALTACE) 5 MG capsule Take 1 capsule (5 mg total) by mouth daily. 30 capsule 11  . temazepam (RESTORIL) 30 MG capsule TAKE 1 CAPSULE(S) BY MOUTH AT BEDTIME AS NEEDED 30 capsule 5  . verapamil (CALAN-SR) 180 MG CR tablet TAKE ONE TABLET BY MOUTH TWICE DAILY 60 tablet 5   No current facility-administered medications for this visit.    Allergies: Allergies  Allergen Reactions  . Codeine Other (See Comments)    unknown  . Morphine And Related Other (See Comments)    Pt had severe respiratory depression and excessive sedation during a recent hospitalization and was told it was 2/2 morphine.    Social History: The patient  reports that she has been smoking Cigarettes.  She has a 32.5 pack-year smoking history. She has never used smokeless tobacco. She reports that she drinks alcohol. She reports that she does not use illicit drugs.   Family History: The patient's family history includes Atrial fibrillation in her sister; Cancer in her father; Heart disease in her mother.   Review of Systems: Please see the history of present illness.   Otherwise, the review of systems is positive for back pain, skipped heart beats.   All other systems are reviewed and negative.   Physical Exam: VS:  BP 98/58 mmHg  Pulse 62  Ht 5\' 5"  (1.651 m)  Wt 99 lb (44.906 kg)  BMI 16.47 kg/m2 .  BMI  Body mass index is 16.47 kg/(m^2).  Wt Readings from Last 3 Encounters:  11/14/14 99 lb (44.906 kg)  08/06/14 96 lb 12.8 oz (43.908 kg)  07/04/14 99 lb (44.906 kg)    General: She is quite thin. Looks chronically ill but in no acute distress. She is in a wheelchair.  HEENT: Normal. Neck: Supple, no JVD, carotid bruits, or masses noted.  Cardiac: Regular rate and rhythm. No murmurs, rubs, or gallops. No edema.  Respiratory:  Lungs are clear to auscultation bilaterally with normal work of breathing.  GI: Soft and nontender.  MS: No deformity or atrophy. Gait not tested. . Skin: Warm and dry. Color is normal.  Neuro:  Strength and sensation are intact and no gross focal deficits noted but looks to her husband for most of her answers today.  Psych: Alert, appropriate and with normal affect.   LABORATORY DATA:  EKG:  EKG is ordered today. This demonstrates NSR today.  Lab Results  Component Value Date   WBC 7.4 06/30/2014   HGB 9.0* 06/30/2014   HCT 26.6* 06/30/2014   PLT 165 06/30/2014   GLUCOSE 128* 06/28/2014   CHOL 116 11/28/2013   TRIG 116.0 11/28/2013   HDL 55.90 11/28/2013   LDLCALC 37 11/28/2013   ALT 16 06/26/2014   AST 25 06/26/2014   NA 134* 06/28/2014   K 3.8 06/28/2014   CL 98 06/28/2014   CREATININE 0.34* 06/28/2014   BUN 8 06/28/2014   CO2 29 06/28/2014   TSH 0.60 08/27/2013   INR 1.01 06/26/2014   HGBA1C 5.9 11/28/2013   MICROALBUR 0.1 SUPPR 01/09/2013    BNP (last 3 results) No results for input(s): BNP in the last 8760 hours.  ProBNP (last 3 results) No results for input(s): PROBNP in the last 8760 hours.   Other Studies Reviewed Today:   Assessment/Plan: 1. HTN - BP now low - I have stopped her ACE. I would like to see her BP trending higher.   2. Falls  3. Multi substance abuse   4. PAF - in NSR today - she is not a candidate for anticoagulation other than aspirin.  Current medicines are reviewed with the patient today.  The patient  does not have concerns regarding medicines other than what has been noted above.  The following changes have been made:  See above.  Labs/ tests ordered today include:   No orders of the defined types were placed in this encounter.     Disposition:   FU with Dr. Caryl Ibarra in 3 months. Labs are checked by PCP.   Patient is agreeable to this plan and will call if any problems develop in the interim.   Signed: Burtis Junes, RN, ANP-C 11/14/2014 12:25 PM  [facility] [address] [place], [place]  [address] Phone: [phone] Fax: [phone]

## 2014-11-14 NOTE — Patient Instructions (Addendum)
We will be checking the following labs today - NONE   Medication Instructions:    Continue with your current medicines but  I am stopping the Ramipril  You may continue the higher dose of metoprolol - this was refilled today.      Testing/Procedures To Be Arranged:  N/A  Follow-Up:   See Dr. Caryl Comes in 3 months    Other Special Instructions:   N/A  Call the [facility] office at [phone] if you have any questions, problems or concerns.

## 2014-11-19 ENCOUNTER — Other Ambulatory Visit: Payer: Self-pay | Admitting: Internal Medicine

## 2014-11-20 ENCOUNTER — Other Ambulatory Visit: Payer: Self-pay

## 2014-11-20 MED ORDER — PRAVASTATIN SODIUM 80 MG PO TABS: 80.0000 mg | ORAL_TABLET | Freq: Every day | ORAL | Status: DC

## 2014-12-01 ENCOUNTER — Encounter: Payer: Self-pay | Admitting: Physical Medicine & Rehabilitation

## 2014-12-01 ENCOUNTER — Encounter: Payer: Medicare Other | Attending: Physical Medicine & Rehabilitation

## 2014-12-01 ENCOUNTER — Ambulatory Visit (HOSPITAL_BASED_OUTPATIENT_CLINIC_OR_DEPARTMENT_OTHER): Payer: Medicare Other | Admitting: Physical Medicine & Rehabilitation

## 2014-12-01 VITALS — BP 104/51 | HR 45 | Resp 14

## 2014-12-01 DIAGNOSIS — M5136 Other intervertebral disc degeneration, lumbar region: Secondary | ICD-10-CM

## 2014-12-01 DIAGNOSIS — M5030 Other cervical disc degeneration, unspecified cervical region: Secondary | ICD-10-CM | POA: Insufficient documentation

## 2014-12-01 DIAGNOSIS — E1142 Type 2 diabetes mellitus with diabetic polyneuropathy: Secondary | ICD-10-CM | POA: Diagnosis not present

## 2014-12-01 DIAGNOSIS — G894 Chronic pain syndrome: Secondary | ICD-10-CM | POA: Diagnosis present

## 2014-12-01 DIAGNOSIS — M2010 Hallux valgus (acquired), unspecified foot: Secondary | ICD-10-CM | POA: Diagnosis not present

## 2014-12-01 DIAGNOSIS — M48061 Spinal stenosis, lumbar region without neurogenic claudication: Secondary | ICD-10-CM

## 2014-12-01 DIAGNOSIS — M4806 Spinal stenosis, lumbar region: Secondary | ICD-10-CM | POA: Diagnosis not present

## 2014-12-01 DIAGNOSIS — Z79899 Other long term (current) drug therapy: Secondary | ICD-10-CM | POA: Diagnosis not present

## 2014-12-01 DIAGNOSIS — M51369 Other intervertebral disc degeneration, lumbar region without mention of lumbar back pain or lower extremity pain: Secondary | ICD-10-CM

## 2014-12-01 DIAGNOSIS — Z5181 Encounter for therapeutic drug level monitoring: Secondary | ICD-10-CM | POA: Insufficient documentation

## 2014-12-01 MED ORDER — FENTANYL 50 MCG/HR TD PT72: 50.0000 ug | MEDICATED_PATCH | TRANSDERMAL | Status: DC

## 2014-12-01 NOTE — Patient Instructions (Signed)
Please call if patient becomes overly sedated on the fentanyl patch at 50 g.

## 2014-12-01 NOTE — Progress Notes (Signed)
Subjective:    Patient ID: [name], female    DOB: 09-27-1932, 81 y.o.   MRN: [id number]  HPI Patient returns with her husband. She's had a bad month trying to reduce fentanyl patch 25 g. Husband had some hydrocodone and oxycodone at home from prior prescriptions and use these to supplement the fentanyl 25 g because of inadequate pain relief. She had pain over her entire spine as well as her shoulder area. She had no new trauma no new illnesses in the intercurrent time.  Review of systems negative for numbness in the arms, no diarrhea  Pain Inventory Average Pain 6 Pain Right Now 6 My pain is constant  In the last 24 hours, has pain interfered with the following? General activity 10 Relation with others 0 Enjoyment of life 10 What TIME of day is your pain at its worst? all Sleep (in general) Good  Pain is worse with: all Pain improves with: medication Relief from Meds: 0  Mobility walk with assistance use a walker  Function retired I need assistance with the following:  meal prep, household duties and shopping  Neuro/Psych numbness confusion  Prior Studies Any changes since last visit?  no  Physicians involved in your care Any changes since last visit?  no   Family History  Problem Relation Age of Onset  . Heart disease Mother   . Cancer Father     throat  . Atrial fibrillation Sister    History   Social History  . Marital Status: Married    Spouse Name: N/A  . Number of Children: N/A  . Years of Education: N/A   Social History Main Topics  . Smoking status: Current Every Day Smoker -- 0.50 packs/day for 65 years    Types: Cigarettes  . Smokeless tobacco: Never Used  . Alcohol Use: Yes     Comment: Occasional  . Drug Use: No  . Sexual Activity: No   Other Topics Concern  . None   Social History Narrative   Past Surgical History  Procedure Laterality Date  . Right ankle surgery    . Knee arthroscopy    . Intramedullary (im) nail  intertrochanteric Right 06/26/2014    Procedure: INTRAMEDULLARY (IM) NAIL INTERTROCHANTRIC;  Surgeon: Mauri Pole, MD;  Location: [facility];  Service: Orthopedics;  Laterality: Right;   Past Medical History  Diagnosis Date  . Paroxysmal atrial fibrillation     chronic anticoag until 01/2013 - stopped due to high fall risk  . Right bundle branch block and incomplete left bundle branch block     with left axis deviation  . Chronic back pain     narcotic dependence  . Lumbar disc disease     dr. Trenton Gammon  . Mitral valve prolapse   . Macular degeneration   . Gastritis     RUT negative, 2005  . Diverticulosis   . Alcohol abuse   . Compression fracture 02/2011    s/p KP  . Arthritis   . Depression   . Myocardial infarction   . COPD (chronic obstructive pulmonary disease)   . Intertrochanteric fracture of right hip 06/26/2014  . ANXIETY DEPRESSION 10/01/2009    Qualifier: Diagnosis of  By: Niel Hummer MD, Lorinda Creed   . Dementia 01/28/2013  . Diabetes mellitus without complication 89/21/1941  . Fall 01/21/2013  . HLD (hyperlipidemia) 05/30/2007    Qualifier: Diagnosis of  By: Niel Hummer MD, [place] Hypertension 02/20/2013  . Insomnia 01/28/2013  .  Protein-calorie malnutrition, severe 06/26/2014  . Weight loss, unintentional 02/08/2013  . VITAMIN D DEFICIENCY 03/20/2008    Qualifier: Diagnosis of  By: Cori Razor RN, Mikal Plane Tobacco abuse 06/26/2014   BP 104/51 mmHg  Pulse 45  Resp 14  SpO2 93%  Opioid Risk Score:   Fall Risk Score: Moderate Fall Risk (6-13 points)`1  Depression screen PHQ 2/9  Depression screen PHQ 2/9 10/09/2014  Decreased Interest 0  Down, Depressed, Hopeless 3  PHQ - 2 Score 3  Altered sleeping 0  Tired, decreased energy 2  Change in appetite 0  Feeling bad or failure about yourself  1  Trouble concentrating 0  Moving slowly or fidgety/restless 3  Suicidal thoughts 0  PHQ-9 Score 9     Review of Systems  Constitutional:       Weight loss     Gastrointestinal: Positive for constipation.  Neurological: Positive for numbness.  Psychiatric/Behavioral: Positive for confusion.  All other systems reviewed and are negative.      Objective:   Physical Exam  Constitutional: She appears well-developed and well-nourished. She appears listless.  HENT:  Head: Normocephalic and atraumatic.  Eyes: Conjunctivae and EOM are normal. Pupils are equal, round, and reactive to light.  Neck:  Reduced cervical range of motion, tenderness palpation bilateral upper trapezius area  Neurological: She appears listless.  Reflex Scores:      Patellar reflexes are 0 on the right side and 0 on the left side.      Achilles reflexes are 0 on the right side and 0 on the left side. Motor strength is 4/5 bilateral deltoids, biceps, triceps, grip, hip flexor, knee extensor, ankle dorsiflexor  Skin: Skin is warm and dry.  Psychiatric:  Tearful, no evidence of lability or agitation  Nursing note and vitals reviewed.         Assessment & Plan:  1. Severe cervical and lumbar degenerative disc with chronic spine pain. This appears to be severe enough to warrant narcotic analgesics on a chronic basis. She has had good relief with fentanyl patch 50 g to 72 hours. She's had no severe side effects. She has Had insufficient relief with 25 g however when she went up to 75 g she was overly sedated.   Patient may benefit from lumbar medial branch blocks positioning on the table may be difficult due to severe kyphosis  2. Lumbar spinal stenosis with radiological evidence of nerve root compression left S1. Patient does not have any complaints in this area, just feels a little weak. She does have severe diabetic neuropathy which may be masking symptoms  3. Diabetic neuropathy severe, This is not severely painful, mainly just numb. She is doing a good job with foot hygiene, assisted by husband, Would not start gabapentin at this point   4. Kyphoscoliosis Patient  with poor mobility stays in seated position much of the day

## 2014-12-30 ENCOUNTER — Encounter: Payer: Self-pay | Admitting: Registered Nurse

## 2014-12-30 ENCOUNTER — Encounter: Payer: Medicare Other | Attending: Physical Medicine & Rehabilitation | Admitting: Registered Nurse

## 2014-12-30 VITALS — BP 118/66 | HR 85

## 2014-12-30 DIAGNOSIS — E1142 Type 2 diabetes mellitus with diabetic polyneuropathy: Secondary | ICD-10-CM | POA: Insufficient documentation

## 2014-12-30 DIAGNOSIS — M4806 Spinal stenosis, lumbar region: Secondary | ICD-10-CM | POA: Insufficient documentation

## 2014-12-30 DIAGNOSIS — Z5181 Encounter for therapeutic drug level monitoring: Secondary | ICD-10-CM | POA: Insufficient documentation

## 2014-12-30 DIAGNOSIS — M51369 Other intervertebral disc degeneration, lumbar region without mention of lumbar back pain or lower extremity pain: Secondary | ICD-10-CM

## 2014-12-30 DIAGNOSIS — M5030 Other cervical disc degeneration, unspecified cervical region: Secondary | ICD-10-CM | POA: Diagnosis not present

## 2014-12-30 DIAGNOSIS — Z79899 Other long term (current) drug therapy: Secondary | ICD-10-CM | POA: Insufficient documentation

## 2014-12-30 DIAGNOSIS — M2010 Hallux valgus (acquired), unspecified foot: Secondary | ICD-10-CM | POA: Diagnosis not present

## 2014-12-30 DIAGNOSIS — M5136 Other intervertebral disc degeneration, lumbar region: Secondary | ICD-10-CM

## 2014-12-30 DIAGNOSIS — G894 Chronic pain syndrome: Secondary | ICD-10-CM | POA: Diagnosis not present

## 2014-12-30 DIAGNOSIS — M48061 Spinal stenosis, lumbar region without neurogenic claudication: Secondary | ICD-10-CM

## 2014-12-30 MED ORDER — FENTANYL 50 MCG/HR TD PT72: 50.0000 ug | MEDICATED_PATCH | TRANSDERMAL | Status: DC

## 2014-12-30 MED ORDER — HYDROCODONE-ACETAMINOPHEN 5-325 MG PO TABS: 1.0000 | ORAL_TABLET | Freq: Two times a day (BID) | ORAL | Status: DC | PRN

## 2014-12-30 NOTE — Progress Notes (Signed)
Subjective:    Patient ID: [name], female    DOB: 07-10-1932, 81 y.o.   MRN: [id number]  HPI: [name] is a 81 year old female who returns for follow up for chronic pain and medication refill. She says her pain is located in her mid-back and lower extremities. Also complaining of generalized pain. She rates her pain 6. Her current exercise regime is chair exercises 1-2 times daily. Also states the pain has intensified request Norco or Oxycodone. Spoke with Dr. Letta Pate will order Hydrocodone 5mg  BID. [name] verbalizes understanding. Also states she had three falls. 1. She was putting clothes in the [place] her footing landed on her left side. Her husband helped her up. Didn't seek medical attention. 2. She was preparing for bed was reaching for her walker and her foot became tangled and landed on the right side. Her husband helped her up. She didn't seek medical attention. 3. Last night she was sitting on the edge of bed she was turning the light off, she slid off the edge of bed on her right side. Her husband helped her up, she didn't seek medical attention. Arrived to office in wheelchair.  Husband in room all questions answers.  Pain Inventory Average Pain 10 Pain Right Now 6 My pain is constant  In the last 24 hours, has pain interfered with the following? General activity 10 Relation with others 10 Enjoyment of life 10 What TIME of day is your pain at its worst? morning, daytime, evening, night Sleep (in general) Fair  Pain is worse with: na Pain improves with: medication Relief from Meds: 5  Mobility walk with assistance use a walker  Function retired I need assistance with the following:  meal prep, household duties and shopping  Neuro/Psych numbness confusion  Prior Studies Any changes since last visit?  no  Physicians involved in your care Any changes since last visit?  no   Family History  Problem Relation Age of Onset  . Heart  disease Mother   . Cancer Father     throat  . Atrial fibrillation Sister    Social History   Social History  . Marital Status: Married    Spouse Name: N/A  . Number of Children: N/A  . Years of Education: N/A   Social History Main Topics  . Smoking status: Current Every Day Smoker -- 0.50 packs/day for 65 years    Types: Cigarettes  . Smokeless tobacco: Never Used  . Alcohol Use: Yes     Comment: Occasional  . Drug Use: No  . Sexual Activity: No   Other Topics Concern  . None   Social History Narrative   Past Surgical History  Procedure Laterality Date  . Right ankle surgery    . Knee arthroscopy    . Intramedullary (im) nail intertrochanteric Right 06/26/2014    Procedure: INTRAMEDULLARY (IM) NAIL INTERTROCHANTRIC;  Surgeon: Mauri Pole, MD;  Location: [facility];  Service: Orthopedics;  Laterality: Right;   Past Medical History  Diagnosis Date  . Paroxysmal atrial fibrillation     chronic anticoag until 01/2013 - stopped due to high fall risk  . Right bundle branch block and incomplete left bundle branch block     with left axis deviation  . Chronic back pain     narcotic dependence  . Lumbar disc disease     dr. Trenton Gammon  . Mitral valve prolapse   . Macular degeneration   . Gastritis  RUT negative, 2005  . Diverticulosis   . Alcohol abuse   . Compression fracture 02/2011    s/p KP  . Arthritis   . Depression   . Myocardial infarction   . COPD (chronic obstructive pulmonary disease)   . Intertrochanteric fracture of right hip 06/26/2014  . ANXIETY DEPRESSION 10/01/2009    Qualifier: Diagnosis of  By: Niel Hummer MD, Lorinda Creed   . Dementia 01/28/2013  . Diabetes mellitus without complication 81/02/3158  . Fall 01/21/2013  . HLD (hyperlipidemia) 05/30/2007    Qualifier: Diagnosis of  By: Niel Hummer MD, [place] Hypertension 02/20/2013  . Insomnia 01/28/2013  . Protein-calorie malnutrition, severe 06/26/2014  . Weight loss, unintentional 02/08/2013  .  VITAMIN D DEFICIENCY 03/20/2008    Qualifier: Diagnosis of  By: Cori Razor RN, Mikal Plane Tobacco abuse 06/26/2014   BP 118/66 mmHg  Pulse 85  SpO2 94%  Opioid Risk Score:   Fall Risk Score:  `1  Depression screen PHQ 2/9  Depression screen [facility] 2/9 12/30/2014 10/09/2014  Decreased Interest 0 0  Down, Depressed, Hopeless 0 3  PHQ - 2 Score 0 3  Altered sleeping - 0  Tired, decreased energy - 2  Change in appetite - 0  Feeling bad or failure about yourself  - 1  Trouble concentrating - 0  Moving slowly or fidgety/restless - 3  Suicidal thoughts - 0  PHQ-9 Score - 9      Review of Systems  All other systems reviewed and are negative.      Objective:   Physical Exam  Constitutional: She is oriented to person, place, and time. She appears well-developed and well-nourished.  HENT:  Head: Normocephalic and atraumatic.  Neck: Normal range of motion. Neck supple.  Cardiovascular: Normal rate and regular rhythm.   Pulmonary/Chest: Effort normal and breath sounds normal.  Musculoskeletal:  Normal Muscle Bulk and Muscle Testing Reveals: Upper Extremities: Full ROM and Muscle Strength 5/5 Thoracic and Lumbar Hypersensitivity Lower Extremities: Full ROM and Muscle Strength 5/5 Arrived in wheelchair  Neurological: She is alert and oriented to person, place, and time.  Skin: Skin is warm and dry.  Psychiatric: She has a normal mood and affect.  Nursing note and vitals reviewed.         Assessment & Plan:  1. Severe cervical and lumbar degenerative disc with chronic spine pain. Refilled: Fentanyl 50 mcg one patch every three days #10 and RX: Hydrocodone 5/325 mg one tablet twice a day as needed for moderate pain. #60. 2. Lumbar spinal stenosis with  nerve root compression left S1. Continue to Monitor. 3. Diabetic neuropathy severe: Continue to monitor 4. Kyphoscoliosis: Continue to Monitor / Poor Mobility  30 minutes of face to face patient care time was spent during this  visit. All questions were encouraged and answered.  F/U in 1 month

## 2015-01-01 ENCOUNTER — Telehealth: Payer: Self-pay

## 2015-01-01 NOTE — Telephone Encounter (Signed)
Received refill request for pt - Namenda XR refill Fax back rx request with note that stated pt is no longer at this practice.

## 2015-01-26 ENCOUNTER — Encounter: Payer: Medicare Other | Attending: Physical Medicine & Rehabilitation

## 2015-01-26 ENCOUNTER — Ambulatory Visit (HOSPITAL_BASED_OUTPATIENT_CLINIC_OR_DEPARTMENT_OTHER): Payer: Medicare Other | Admitting: Physical Medicine & Rehabilitation

## 2015-01-26 ENCOUNTER — Encounter: Payer: Self-pay | Admitting: Physical Medicine & Rehabilitation

## 2015-01-26 VITALS — BP 126/57 | HR 66

## 2015-01-26 DIAGNOSIS — M48061 Spinal stenosis, lumbar region without neurogenic claudication: Secondary | ICD-10-CM

## 2015-01-26 DIAGNOSIS — F1010 Alcohol abuse, uncomplicated: Secondary | ICD-10-CM

## 2015-01-26 DIAGNOSIS — M5030 Other cervical disc degeneration, unspecified cervical region: Secondary | ICD-10-CM

## 2015-01-26 DIAGNOSIS — M5136 Other intervertebral disc degeneration, lumbar region: Secondary | ICD-10-CM

## 2015-01-26 DIAGNOSIS — M2010 Hallux valgus (acquired), unspecified foot: Secondary | ICD-10-CM | POA: Diagnosis not present

## 2015-01-26 DIAGNOSIS — Z79899 Other long term (current) drug therapy: Secondary | ICD-10-CM | POA: Diagnosis not present

## 2015-01-26 DIAGNOSIS — E1142 Type 2 diabetes mellitus with diabetic polyneuropathy: Secondary | ICD-10-CM | POA: Insufficient documentation

## 2015-01-26 DIAGNOSIS — M51369 Other intervertebral disc degeneration, lumbar region without mention of lumbar back pain or lower extremity pain: Secondary | ICD-10-CM

## 2015-01-26 DIAGNOSIS — G894 Chronic pain syndrome: Secondary | ICD-10-CM | POA: Insufficient documentation

## 2015-01-26 DIAGNOSIS — M4806 Spinal stenosis, lumbar region: Secondary | ICD-10-CM | POA: Insufficient documentation

## 2015-01-26 DIAGNOSIS — Z5181 Encounter for therapeutic drug level monitoring: Secondary | ICD-10-CM | POA: Diagnosis not present

## 2015-01-26 MED ORDER — HYDROCODONE-ACETAMINOPHEN 5-325 MG PO TABS: 1.0000 | ORAL_TABLET | Freq: Two times a day (BID) | ORAL | Status: DC | PRN

## 2015-01-26 NOTE — Progress Notes (Addendum)
Subjective:    Patient ID: [name], female    DOB: [birth year], 81 y.o.   MRN: [id number]  HPI 81 year old female with history of dementia, falls, history of hip fracture status post ORIF with nursing home placement earlier this year. In addition she has severe lumbar degenerative disc, lumbar and thoracic scoliosis as well as thoracic kyphosis. She has limited ambulation and spends most of her time in the wheelchair and bed. She did however walk from her car to our office which is greater than 100 feet with a walker.  Husband is complaining about urine drug screens, out of pocket medical costs. He feels like the addition of hydrocodone has been helpful for the patient's pain but states that she is more sleepy when taking this medication. We discussed stopping this medication however both patient and the husband would rather not Pain Inventory Average Pain 10 Pain Right Now 6 My pain is constant  In the last 24 hours, has pain interfered with the following? General activity 10 Relation with others 10 Enjoyment of life 10 What TIME of day is your pain at its worst? all Sleep (in general) Fair  Pain is worse with: some activites Pain improves with: medication Relief from Meds: 5  Mobility walk with assistance use a walker use a wheelchair needs help with transfers  Function retired I need assistance with the following:  meal prep, household duties and shopping  Neuro/Psych numbness confusion  Prior Studies Any changes since last visit?  no  Physicians involved in your care Any changes since last visit?  no   Family History  Problem Relation Age of Onset  . Heart disease Mother   . Cancer Father     throat  . Atrial fibrillation Sister    Social History   Social History  . Marital Status: Married    Spouse Name: N/A  . Number of Children: N/A  . Years of Education: N/A   Social History Main Topics  . Smoking status: Current Every Day Smoker -- 0.50  packs/day for 65 years    Types: Cigarettes  . Smokeless tobacco: Never Used  . Alcohol Use: Yes     Comment: Occasional  . Drug Use: No  . Sexual Activity: No   Other Topics Concern  . None   Social History Narrative   Past Surgical History  Procedure Laterality Date  . Right ankle surgery    . Knee arthroscopy    . Intramedullary (im) nail intertrochanteric Right 06/26/2014    Procedure: INTRAMEDULLARY (IM) NAIL INTERTROCHANTRIC;  Surgeon: Mauri Pole, MD;  Location: [facility];  Service: Orthopedics;  Laterality: Right;   Past Medical History  Diagnosis Date  . Paroxysmal atrial fibrillation     chronic anticoag until 01/2013 - stopped due to high fall risk  . Right bundle branch block and incomplete left bundle branch block     with left axis deviation  . Chronic back pain     narcotic dependence  . Lumbar disc disease     dr. Trenton Gammon  . Mitral valve prolapse   . Macular degeneration   . Gastritis     RUT negative, 2005  . Diverticulosis   . Alcohol abuse   . Compression fracture 02/2011    s/p KP  . Arthritis   . Depression   . Myocardial infarction   . COPD (chronic obstructive pulmonary disease)   . Intertrochanteric fracture of right hip 06/26/2014  . ANXIETY DEPRESSION 10/01/2009  Qualifier: Diagnosis of  By: Niel Hummer MD, Lorinda Creed   . Dementia 01/28/2013  . Diabetes mellitus without complication 25/95/6387  . Fall 01/21/2013  . HLD (hyperlipidemia) 05/30/2007    Qualifier: Diagnosis of  By: Niel Hummer MD, [place] Hypertension 02/20/2013  . Insomnia 01/28/2013  . Protein-calorie malnutrition, severe 06/26/2014  . Weight loss, unintentional 02/08/2013  . VITAMIN D DEFICIENCY 03/20/2008    Qualifier: Diagnosis of  By: Cori Razor RN, Mikal Plane Tobacco abuse 06/26/2014   BP 126/57 mmHg  Pulse 66  SpO2 97%  Opioid Risk Score:   Fall Risk Score:  `1  Depression screen PHQ 2/9  Depression screen [facility] 2/9 12/30/2014 10/09/2014  Decreased Interest 0 0  Down,  Depressed, Hopeless 0 3  PHQ - 2 Score 0 3  Altered sleeping - 0  Tired, decreased energy - 2  Change in appetite - 0  Feeling bad or failure about yourself  - 1  Trouble concentrating - 0  Moving slowly or fidgety/restless - 3  Suicidal thoughts - 0  PHQ-9 Score - 9     Review of Systems  Neurological: Positive for numbness.  Psychiatric/Behavioral: Positive for confusion.  All other systems reviewed and are negative.      Objective:   Physical Exam  Constitutional: She is oriented to person, place, and time. She appears well-developed and well-nourished.  HENT:  Head: Normocephalic and atraumatic.  Eyes: Conjunctivae and EOM are normal. Pupils are equal, round, and reactive to light.  Neurological: She is alert and oriented to person, place, and time.  Psychiatric: She has a normal mood and affect.  Nursing note and vitals reviewed.   S-shaped curve levoconvex and lumbar dextroconvex in lower thoracic Bilateral knee contracture about 10 Motor strength is 4 minus bilateral deltoid, biceps, triceps, grip, hip flexors, knee extensors, ankle dorsi flexor plantar flexor Lumbar range of motion is extremely limited particularly with extension she gets basically 20 flexion is approximately 25% Negative straight leg raising She is oriented to person place and time Patient is alert and interactive      Assessment & Plan:  1. Severe cervical and lumbar degenerative disc with chronic spine pain. This appears to be severe enough to warrant narcotic analgesics on a chronic basis. She has had good relief with fentanyl patch 50 g to 72 hours. She's had no severe side effects.  when she went up to 75 g she was overly sedated. Failed trial of 25 g patch, Excessive pain at this dose  Added hydrocodone 5 mg twice a day when necessary for breakthrough. According to husband she is much more comfortable on this but is sleeping excessively. Patient states that she really doesn't sleep that  much. We discussed that there needs to be a balance between pain relief and adequate activity. We also discussed that narcotic analgesics on average provide only a 10-30% reduction of pain on a chronic basis. Further increases of medication dose do not necessarily lead to improvement in pain and are more likely to cause side effects  We discussed that her current regimen  of Fentanyl  50 g every 72 hours as well as hydrocodone 5 mg twice a day is the maximum dose I am willing to prescribe given her reduced level of activity, current side effects, other medications she is on, as well as underlying cognitive deficits  Husband States he would like to keep her comfortable. We discussed that this is pain management and not  palliative care. I did offer her referral to a new outpatient palliative care clinic.  Husband states that he would like me to forward this information to his primary care physician should his wife require this in the future  Referral information [facility] [place]. The number for the referral center is [phone]   We discussed the recent FDA recommendation not to combine benzodiazepines with narcotic analgesics. Her primary care is prescribing these.  Don't think she could tolerate being on the fluoroscopy table for lumbar injections  2. Lumbar spinal stenosis with radiological evidence of nerve root compression left S1. Patient does not have any complaints in this area, just feels a little weak. She does have severe diabetic neuropathy which may be masking symptoms But she does have intact light touch Sensation on the feet  Husband states that in the past her PCP has not wanting to give her medicine such as Lyrica or gabapentin due to all the other medicines she is already on   4. Kyphoscoliosis cervical thoracic area, severe with chronic compression fractures T5 and T10  Over half of the 25 min visit was spent counseling and coordinating  care, Gave patient and husband the current FDA warning on the combination of opioids and benzodiazepines.

## 2015-01-26 NOTE — Patient Instructions (Signed)
Will send your primary care physician information about the new palliative care clinic here in [facility]

## 2015-02-17 ENCOUNTER — Encounter: Payer: Self-pay | Admitting: Internal Medicine

## 2015-02-17 ENCOUNTER — Ambulatory Visit (INDEPENDENT_AMBULATORY_CARE_PROVIDER_SITE_OTHER): Payer: Medicare Other | Admitting: Internal Medicine

## 2015-02-17 VITALS — BP 114/62 | HR 58 | Ht 65.0 in | Wt 96.0 lb

## 2015-02-17 DIAGNOSIS — I480 Paroxysmal atrial fibrillation: Secondary | ICD-10-CM | POA: Diagnosis not present

## 2015-02-17 LAB — BASIC METABOLIC PANEL WITH GFR
BUN: 17 mg/dL (ref 7–25)
CO2: 30 mmol/L (ref 20–31)
Calcium: 9.2 mg/dL (ref 8.6–10.4)
Chloride: 99 mmol/L (ref 98–110)
Creat: 0.55 mg/dL — ABNORMAL LOW (ref 0.60–0.93)
Glucose, Bld: 74 mg/dL (ref 65–99)
Potassium: 4.8 mmol/L (ref 3.5–5.3)
Sodium: 133 mmol/L — ABNORMAL LOW (ref 135–146)

## 2015-02-17 LAB — CBC WITH DIFFERENTIAL/PLATELET
Basophils Absolute: 0 10*3/uL (ref 0.0–0.1)
Basophils Relative: 0 % (ref 0–1)
Eosinophils Absolute: 0.3 10*3/uL (ref 0.0–0.7)
Eosinophils Relative: 5 % (ref 0–5)
HCT: 36.2 % (ref 36.0–46.0)
Hemoglobin: 12.6 g/dL (ref 12.0–15.0)
Lymphocytes Relative: 38 % (ref 12–46)
Lymphs Abs: 2.2 10*3/uL (ref 0.7–4.0)
MCH: 34.4 pg — ABNORMAL HIGH (ref 26.0–34.0)
MCHC: 34.8 g/dL (ref 30.0–36.0)
MCV: 98.9 fL (ref 78.0–100.0)
MPV: 10.6 fL (ref 8.6–12.4)
Monocytes Absolute: 0.7 10*3/uL (ref 0.1–1.0)
Monocytes Relative: 12 % (ref 3–12)
Neutro Abs: 2.6 10*3/uL (ref 1.7–7.7)
Neutrophils Relative %: 45 % (ref 43–77)
Platelets: 192 10*3/uL (ref 150–400)
RBC: 3.66 MIL/uL — ABNORMAL LOW (ref 3.87–5.11)
RDW: 13.2 % (ref 11.5–15.5)
WBC: 5.8 10*3/uL (ref 4.0–10.5)

## 2015-02-17 MED ORDER — WARFARIN SODIUM 2.5 MG PO TABS: 2.5000 mg | ORAL_TABLET | Freq: Every day | ORAL | Status: DC

## 2015-02-17 NOTE — Patient Instructions (Addendum)
Medication Instructions: 1) Stop aspirin 2) Stop metoprolol 3) Start Warfarin 2.5 mg take one tablet by mouth every evening  Labwork: - Your physician recommends that you have lab work today: BMP/ CBC  Procedures/Testing: - none  Follow-Up: - Your physician recommends that you schedule a follow-up appointment : Friday with the [place] physician wants you to follow-up in: 6 months with Dr. Caryl Comes. You will receive a reminder letter in the mail two months in advance. If you don't receive a letter, please call our office to schedule the follow-up appointment.  Any Additional Special Instructions Will Be Listed Below (If Applicable). - none

## 2015-02-17 NOTE — Progress Notes (Signed)
Patient Care Team: Carol Ada, MD as PCP - General (Family Medicine) Deboraha Sprang, MD (Cardiology) Hurman Horn, MD (Ophthalmology)   HPI  [name] is a 81 y.o. female Seen in follow-up for atrial fibrillation. She was last seen 2011 by me; she was seen by LG7/16. Her note recounts an echocardiogram 2011 with an EF of 55-60% with significant back some degree of MS and mild MR. She had been on Coumadin until 9/14 when because of a fall Coumadin was discontinued.  The fall occurred when her feet got caught her pajamas while in the bathroom  She has struggled with chronic narcotic use ;  She still has pain   She is in a wheelchair  She's had no atrial fibrillation of which she is aware;    She continues to struggle with her husband verbally  Records and Results Reviewed hospital  Past Medical History  Diagnosis Date  . Paroxysmal atrial fibrillation (HCC)     chronic anticoag until 01/2013 - stopped due to high fall risk  . Right bundle branch block and incomplete left bundle branch block     with left axis deviation  . Chronic back pain     narcotic dependence  . Lumbar disc disease     dr. Trenton Gammon  . Mitral valve prolapse   . Macular degeneration   . Gastritis     RUT negative, 2005  . Diverticulosis   . Alcohol abuse   . Compression fracture 02/2011    s/p KP  . Arthritis   . Depression   . Myocardial infarction ([place])   . COPD (chronic obstructive pulmonary disease) ([place])   . Intertrochanteric fracture of right hip ([place]) 06/26/2014  . ANXIETY DEPRESSION 10/01/2009    Qualifier: Diagnosis of  By: Niel Hummer MD, Lorinda Creed   . Dementia 01/28/2013  . Diabetes mellitus without complication ([place]) 17/79/3903  . Fall 01/21/2013  . HLD (hyperlipidemia) 05/30/2007    Qualifier: Diagnosis of  By: Niel Hummer MD, [place] Hypertension 02/20/2013  . Insomnia 01/28/2013  . Protein-calorie malnutrition, severe ([place]) 06/26/2014  . Weight loss, unintentional 02/08/2013    . VITAMIN D DEFICIENCY 03/20/2008    Qualifier: Diagnosis of  By: Cori Razor RN, Mikal Plane Tobacco abuse 06/26/2014    Past Surgical History  Procedure Laterality Date  . Right ankle surgery    . Knee arthroscopy    . Intramedullary (im) nail intertrochanteric Right 06/26/2014    Procedure: INTRAMEDULLARY (IM) NAIL INTERTROCHANTRIC;  Surgeon: Mauri Pole, MD;  Location: [facility];  Service: Orthopedics;  Laterality: Right;    Current Outpatient Prescriptions  Medication Sig Dispense Refill  . aspirin EC 81 MG tablet Take 1 tablet (81 mg total) by mouth daily. 150 tablet 2  . b complex vitamins tablet Take 1 tablet by mouth daily.    . Calcium Carb-Cholecalciferol (CALCIUM 600-D PO) Take 600 mg by mouth daily.    . Cholecalciferol (VITAMIN D) 1000 UNITS capsule Take 1,200 Units by mouth daily.     . diazepam (VALIUM) 5 MG tablet Take 1 tablet (5 mg total) by mouth every 6 (six) hours as needed. for anxiety 30 tablet 0  . fentaNYL (DURAGESIC - DOSED MCG/HR) 50 MCG/HR Place 1 patch (50 mcg total) onto the skin every 3 (three) days. 10 patch 0  . HYDROcodone-acetaminophen (NORCO/VICODIN) 5-325 MG per tablet Take 1 tablet by mouth 2 (two) times daily as needed for  moderate pain. 60 tablet 0  . metFORMIN (GLUCOPHAGE) 500 MG tablet Take 1 tablet (500 mg total) by mouth daily with breakfast. 90 tablet 2  . metoprolol tartrate (LOPRESSOR) 25 MG tablet Take 25 mg by mouth. Take 1/2 tablet by mouth in the morning and 1 tablet by mouth in the evening    . Multiple Vitamin (MULTIVITAMIN WITH MINERALS) TABS tablet Take 1 tablet by mouth daily.    Marland Kitchen NAMENDA XR 14 MG CP24 TAKE 1 CAPSULE BY MOUTH DAILY. 30 capsule 11  . PARoxetine (PAXIL) 40 MG tablet TAKE 1 TABLET (40 MG TOTAL) BY MOUTH EVERY MORNING. 30 tablet 5  . polyethylene glycol powder (GLYCOLAX/MIRALAX) powder Take 17 g by mouth daily. 3350 g 1  . pravastatin (PRAVACHOL) 80 MG tablet Take 1 tablet (80 mg total) by mouth daily. 30 tablet 6  .  temazepam (RESTORIL) 30 MG capsule Take 30 mg by mouth at bedtime as needed for sleep (sleep).    . verapamil (CALAN-SR) 180 MG CR tablet TAKE ONE TABLET BY MOUTH TWICE DAILY 60 tablet 5   No current facility-administered medications for this visit.    Allergies  Allergen Reactions  . Codeine Other (See Comments)    unknown  . Morphine And Related Other (See Comments)    Pt had severe respiratory depression and excessive sedation during a recent hospitalization and was told it was 2/2 morphine.      Review of Systems negative except from HPI and PMH  Physical Exam BP 114/62 mmHg  Pulse 58  Ht 5\' 5"  (1.651 m)  Wt 96 lb (43.545 kg)  BMI 15.98 kg/m2 Well developed and well nourished in no acute distress HENT normal E scleral and icterus clear Neck Supple JVP flat; carotids brisk and full Clear to ausculation  Regular rate and rhythm, no murmurs gallops or rub Soft with active bowel sounds No clubbing cyanosis Trace Edema Alert and oriented, grossly normal motor and sensory function Skin Warm and Dry    Assessment and  Plan  Atrial fibrillation-paroxysmal  Fall risk  Mitral valvular disease with a calcified and restricted mitral valve leaflets without  Stenosis  Anemia  This patients CHA2DS2-VASc Score and unadjusted Ischemic Stroke Rate (% per year) is equal to 7.2 % stroke rate/year from a score of 5  Above score calculated as 1 point each if present [CHF, HTN, DM, Vascular=MI/PAD/Aortic Plaque, Age if 65-74, or Female] Above score calculated as 2 points each if present [Age > 75, or Stroke/TIA/TE]  We will check her CBC today. Her creatinine was normal 2/16. If these are okay we will begin her on low-dose apixaban for her thromboembolic risk reduction.  Encouraged her to be in touch with her primary care physicians regarding her complex medical regime  After she left the office her husband became frustrated and expressive of his anger in the lobby regarding the  apixaban. I spoke with him here. He was concerned about the cost of apixaban and that other doctors in this practice "think it's poison". I told him that the most important thing the wife being on anticoagulated and it was his preference that she be returned to warfarin with which I agreed

## 2015-02-19 ENCOUNTER — Telehealth: Payer: Self-pay | Admitting: Internal Medicine

## 2015-02-19 NOTE — Telephone Encounter (Signed)
New Message   Patient c/o Palpitations:  High priority if patient c/o lightheadedness and shortness of breath.  1. How long have you been having palpitations?                   02/18/15 PM and today  2. Are you currently experiencing lightheadedness and shortness of breath?   SOB  3. Have you checked your BP and heart rate? (document readings)   NO she cant do it herself  4. Are you experiencing any other symptoms? no

## 2015-02-19 NOTE — Telephone Encounter (Signed)
Spoke with [name]. She reports being "aware of every heart beat". Describes as pounding and heart beat "feels hard". No chest pain. Doesn't feel like heart is skipping. Doesn't know heart rate. Does not feel dizzy. Some shortness of breath that resolves with rest.  Was able to sleep last night after taking a sleeping pill. [name] reports she has not had this for a long time and it feels like when she had atrial fib in the past.  Is taking Cardizem and Coumadin as listed.  Will forward to Dr. Caryl Comes for recommendations.

## 2015-02-20 NOTE — Telephone Encounter (Signed)
I called and spoke with the patient to follow up. She reports that after she spoke with Fraser Din yesterday afternoon, her heart pounding became very severe and she started to have some dizziness. She was unable to check her HR/ BP at the time. She reports that about 7 pm last night, she took a dose of her lopressor and this settled down for her. Her metoprolol was recently stopped at her office visit on Tuesday with Dr. Caryl Comes. She did not start coumadin 2.5 mg until yesterday. She left the office Tuesday prior to being able to schedule her coumadin follow up with our anticoagulation clinic. I advised her she needs to have this checked no later than Tuesday next week. Her husband is not available now to help her read out what her appointments are for next week. She advises she will call back Monday to schedule this.

## 2015-02-20 NOTE — Telephone Encounter (Signed)
F/u  Pt following up on previous note to talk about Dr Aquilla Hacker recommendations. Please call back and discuss.

## 2015-02-23 ENCOUNTER — Ambulatory Visit (INDEPENDENT_AMBULATORY_CARE_PROVIDER_SITE_OTHER): Payer: Medicare Other | Admitting: *Deleted

## 2015-02-23 DIAGNOSIS — Z5181 Encounter for therapeutic drug level monitoring: Secondary | ICD-10-CM | POA: Diagnosis not present

## 2015-02-23 DIAGNOSIS — I480 Paroxysmal atrial fibrillation: Secondary | ICD-10-CM

## 2015-02-23 LAB — POCT INR: INR: 1.3

## 2015-02-23 MED ORDER — WARFARIN SODIUM 5 MG PO TABS: ORAL_TABLET | ORAL | Status: DC

## 2015-02-23 NOTE — Patient Instructions (Signed)

## 2015-02-24 ENCOUNTER — Telehealth: Payer: Self-pay | Admitting: Internal Medicine

## 2015-02-24 NOTE — Telephone Encounter (Signed)
New Message   Pt wants a call back about Coumadin she states its a long story   But both Dr.Klein and Nira Conn is aware

## 2015-02-24 NOTE — Telephone Encounter (Signed)
I called and spoke with the patient.  INR was 1.3 yesterday and her warfarin dose was adjusted. She is scheduled to follow back up with [facility] on 10/17. She relayed to me that she went back on her metoprolol as it was discontinued at her last office visit on 10/4.  She developed some tachycardia, but has felt better back on her metoprolol. I advised her to continue this and that I would forward to Dr. Caryl Comes as an Juluis Rainier. I will call her back with any further recommendations.  She is agreeable.

## 2015-02-25 ENCOUNTER — Encounter: Payer: Self-pay | Admitting: Registered Nurse

## 2015-02-25 ENCOUNTER — Encounter: Payer: Medicare Other | Attending: Physical Medicine & Rehabilitation | Admitting: Registered Nurse

## 2015-02-25 ENCOUNTER — Other Ambulatory Visit: Payer: Self-pay | Admitting: Physical Medicine & Rehabilitation

## 2015-02-25 VITALS — BP 130/53 | HR 53 | Resp 14

## 2015-02-25 DIAGNOSIS — M5030 Other cervical disc degeneration, unspecified cervical region: Secondary | ICD-10-CM | POA: Insufficient documentation

## 2015-02-25 DIAGNOSIS — Z5181 Encounter for therapeutic drug level monitoring: Secondary | ICD-10-CM | POA: Diagnosis not present

## 2015-02-25 DIAGNOSIS — M4806 Spinal stenosis, lumbar region: Secondary | ICD-10-CM | POA: Insufficient documentation

## 2015-02-25 DIAGNOSIS — M2010 Hallux valgus (acquired), unspecified foot: Secondary | ICD-10-CM | POA: Insufficient documentation

## 2015-02-25 DIAGNOSIS — G894 Chronic pain syndrome: Secondary | ICD-10-CM | POA: Insufficient documentation

## 2015-02-25 DIAGNOSIS — Z79899 Other long term (current) drug therapy: Secondary | ICD-10-CM | POA: Insufficient documentation

## 2015-02-25 DIAGNOSIS — F1010 Alcohol abuse, uncomplicated: Secondary | ICD-10-CM

## 2015-02-25 DIAGNOSIS — E1142 Type 2 diabetes mellitus with diabetic polyneuropathy: Secondary | ICD-10-CM | POA: Diagnosis not present

## 2015-02-25 DIAGNOSIS — M5136 Other intervertebral disc degeneration, lumbar region: Secondary | ICD-10-CM

## 2015-02-25 DIAGNOSIS — M48061 Spinal stenosis, lumbar region without neurogenic claudication: Secondary | ICD-10-CM

## 2015-02-25 MED ORDER — HYDROCODONE-ACETAMINOPHEN 5-325 MG PO TABS: 1.0000 | ORAL_TABLET | Freq: Two times a day (BID) | ORAL | Status: DC | PRN

## 2015-02-25 NOTE — Progress Notes (Signed)
Subjective:    Patient ID: [name], female    DOB: Jul 26, 1932, 81 y.o.   MRN: [id number]  HPI: [name] is a 81 year old female who returns for follow up for chronic pain and medication refill. She says her pain is located in her neck, entire back and lower extremities. Also complaining of generalized pain. She rates her pain 6. Her current exercise regime is chair exercises 1-2 times daily also walks with a walker in her home. [name] looking for an increase in her medication she states she's in excruciating pain and cries daily, Dr. Letta Pate note reviewed with [name] they verbalizes understanding. [name] states " he's tired of seeing his wife in pain and crying daily, He States I'm Sick of It I've been dealing with this for 9 years". I will speak with Dr. Letta Pate in the morning and will give them a call they verbalize understanding. [name] would like for his wife to see Dr. Letta Pate only this was relayed to the Practice Administrator. November appointment scheduled with Dr. Letta Pate.  Pain Inventory Average Pain 10 Pain Right Now 6 My pain is constant  In the last 24 hours, has pain interfered with the following? General activity 10 Relation with others 10 Enjoyment of life 10 What TIME of day is your pain at its worst? all Sleep (in general) Fair  Pain is worse with: some activites Pain improves with: medication Relief from Meds: 5  Mobility walk with assistance use a walker use a wheelchair needs help with transfers  Function retired I need assistance with the following:  meal prep, household duties and shopping  Neuro/Psych numbness confusion  Prior Studies Any changes since last visit?  no  Physicians involved in your care Any changes since last visit?  no   Family History  Problem Relation Age of Onset  . Heart disease Mother   . Cancer Father     throat  . Atrial fibrillation Sister    Social History   Social History  .  Marital Status: Married    Spouse Name: N/A  . Number of Children: N/A  . Years of Education: N/A   Social History Main Topics  . Smoking status: Current Every Day Smoker -- 0.50 packs/day for 65 years    Types: Cigarettes  . Smokeless tobacco: Never Used  . Alcohol Use: Yes     Comment: Occasional  . Drug Use: No  . Sexual Activity: No   Other Topics Concern  . None   Social History Narrative   Past Surgical History  Procedure Laterality Date  . Right ankle surgery    . Knee arthroscopy    . Intramedullary (im) nail intertrochanteric Right 06/26/2014    Procedure: INTRAMEDULLARY (IM) NAIL INTERTROCHANTRIC;  Surgeon: Mauri Pole, MD;  Location: [facility];  Service: Orthopedics;  Laterality: Right;   Past Medical History  Diagnosis Date  . Paroxysmal atrial fibrillation (HCC)     chronic anticoag until 01/2013 - stopped due to high fall risk  . Right bundle branch block and incomplete left bundle branch block     with left axis deviation  . Chronic back pain     narcotic dependence  . Lumbar disc disease     dr. Trenton Gammon  . Mitral valve prolapse   . Macular degeneration   . Gastritis     RUT negative, 2005  . Diverticulosis   . Alcohol abuse   . Compression fracture 02/2011  s/p KP  . Arthritis   . Depression   . Myocardial infarction ([place])   . COPD (chronic obstructive pulmonary disease) ([place])   . Intertrochanteric fracture of right hip ([place]) 06/26/2014  . ANXIETY DEPRESSION 10/01/2009    Qualifier: Diagnosis of  By: Niel Hummer MD, Lorinda Creed   . Dementia 01/28/2013  . Diabetes mellitus without complication ([place]) 25/42/7062  . Fall 01/21/2013  . HLD (hyperlipidemia) 05/30/2007    Qualifier: Diagnosis of  By: Niel Hummer MD, [place] Hypertension 02/20/2013  . Insomnia 01/28/2013  . Protein-calorie malnutrition, severe ([place]) 06/26/2014  . Weight loss, unintentional 02/08/2013  . VITAMIN D DEFICIENCY 03/20/2008    Qualifier: Diagnosis of  By: Cori Razor RN, Mikal Plane  Tobacco abuse 06/26/2014   BP 130/53 mmHg  Pulse 53  Resp 14  SpO2 99%  Opioid Risk Score:   Fall Risk Score:  `1  Depression screen PHQ 2/9  Depression screen [facility] 2/9 01/26/2015 12/30/2014 10/09/2014  Decreased Interest 0 0 0  Down, Depressed, Hopeless 0 0 3  PHQ - 2 Score 0 0 3  Altered sleeping - - 0  Tired, decreased energy - - 2  Change in appetite - - 0  Feeling bad or failure about yourself  - - 1  Trouble concentrating - - 0  Moving slowly or fidgety/restless - - 3  Suicidal thoughts - - 0  PHQ-9 Score - - 9     Review of Systems  Neurological: Positive for numbness.  Psychiatric/Behavioral: Positive for confusion.  All other systems reviewed and are negative.      Objective:   Physical Exam  Constitutional: She is oriented to person, place, and time. She appears well-developed and well-nourished.  HENT:  Head: Normocephalic and atraumatic.  Neck: Normal range of motion. Neck supple.  Cardiovascular: Normal rate and regular rhythm.   Pulmonary/Chest: Effort normal and breath sounds normal.  Musculoskeletal:  Normal Muscle Bulk and Muscle Testing Reveals: Upper Extremities: Full ROM and Muscle Strength 5/5 Thoracic and Lumbar Hypersensitivity Lower Extremities: Decreased ROM and Muscle Strength 5/5 Arrived via whhelchair  Neurological: She is alert and oriented to person, place, and time.  Skin: Skin is warm and dry.  Psychiatric: She has a normal mood and affect.  Nursing note and vitals reviewed.         Assessment & Plan:  1. Severe cervical and lumbar degenerative disc with chronic spine pain. Refilled: Fentanyl 50 mcg one patch every three days #10 and  Hydrocodone 5/325 mg one tablet twice a day as needed for moderate pain. #60. Script given for Hydrocodone only. 2. Lumbar spinal stenosis with nerve root compression left S1. Continue to Monitor. 3. Diabetic neuropathy severe: Continue to monitor 4. Kyphoscoliosis: Continue to Monitor / Poor  Mobility  30 minutes of face to face patient care time was spent during this visit. All questions were encouraged and answered.  F/U in 1 month

## 2015-02-26 LAB — PMP ALCOHOL METABOLITE (ETG): ETGU: NEGATIVE ng/mL

## 2015-02-26 NOTE — Telephone Encounter (Signed)
Spoke with Dr. Letta Pate regarding Mr and Mrs. Woods request regarding medication changes. We will maintain her analgesics as written. Offer to place a referral for Pallative care also a list of Pain clinic's in the area for a second opinion he verbalizes understanding. At this time Mr. Paletta will relay the message to his wife Mrs. Ruben and call the office with their decision.

## 2015-03-02 ENCOUNTER — Ambulatory Visit (INDEPENDENT_AMBULATORY_CARE_PROVIDER_SITE_OTHER): Payer: Medicare Other | Admitting: Pharmacist Clinician (PhC)/ Clinical Pharmacy Specialist

## 2015-03-02 DIAGNOSIS — Z5181 Encounter for therapeutic drug level monitoring: Secondary | ICD-10-CM | POA: Diagnosis not present

## 2015-03-02 DIAGNOSIS — I480 Paroxysmal atrial fibrillation: Secondary | ICD-10-CM

## 2015-03-02 LAB — POCT INR: INR: 2.6

## 2015-03-09 ENCOUNTER — Ambulatory Visit (INDEPENDENT_AMBULATORY_CARE_PROVIDER_SITE_OTHER): Payer: Medicare Other | Admitting: Pharmacist Clinician (PhC)/ Clinical Pharmacy Specialist

## 2015-03-09 DIAGNOSIS — Z5181 Encounter for therapeutic drug level monitoring: Secondary | ICD-10-CM | POA: Diagnosis not present

## 2015-03-09 DIAGNOSIS — I480 Paroxysmal atrial fibrillation: Secondary | ICD-10-CM | POA: Diagnosis not present

## 2015-03-09 LAB — POCT INR: INR: 2.5

## 2015-03-11 ENCOUNTER — Ambulatory Visit: Payer: Medicare Other | Admitting: Pharmacist Clinician (PhC)/ Clinical Pharmacy Specialist

## 2015-03-11 LAB — BENZODIAZEPINES (GC/LC/MS), URINE

## 2015-03-11 LAB — OPIATES/OPIOIDS (LC/MS-MS)

## 2015-03-12 LAB — PRESCRIPTION MONITORING PROFILE (SOLSTAS)
AMPHETAMINE/METH: NEGATIVE ng/mL
BARBITURATE SCREEN, URINE: NEGATIVE ng/mL
Buprenorphine, Urine: NEGATIVE ng/mL
CREATININE, URINE: 52.66 mg/dL (ref >20.0)
Cannabinoid Scrn, Ur: NEGATIVE ng/mL
Carisoprodol, Urine: NEGATIVE ng/mL
Cocaine Metabolites: NEGATIVE ng/mL
ECSTASY: NEGATIVE ng/mL
MEPERIDINE UR: NEGATIVE ng/mL
METHADONE SCREEN, URINE: NEGATIVE ng/mL
NITRITES URINE, INITIAL: NEGATIVE ug/mL
Oxycodone Screen, Ur: NEGATIVE ng/mL
PH URINE, INITIAL: 6.1 pH (ref 4.5–8.9)
PROPOXYPHENE: NEGATIVE ng/mL
Tapentadol, urine: NEGATIVE ng/mL
Tramadol Scrn, Ur: NEGATIVE ng/mL
ZOLPIDEM, URINE: NEGATIVE ng/mL

## 2015-03-23 ENCOUNTER — Ambulatory Visit (HOSPITAL_BASED_OUTPATIENT_CLINIC_OR_DEPARTMENT_OTHER): Payer: Medicare Other | Admitting: Physical Medicine & Rehabilitation

## 2015-03-23 ENCOUNTER — Encounter: Payer: Self-pay | Admitting: Physical Medicine & Rehabilitation

## 2015-03-23 ENCOUNTER — Encounter: Payer: Medicare Other | Attending: Physical Medicine & Rehabilitation

## 2015-03-23 ENCOUNTER — Ambulatory Visit (INDEPENDENT_AMBULATORY_CARE_PROVIDER_SITE_OTHER): Payer: Medicare Other | Admitting: Pharmacist Clinician (PhC)/ Clinical Pharmacy Specialist

## 2015-03-23 ENCOUNTER — Other Ambulatory Visit: Payer: Self-pay | Admitting: Physical Medicine & Rehabilitation

## 2015-03-23 VITALS — BP 126/77 | HR 60

## 2015-03-23 DIAGNOSIS — M2010 Hallux valgus (acquired), unspecified foot: Secondary | ICD-10-CM | POA: Insufficient documentation

## 2015-03-23 DIAGNOSIS — G894 Chronic pain syndrome: Secondary | ICD-10-CM

## 2015-03-23 DIAGNOSIS — Z79899 Other long term (current) drug therapy: Secondary | ICD-10-CM | POA: Diagnosis not present

## 2015-03-23 DIAGNOSIS — M4806 Spinal stenosis, lumbar region: Secondary | ICD-10-CM | POA: Diagnosis not present

## 2015-03-23 DIAGNOSIS — Z5181 Encounter for therapeutic drug level monitoring: Secondary | ICD-10-CM | POA: Diagnosis not present

## 2015-03-23 DIAGNOSIS — I480 Paroxysmal atrial fibrillation: Secondary | ICD-10-CM

## 2015-03-23 DIAGNOSIS — M48061 Spinal stenosis, lumbar region without neurogenic claudication: Secondary | ICD-10-CM

## 2015-03-23 DIAGNOSIS — M5030 Other cervical disc degeneration, unspecified cervical region: Secondary | ICD-10-CM | POA: Insufficient documentation

## 2015-03-23 DIAGNOSIS — E1142 Type 2 diabetes mellitus with diabetic polyneuropathy: Secondary | ICD-10-CM | POA: Diagnosis not present

## 2015-03-23 LAB — POCT INR: INR: 2.1

## 2015-03-23 MED ORDER — HYDROCODONE-ACETAMINOPHEN 5-325 MG PO TABS: 1.0000 | ORAL_TABLET | Freq: Three times a day (TID) | ORAL | Status: DC | PRN

## 2015-03-23 MED ORDER — FENTANYL 50 MCG/HR TD PT72: 50.0000 ug | MEDICATED_PATCH | TRANSDERMAL | Status: DC

## 2015-03-23 NOTE — Patient Instructions (Addendum)
Please get up as much as possible during the day Every hour at least walk around the house  Do not take the Valium now that your hydrocodone dose is 3 times per day

## 2015-03-23 NOTE — Progress Notes (Signed)
Subjective:    Patient ID: [name], female    DOB: 03-09-1933, 81 y.o.   MRN: [id number] Chief complaint feels like feet are swollen HPI Walking with walker outside the home. Also using wheelchair for longer distances outside the home. In the house she'll use the walker most the time occasionally furniture walking. No falls. She showers with a shower chair. She does not require any assistance for dressing. She states that she gets up every hour or so. Her husband states that she only gets up every 2 hours to go to the bathroom or walk in the house. Has a swollen feeling in both feet, No trauma to her feet. No joint pain. This is been going on for a few weeks. Past history significant for diabetes as well as her artery disease Pain Inventory Average Pain 8 Pain Right Now NA My pain is constant  In the last 24 hours, has pain interfered with the following? General activity Na Relation with others NA Enjoyment of life NA What TIME of day is your pain at its worst? NA Sleep (in general) NA  Pain is worse with: NA Pain improves with: NA Relief from Meds: NA  Mobility walk with assistance use a walker how many minutes can you walk? 3-5 ability to climb steps?  no do you drive?  no use a wheelchair Do you have any goals in this area?  no  Function disabled: date disabled NA I need assistance with the following:  feeding, meal prep, household duties and shopping Do you have any goals in this area?  no  Neuro/Psych No problems in this area  Prior Studies Any changes since last visit?  no  Physicians involved in your care Any changes since last visit?  no   Family History  Problem Relation Age of Onset  . Heart disease Mother   . Cancer Father     throat  . Atrial fibrillation Sister    Social History   Social History  . Marital Status: Married    Spouse Name: N/A  . Number of Children: N/A  . Years of Education: N/A   Social History Main Topics  .  Smoking status: Current Every Day Smoker -- 0.50 packs/day for 65 years    Types: Cigarettes  . Smokeless tobacco: Never Used  . Alcohol Use: Yes     Comment: Occasional  . Drug Use: No  . Sexual Activity: No   Other Topics Concern  . None   Social History Narrative   Past Surgical History  Procedure Laterality Date  . Right ankle surgery    . Knee arthroscopy    . Intramedullary (im) nail intertrochanteric Right 06/26/2014    Procedure: INTRAMEDULLARY (IM) NAIL INTERTROCHANTRIC;  Surgeon: Mauri Pole, MD;  Location: [facility];  Service: Orthopedics;  Laterality: Right;   Past Medical History  Diagnosis Date  . Paroxysmal atrial fibrillation (HCC)     chronic anticoag until 01/2013 - stopped due to high fall risk  . Right bundle branch block and incomplete left bundle branch block     with left axis deviation  . Chronic back pain     narcotic dependence  . Lumbar disc disease     dr. Trenton Gammon  . Mitral valve prolapse   . Macular degeneration   . Gastritis     RUT negative, 2005  . Diverticulosis   . Alcohol abuse   . Compression fracture 02/2011    s/p KP  . Arthritis   .  Depression   . Myocardial infarction ([place])   . COPD (chronic obstructive pulmonary disease) ([place])   . Intertrochanteric fracture of right hip ([place]) 06/26/2014  . ANXIETY DEPRESSION 10/01/2009    Qualifier: Diagnosis of  By: Niel Hummer MD, Lorinda Creed   . Dementia 01/28/2013  . Diabetes mellitus without complication ([place]) 80/99/8338  . Fall 01/21/2013  . HLD (hyperlipidemia) 05/30/2007    Qualifier: Diagnosis of  By: Niel Hummer MD, [place] Hypertension 02/20/2013  . Insomnia 01/28/2013  . Protein-calorie malnutrition, severe ([place]) 06/26/2014  . Weight loss, unintentional 02/08/2013  . VITAMIN D DEFICIENCY 03/20/2008    Qualifier: Diagnosis of  By: Cori Razor RN, Mikal Plane Tobacco abuse 06/26/2014   BP 126/77 mmHg  Pulse 60  SpO2 97%  Opioid Risk Score:   Fall Risk Score:  `1  Depression screen PHQ  2/9  Depression screen [facility] 2/9 01/26/2015 12/30/2014 10/09/2014  Decreased Interest 0 0 0  Down, Depressed, Hopeless 0 0 3  PHQ - 2 Score 0 0 3  Altered sleeping - - 0  Tired, decreased energy - - 2  Change in appetite - - 0  Feeling bad or failure about yourself  - - 1  Trouble concentrating - - 0  Moving slowly or fidgety/restless - - 3  Suicidal thoughts - - 0  PHQ-9 Score - - 9     Review of Systems  All other systems reviewed and are negative.      Objective:   Physical Exam  Constitutional: She is oriented to person, place, and time. She appears well-developed and well-nourished.  HENT:  Head: Normocephalic and atraumatic.  Eyes: Conjunctivae are normal. Pupils are equal, round, and reactive to light.  Neurological: She is alert and oriented to person, place, and time.  Psychiatric: She has a normal mood and affect.  Nursing note and vitals reviewed. Motor strength is 4/5 bilateral deltoid, biceps, triceps, grip, hip flexor, knee extensor, ankle dorsiflexor plantar flexor Knees have crepitus bilaterally she does have enlarged knee joints. No evidence of effusion Hallux valgus bilateral feet.  She has decreased sensation bilateral medial malleoli are area as well as right hallux. No evidence of skin breakdown Back has no tenderness palpation lumbar paraspinals. She does indicate that her pain is mainly in the upper lumbar area Patient with kyphoscoliosis deformities noted      Assessment & Plan:  1.Severe lumbar spinal stenosis L4-5. Her lower extremity symptoms are most likely related to this rather than a peripheral neuropathy. Her sensory deficits are mainly confined to one nurse to nerve root distributions, L4 and L5 on the right and L4 only on the left  2. Subjective complaints of foot swelling, no objective evidence of foot swelling. She does have diminished pulses, Defer to cardiology or PCP to further evaluate.  Toes are mildly cold but no evidence of cyanosis.  This is most likely a neuropathic sensation, At this point I do not think any further imaging studies are needed.  3. Chronic pain syndrome multifactorial including lumbar scoliosis, lumbar spinal stenosis, lumbar radiculopathy. We'll increase hydrocodone to 5 mg 3 times a day but I have instructed the patient not to take her Valium anymore she was taking 5 mg per day and she uses this as a bridge between hydrocodone doses. She was excessively somnolent according to her husband at home and naps frequently. She does not appear to be sedated at the current time and I have not seen her  sedated in the office.  Return to clinic one month  Also emphasized the importance of getting up every hour to ambulate in the home Over half of the 25 min visit was spent counseling and coordinating care.Discussed recommendations with the patient as well as her husband who is with her.

## 2015-03-24 LAB — PMP ALCOHOL METABOLITE (ETG): ETGU: NEGATIVE ng/mL

## 2015-03-27 ENCOUNTER — Other Ambulatory Visit: Payer: Self-pay | Admitting: Internal Medicine

## 2015-03-30 LAB — OPIATES/OPIOIDS (LC/MS-MS)
Codeine Urine: NEGATIVE ng/mL (ref <50)
HYDROCODONE: 420 ng/mL (ref <50)
Hydromorphone: NEGATIVE ng/mL — AB (ref <50)
Morphine Urine: NEGATIVE ng/mL (ref <50)
NOROXYCODONE, UR: NEGATIVE ng/mL (ref <50)
Norhydrocodone, Ur: 1373 ng/mL (ref <50)
OXYMORPHONE, URINE: NEGATIVE ng/mL (ref <50)
Oxycodone, ur: NEGATIVE ng/mL (ref <50)

## 2015-03-30 LAB — BENZODIAZEPINES (GC/LC/MS), URINE
Alprazolam metabolite (GC/LC/MS), ur confirm: NEGATIVE ng/mL (ref <25)
CLONAZEPAU: NEGATIVE ng/mL (ref <25)
Flurazepam metabolite (GC/LC/MS), ur confirm: NEGATIVE ng/mL (ref <50)
LORAZEPAMU: NEGATIVE ng/mL (ref <50)
Midazolam (GC/LC/MS), ur confirm: NEGATIVE ng/mL (ref <50)
NORDIAZEPAMU: 469 ng/mL (ref <50)
OXAZEPAMU: 2490 ng/mL (ref <50)
Temazepam (GC/LC/MS), ur confirm: 17900 ng/mL (ref <50)
Triazolam metabolite (GC/LC/MS), ur confirm: NEGATIVE ng/mL (ref <50)

## 2015-03-30 LAB — FENTANYL (GC/LC/MS), URINE
FENTANYL (GC/MS) CONFIRM: 9.7 ng/mL (ref <0.5)
NORFENTANYL (GC/MS) CONFIRM: 82.3 ng/mL (ref <0.5)

## 2015-03-31 LAB — PRESCRIPTION MONITORING PROFILE (SOLSTAS)
AMPHETAMINE/METH: NEGATIVE ng/mL
BARBITURATE SCREEN, URINE: NEGATIVE ng/mL
Buprenorphine, Urine: NEGATIVE ng/mL
CANNABINOID SCRN UR: NEGATIVE ng/mL
COCAINE METABOLITES: NEGATIVE ng/mL
CREATININE, URINE: 37.76 mg/dL (ref >20.0)
Carisoprodol, Urine: NEGATIVE ng/mL
MDMA URINE: NEGATIVE ng/mL
MEPERIDINE UR: NEGATIVE ng/mL
Methadone Screen, Urine: NEGATIVE ng/mL
Nitrites, Initial: NEGATIVE ug/mL
Oxycodone Screen, Ur: NEGATIVE ng/mL
PH URINE, INITIAL: 5.5 pH (ref 4.5–8.9)
Propoxyphene: NEGATIVE ng/mL
Tapentadol, urine: NEGATIVE ng/mL
Tramadol Scrn, Ur: NEGATIVE ng/mL
ZOLPIDEM, URINE: NEGATIVE ng/mL

## 2015-04-01 NOTE — Progress Notes (Signed)
Urine drug screen for this encounter is consistent for prescribed medication 

## 2015-04-15 ENCOUNTER — Ambulatory Visit (INDEPENDENT_AMBULATORY_CARE_PROVIDER_SITE_OTHER): Payer: Medicare Other | Admitting: Pharmacist Clinician (PhC)/ Clinical Pharmacy Specialist

## 2015-04-15 DIAGNOSIS — Z5181 Encounter for therapeutic drug level monitoring: Secondary | ICD-10-CM

## 2015-04-15 DIAGNOSIS — I480 Paroxysmal atrial fibrillation: Secondary | ICD-10-CM

## 2015-04-15 LAB — POCT INR: INR: 2.1

## 2015-04-21 ENCOUNTER — Ambulatory Visit: Payer: Medicare Other | Admitting: Physical Medicine & Rehabilitation

## 2015-04-27 ENCOUNTER — Telehealth: Payer: Self-pay

## 2015-04-27 ENCOUNTER — Telehealth: Payer: Self-pay | Admitting: Internal Medicine

## 2015-04-27 NOTE — Telephone Encounter (Signed)
C/o of increasing palpitations over the past 2 days causing her to wake up Does not know her HR during episode but feels it is just beating hard but is regular Slightly short of breath OV made with Dr. Caryl Comes for 12/14, Wednesday at 0930am

## 2015-04-27 NOTE — Telephone Encounter (Signed)
  C/o of increasing palpitations over the past 2 days causing her to wake up Does not know her HR during episode but feels it is just beating hard but is regular Slightly short of breath OV made with Dr. Caryl Comes for 12/14, Wednesday at 0930am        Routed to Dr. Caryl Comes

## 2015-04-27 NOTE — Telephone Encounter (Signed)
New message  Patient c/o Palpitations:  High priority if patient c/o lightheadedness and shortness of breath.  1. How long have you been having palpitations? Started up recently abut 2 days ago. She is waking up with them through the night. When she layed down for a nap it happened again.   2. Are you currently experiencing lightheadedness and shortness of breath? No   3. Have you checked your BP and heart rate? (document readings) No   4. Are you experiencing any other symptoms? No.  Comments: Please let the nurse know that she is taking 1 1/12 tabs of metoprolol everyday.

## 2015-04-28 ENCOUNTER — Ambulatory Visit (HOSPITAL_BASED_OUTPATIENT_CLINIC_OR_DEPARTMENT_OTHER): Payer: Medicare Other | Admitting: Physical Medicine & Rehabilitation

## 2015-04-28 ENCOUNTER — Encounter: Payer: Self-pay | Admitting: Physical Medicine & Rehabilitation

## 2015-04-28 ENCOUNTER — Encounter: Payer: Medicare Other | Attending: Physical Medicine & Rehabilitation

## 2015-04-28 VITALS — BP 125/68 | HR 63

## 2015-04-28 DIAGNOSIS — G894 Chronic pain syndrome: Secondary | ICD-10-CM | POA: Insufficient documentation

## 2015-04-28 DIAGNOSIS — M4806 Spinal stenosis, lumbar region: Secondary | ICD-10-CM | POA: Diagnosis not present

## 2015-04-28 DIAGNOSIS — Z79899 Other long term (current) drug therapy: Secondary | ICD-10-CM | POA: Insufficient documentation

## 2015-04-28 DIAGNOSIS — E1142 Type 2 diabetes mellitus with diabetic polyneuropathy: Secondary | ICD-10-CM | POA: Insufficient documentation

## 2015-04-28 DIAGNOSIS — M2010 Hallux valgus (acquired), unspecified foot: Secondary | ICD-10-CM | POA: Insufficient documentation

## 2015-04-28 DIAGNOSIS — Z5181 Encounter for therapeutic drug level monitoring: Secondary | ICD-10-CM | POA: Diagnosis not present

## 2015-04-28 DIAGNOSIS — M48061 Spinal stenosis, lumbar region without neurogenic claudication: Secondary | ICD-10-CM

## 2015-04-28 DIAGNOSIS — M5030 Other cervical disc degeneration, unspecified cervical region: Secondary | ICD-10-CM | POA: Insufficient documentation

## 2015-04-28 NOTE — Patient Instructions (Addendum)
Continue fentanyl patch 50   May use Tylenol extra strength 2 tablets twice a day in place of hydrocodone.  Please keep track of the days when you have to use the hydrocodone so we can see how many tablets you would use in a month

## 2015-04-28 NOTE — Progress Notes (Signed)
Subjective:    Patient ID: [name], female    DOB: 11/07/1932, 81 y.o.   MRN: [id number]  HPI 81 year old female with dementia and lumbar spinal stenosis. She spends much of her time in the wheelchair. She does ambulate in the home with a walker but not outside the home. She is independent with dressing needs some assistance with bathing.  She lives with her husband. She does have episodes of forgetfulness. She tried increasing hydrocodone to 5 mg 3 times a day but this resulted in excessive sedation Patient would like to reduce the dose once again to 2 tablets per day.  Husband states that she has had her prescriptions filled recently and does not need another one until next month  Pain Inventory Average Pain 5 Pain Right Now 7 My pain is constant, sharp and burning  In the last 24 hours, has pain interfered with the following? General activity 8 Relation with others 8 Enjoyment of life 10 What TIME of day is your pain at its worst? All the time Sleep (in general) Fair  Pain is worse with: NA Pain improves with: NA Relief from Meds: NA  Mobility walk with assistance use a walker use a wheelchair Do you have any goals in this area?  no  Function not employed: date last employed NA  Neuro/Psych No problems in this area  Prior Studies Any changes since last visit?  no  Physicians involved in your care Any changes since last visit?  no   Family History  Problem Relation Age of Onset  . Heart disease Mother   . Cancer Father     throat  . Atrial fibrillation Sister    Social History   Social History  . Marital Status: Married    Spouse Name: N/A  . Number of Children: N/A  . Years of Education: N/A   Social History Main Topics  . Smoking status: Current Every Day Smoker -- 0.50 packs/day for 65 years    Types: Cigarettes  . Smokeless tobacco: Never Used  . Alcohol Use: Yes     Comment: Occasional  . Drug Use: No  . Sexual Activity: No   Other  Topics Concern  . None   Social History Narrative   Past Surgical History  Procedure Laterality Date  . Right ankle surgery    . Knee arthroscopy    . Intramedullary (im) nail intertrochanteric Right 06/26/2014    Procedure: INTRAMEDULLARY (IM) NAIL INTERTROCHANTRIC;  Surgeon: Mauri Pole, MD;  Location: [facility];  Service: Orthopedics;  Laterality: Right;   Past Medical History  Diagnosis Date  . Paroxysmal atrial fibrillation (HCC)     chronic anticoag until 01/2013 - stopped due to high fall risk  . Right bundle branch block and incomplete left bundle branch block     with left axis deviation  . Chronic back pain     narcotic dependence  . Lumbar disc disease     dr. Trenton Gammon  . Mitral valve prolapse   . Macular degeneration   . Gastritis     RUT negative, 2005  . Diverticulosis   . Alcohol abuse   . Compression fracture 02/2011    s/p KP  . Arthritis   . Depression   . Myocardial infarction ([place])   . COPD (chronic obstructive pulmonary disease) ([place])   . Intertrochanteric fracture of right hip ([place]) 06/26/2014  . ANXIETY DEPRESSION 10/01/2009    Qualifier: Diagnosis of  By: Niel Hummer MD, Lorinda Creed   .  Dementia 01/28/2013  . Diabetes mellitus without complication ([place]) 123456  . Fall 01/21/2013  . HLD (hyperlipidemia) 05/30/2007    Qualifier: Diagnosis of  By: Niel Hummer MD, [place] Hypertension 02/20/2013  . Insomnia 01/28/2013  . Protein-calorie malnutrition, severe ([place]) 06/26/2014  . Weight loss, unintentional 02/08/2013  . VITAMIN D DEFICIENCY 03/20/2008    Qualifier: Diagnosis of  By: Cori Razor RN, Mikal Plane Tobacco abuse 06/26/2014   BP 125/68 mmHg  Pulse 63  SpO2 98%  Opioid Risk Score:   Fall Risk Score:  `1  Depression screen PHQ 2/9  Depression screen [facility] 2/9 01/26/2015 12/30/2014 10/09/2014  Decreased Interest 0 0 0  Down, Depressed, Hopeless 0 0 3  PHQ - 2 Score 0 0 3  Altered sleeping - - 0  Tired, decreased energy - - 2  Change in appetite - -  0  Feeling bad or failure about yourself  - - 1  Trouble concentrating - - 0  Moving slowly or fidgety/restless - - 3  Suicidal thoughts - - 0  PHQ-9 Score - - 9     Review of Systems  All other systems reviewed and are negative.      Objective:   Physical Exam  Constitutional: She is oriented to person, place, and time. She appears well-developed and well-nourished.  HENT:  Head: Normocephalic and atraumatic.  Eyes: Conjunctivae and EOM are normal. Pupils are equal, round, and reactive to light.  Neck: Normal range of motion.  Musculoskeletal:       Right foot: There is deformity.       Left foot: There is deformity.  Bilateral hallux valgus deformity  Neurological: She is alert and oriented to person, place, and time. She has normal strength. A sensory deficit is present.  Decreased sensation bilateral L4 dermatomes   Skin:  Patient has no skin lesions on both feet.  Psychiatric: She has a normal mood and affect.  Nursing note and vitals reviewed.         Assessment & Plan:  1. Lumbar spinal stenosis with chronic pain both axial and neurogenic. Does not tolerate 3 times a day hydrocodone 5 mg We will reduce hydrocodone 5 mg 1 tablet twice a day  Continue Duragesic 50 g every 72 hours  Follow up 1 month Does not need prescriptions today will need prescriptions next month

## 2015-04-29 ENCOUNTER — Ambulatory Visit: Payer: Medicare Other | Admitting: Internal Medicine

## 2015-05-01 ENCOUNTER — Ambulatory Visit: Payer: Medicare Other | Admitting: Internal Medicine

## 2015-05-12 ENCOUNTER — Telehealth: Payer: Self-pay | Admitting: *Deleted

## 2015-05-12 ENCOUNTER — Telehealth: Payer: Self-pay | Admitting: Internal Medicine

## 2015-05-12 NOTE — Telephone Encounter (Signed)
Please check with pharmacy on the fill date, patient's husband stated that they should have enough medication until next visit.

## 2015-05-12 NOTE — Telephone Encounter (Signed)
Calling for a refill on her hydrocodone.  She was just seen on 04/28/15 by Dr Letta Pate and did not receive Rx because she was not tolerating tid hydrocodone.  What about Fentalyl?  She has appt 05/26/15 with Zella Ball

## 2015-05-12 NOTE — Telephone Encounter (Signed)
New problem   Pt stated she is returning your call.

## 2015-05-12 NOTE — Telephone Encounter (Signed)
I spoke with the patient. She states she can't keep her scheduled appointment tomorrow secondary to severe back pain from her scoliosis. She is still having some heart palpitations. I advised her the next available work spot I have for her is on 05/25/15 at 12:15 pm. She is agreeable with this.

## 2015-05-13 ENCOUNTER — Ambulatory Visit: Payer: Medicare Other | Admitting: Internal Medicine

## 2015-05-13 MED ORDER — HYDROCODONE-ACETAMINOPHEN 5-325 MG PO TABS: 1.0000 | ORAL_TABLET | Freq: Two times a day (BID) | ORAL | Status: DC

## 2015-05-13 NOTE — Telephone Encounter (Signed)
Mr [name] called again about hydrocodone. He says that he told Dr [name] that they only had enough to get through to the 28th of this month.  I have checked and the fill date was 03/26/15.  She is taking only 2 per day so that a prescription for #60 will be issued and they will bring bottle to the next appt 05/26/15.  I have spoken with Dr [name] and he has agreed. [name] will sign Rx and [name] will pick up.  A CSA will need to be signed at January appt.

## 2015-05-15 ENCOUNTER — Ambulatory Visit (INDEPENDENT_AMBULATORY_CARE_PROVIDER_SITE_OTHER): Payer: Medicare Other | Admitting: Pharmacist Clinician (PhC)/ Clinical Pharmacy Specialist

## 2015-05-15 DIAGNOSIS — I480 Paroxysmal atrial fibrillation: Secondary | ICD-10-CM | POA: Diagnosis not present

## 2015-05-15 DIAGNOSIS — Z5181 Encounter for therapeutic drug level monitoring: Secondary | ICD-10-CM | POA: Diagnosis not present

## 2015-05-15 LAB — POCT INR: INR: 2.5

## 2015-05-17 ENCOUNTER — Other Ambulatory Visit: Payer: Self-pay | Admitting: Internal Medicine

## 2015-05-25 ENCOUNTER — Ambulatory Visit: Payer: Medicare Other | Admitting: Internal Medicine

## 2015-05-26 ENCOUNTER — Encounter: Payer: Medicare Other | Attending: Physical Medicine & Rehabilitation | Admitting: Registered Nurse

## 2015-05-26 ENCOUNTER — Encounter: Payer: Self-pay | Admitting: Internal Medicine

## 2015-05-26 DIAGNOSIS — M5030 Other cervical disc degeneration, unspecified cervical region: Secondary | ICD-10-CM | POA: Insufficient documentation

## 2015-05-26 DIAGNOSIS — Z5181 Encounter for therapeutic drug level monitoring: Secondary | ICD-10-CM | POA: Insufficient documentation

## 2015-05-26 DIAGNOSIS — G894 Chronic pain syndrome: Secondary | ICD-10-CM | POA: Insufficient documentation

## 2015-05-26 DIAGNOSIS — E1142 Type 2 diabetes mellitus with diabetic polyneuropathy: Secondary | ICD-10-CM | POA: Insufficient documentation

## 2015-05-26 DIAGNOSIS — M4806 Spinal stenosis, lumbar region: Secondary | ICD-10-CM | POA: Insufficient documentation

## 2015-05-26 DIAGNOSIS — Z79899 Other long term (current) drug therapy: Secondary | ICD-10-CM | POA: Insufficient documentation

## 2015-05-26 DIAGNOSIS — M2010 Hallux valgus (acquired), unspecified foot: Secondary | ICD-10-CM | POA: Insufficient documentation

## 2015-06-05 ENCOUNTER — Ambulatory Visit: Payer: Medicare Other | Admitting: Registered Nurse

## 2015-06-10 ENCOUNTER — Ambulatory Visit (INDEPENDENT_AMBULATORY_CARE_PROVIDER_SITE_OTHER): Payer: Medicare Other | Admitting: Pharmacist Clinician (PhC)/ Clinical Pharmacy Specialist

## 2015-06-10 DIAGNOSIS — Z5181 Encounter for therapeutic drug level monitoring: Secondary | ICD-10-CM | POA: Diagnosis not present

## 2015-06-10 DIAGNOSIS — I480 Paroxysmal atrial fibrillation: Secondary | ICD-10-CM | POA: Diagnosis not present

## 2015-06-10 LAB — POCT INR: INR: 2.5

## 2015-06-11 ENCOUNTER — Encounter: Payer: Self-pay | Admitting: Registered Nurse

## 2015-06-11 ENCOUNTER — Encounter (HOSPITAL_BASED_OUTPATIENT_CLINIC_OR_DEPARTMENT_OTHER): Payer: Medicare Other | Admitting: Registered Nurse

## 2015-06-11 VITALS — BP 133/52 | HR 61 | Resp 16

## 2015-06-11 DIAGNOSIS — Z79899 Other long term (current) drug therapy: Secondary | ICD-10-CM | POA: Diagnosis not present

## 2015-06-11 DIAGNOSIS — G894 Chronic pain syndrome: Secondary | ICD-10-CM | POA: Diagnosis not present

## 2015-06-11 DIAGNOSIS — M25561 Pain in right knee: Secondary | ICD-10-CM | POA: Diagnosis not present

## 2015-06-11 DIAGNOSIS — M4806 Spinal stenosis, lumbar region: Secondary | ICD-10-CM

## 2015-06-11 DIAGNOSIS — M2010 Hallux valgus (acquired), unspecified foot: Secondary | ICD-10-CM | POA: Diagnosis not present

## 2015-06-11 DIAGNOSIS — M48061 Spinal stenosis, lumbar region without neurogenic claudication: Secondary | ICD-10-CM

## 2015-06-11 DIAGNOSIS — Z5181 Encounter for therapeutic drug level monitoring: Secondary | ICD-10-CM

## 2015-06-11 DIAGNOSIS — E1142 Type 2 diabetes mellitus with diabetic polyneuropathy: Secondary | ICD-10-CM | POA: Diagnosis not present

## 2015-06-11 DIAGNOSIS — M25562 Pain in left knee: Secondary | ICD-10-CM

## 2015-06-11 DIAGNOSIS — M5030 Other cervical disc degeneration, unspecified cervical region: Secondary | ICD-10-CM | POA: Diagnosis not present

## 2015-06-11 MED ORDER — HYDROCODONE-ACETAMINOPHEN 5-325 MG PO TABS: 1.0000 | ORAL_TABLET | Freq: Two times a day (BID) | ORAL | Status: DC

## 2015-06-11 MED ORDER — FENTANYL 50 MCG/HR TD PT72: 50.0000 ug | MEDICATED_PATCH | TRANSDERMAL | Status: DC

## 2015-06-11 NOTE — Progress Notes (Signed)
Subjective:    Patient ID: [name], female    DOB: [birth year], 82 y.o.   MRN: [id number]  HPI: [name] is a 82 year old female who returns for follow up for chronic pain and medication refill. She says her pain is located in her bilateral shoulder's, entire back and bilateral knees She rates her pain 7. Her current exercise regime is chair exercises 1-2 times daily also walks with a walker in her home.   Pain Inventory Average Pain 5 Pain Right Now 7 My pain is constant, sharp and burning  In the last 24 hours, has pain interfered with the following? General activity 8 Relation with others 8 Enjoyment of life 10 What TIME of day is your pain at its worst? all Sleep (in general) Fair  Pain is worse with: NA Pain improves with: NA Relief from Meds: NA  Mobility walk without assistance use a walker use a wheelchair Do you have any goals in this area?  no  Function disabled: date disabled .  Neuro/Psych No problems in this area  Prior Studies Any changes since last visit?  no  Physicians involved in your care Any changes since last visit?  no   Family History  Problem Relation Age of Onset  . Heart disease Mother   . Cancer Father     throat  . Atrial fibrillation Sister    Social History   Social History  . Marital Status: Married    Spouse Name: N/A  . Number of Children: N/A  . Years of Education: N/A   Social History Main Topics  . Smoking status: Current Every Day Smoker -- 0.50 packs/day for 65 years    Types: Cigarettes  . Smokeless tobacco: Never Used  . Alcohol Use: Yes     Comment: Occasional  . Drug Use: No  . Sexual Activity: No   Other Topics Concern  . None   Social History Narrative   Past Surgical History  Procedure Laterality Date  . Right ankle surgery    . Knee arthroscopy    . Intramedullary (im) nail intertrochanteric Right 06/26/2014    Procedure: INTRAMEDULLARY (IM) NAIL INTERTROCHANTRIC;  Surgeon: Mauri Pole, MD;  [facility];  Service: Orthopedics;  Laterality: Right;   Past Medical History  Diagnosis Date  . Paroxysmal atrial fibrillation (HCC)     chronic anticoag until 01/2013 - stopped due to high fall risk  . Right bundle branch block and incomplete left bundle branch block     with left axis deviation  . Chronic back pain     narcotic dependence  . Lumbar disc disease     dr. Trenton Gammon  . Mitral valve prolapse   . Macular degeneration   . Gastritis     RUT negative, 2005  . Diverticulosis   . Alcohol abuse   . Compression fracture 02/2011    s/p KP  . Arthritis   . Depression   . Myocardial infarction ([place])   . COPD (chronic obstructive pulmonary disease) ([place])   . Intertrochanteric fracture of right hip ([place]) 06/26/2014  . ANXIETY DEPRESSION 10/01/2009    Qualifier: Diagnosis of  By: Niel Hummer MD, Lorinda Creed   . Dementia 01/28/2013  . Diabetes mellitus without complication ([place]) 123456  . Fall 01/21/2013  . HLD (hyperlipidemia) 05/30/2007    Qualifier: Diagnosis of  By: Niel Hummer MD, [place] Hypertension 02/20/2013  . Insomnia 01/28/2013  . Protein-calorie malnutrition, severe (  [place]) 06/26/2014  . Weight loss, unintentional 02/08/2013  . VITAMIN D DEFICIENCY 03/20/2008    Qualifier: Diagnosis of  By: Cori Razor RN, Mikal Plane Tobacco abuse 06/26/2014   BP 133/52 mmHg  Pulse 61  Resp 16  SpO2 94%  Opioid Risk Score:   Fall Risk Score:  `1  Depression screen PHQ 2/9  Depression screen [facility] 2/9 01/26/2015 12/30/2014 10/09/2014  Decreased Interest 0 0 0  Down, Depressed, Hopeless 0 0 3  PHQ - 2 Score 0 0 3  Altered sleeping - - 0  Tired, decreased energy - - 2  Change in appetite - - 0  Feeling bad or failure about yourself  - - 1  Trouble concentrating - - 0  Moving slowly or fidgety/restless - - 3  Suicidal thoughts - - 0  PHQ-9 Score - - 9     Review of Systems  All other systems reviewed and are negative.      Objective:   Physical Exam    Constitutional: She is oriented to person, place, and time. She appears well-developed and well-nourished.  HENT:  Head: Normocephalic and atraumatic.  Neck: Normal range of motion. Neck supple.  Cardiovascular: Normal rate and regular rhythm.   Pulmonary/Chest: Effort normal and breath sounds normal.  Musculoskeletal:  Normal Muscle Bulk and Muscle Testing Reveals: Upper Extremities: Full ROM and Muscle Strength 5/5 Thoracic and Lumbar Hypersensitivity Lower Extremities: Full ROM and Muscle Strength 5/5 Arrived in wheelchair  Neurological: She is alert and oriented to person, place, and time.  Skin: Skin is warm and dry.  Psychiatric: She has a normal mood and affect.  Nursing note and vitals reviewed.         Assessment & Plan:  1. Severe cervical and lumbar degenerative disc with chronic spine pain. Refilled: Fentanyl 50 mcg one patch every three days #10 and Hydrocodone 5/325 mg one tablet twice a day as needed for moderate pain. #60.  2. Lumbar spinal stenosis with nerve root compression left S1. Continue to Monitor. 3. Diabetic neuropathy severe: Continue to monitor 4. Kyphoscoliosis: Continue to Monitor / Poor Mobility 5. Bilateral Knee Pain: Ortho Following  20 minutes of face to face patient care time was spent during this visit. All questions were encouraged and answered.  F/U in 1 month

## 2015-07-09 ENCOUNTER — Encounter: Payer: Medicare Other | Attending: Physical Medicine & Rehabilitation | Admitting: Registered Nurse

## 2015-07-09 ENCOUNTER — Encounter: Payer: Self-pay | Admitting: Registered Nurse

## 2015-07-09 VITALS — BP 94/52 | HR 60 | Resp 12

## 2015-07-09 DIAGNOSIS — Z5181 Encounter for therapeutic drug level monitoring: Secondary | ICD-10-CM | POA: Insufficient documentation

## 2015-07-09 DIAGNOSIS — M2010 Hallux valgus (acquired), unspecified foot: Secondary | ICD-10-CM | POA: Insufficient documentation

## 2015-07-09 DIAGNOSIS — G894 Chronic pain syndrome: Secondary | ICD-10-CM | POA: Insufficient documentation

## 2015-07-09 DIAGNOSIS — M25561 Pain in right knee: Secondary | ICD-10-CM

## 2015-07-09 DIAGNOSIS — M4806 Spinal stenosis, lumbar region: Secondary | ICD-10-CM | POA: Insufficient documentation

## 2015-07-09 DIAGNOSIS — M25562 Pain in left knee: Secondary | ICD-10-CM

## 2015-07-09 DIAGNOSIS — M48061 Spinal stenosis, lumbar region without neurogenic claudication: Secondary | ICD-10-CM

## 2015-07-09 DIAGNOSIS — E1142 Type 2 diabetes mellitus with diabetic polyneuropathy: Secondary | ICD-10-CM | POA: Diagnosis not present

## 2015-07-09 DIAGNOSIS — M5030 Other cervical disc degeneration, unspecified cervical region: Secondary | ICD-10-CM

## 2015-07-09 DIAGNOSIS — Z79899 Other long term (current) drug therapy: Secondary | ICD-10-CM | POA: Insufficient documentation

## 2015-07-09 MED ORDER — HYDROCODONE-ACETAMINOPHEN 5-325 MG PO TABS: 1.0000 | ORAL_TABLET | Freq: Two times a day (BID) | ORAL | Status: DC

## 2015-07-09 NOTE — Progress Notes (Signed)
Subjective:    Patient ID: [name], female    DOB: 06-22-1932, 82 y.o.   MRN: [id number]  HPI: [name] is a 82 year old female who returns for follow up for chronic pain and medication refill. She says her pain is located in her bilateral shoulder's, entire back and bilateral knees. Also states she has generalizes pain all over. She rates her pain 10. Her current exercise regime is chair exercises 1-2 times daily also walks with a walker in her home.  Husband in room all questions answered.  Pain Inventory Average Pain 6 Pain Right Now 10 My pain is constant, dull and aching  In the last 24 hours, has pain interfered with the following? General activity na Relation with others na Enjoyment of life NA What TIME of day is your pain at its worst? daytime Sleep (in general) Poor  Pain is worse with: some activites Pain improves with: therapy/exercise and medication Relief from Meds: 5  Mobility ability to climb steps?  no do you drive?  no use a wheelchair  Function retired  Neuro/Psych numbness tingling dizziness anxiety  Prior Studies Any changes since last visit?  no  Physicians involved in your care Any changes since last visit?  no   Family History  Problem Relation Age of Onset  . Heart disease Mother   . Cancer Father     throat  . Atrial fibrillation Sister    Social History   Social History  . Marital Status: Married    Spouse Name: N/A  . Number of Children: N/A  . Years of Education: N/A   Social History Main Topics  . Smoking status: Current Every Day Smoker -- 0.50 packs/day for 65 years    Types: Cigarettes  . Smokeless tobacco: Never Used  . Alcohol Use: Yes     Comment: Occasional  . Drug Use: No  . Sexual Activity: No   Other Topics Concern  . None   Social History Narrative   Past Surgical History  Procedure Laterality Date  . Right ankle surgery    . Knee arthroscopy    . Intramedullary (im) nail  intertrochanteric Right 06/26/2014    Procedure: INTRAMEDULLARY (IM) NAIL INTERTROCHANTRIC;  Surgeon: Mauri Pole, MD;  Location[facility];  Service: Orthopedics;  Laterality: Right;   Past Medical History  Diagnosis Date  . Paroxysmal atrial fibrillation (HCC)     chronic anticoag until 01/2013 - stopped due to high fall risk  . Right bundle branch block and incomplete left bundle branch block     with left axis deviation  . Chronic back pain     narcotic dependence  . Lumbar disc disease     dr. Trenton Gammon  . Mitral valve prolapse   . Macular degeneration   . Gastritis     RUT negative, 2005  . Diverticulosis   . Alcohol abuse   . Compression fracture 02/2011    s/p KP  . Arthritis   . Depression   . Myocardial infarction ([place])   . COPD (chronic obstructive pulmonary disease) ([place])   . Intertrochanteric fracture of right hip ([place]) 06/26/2014  . ANXIETY DEPRESSION 10/01/2009    Qualifier: Diagnosis of  By: Niel Hummer MD, Lorinda Creed   . Dementia 01/28/2013  . Diabetes mellitus without complication ([place]) 123456  . Fall 01/21/2013  . HLD (hyperlipidemia) 05/30/2007    Qualifier: Diagnosis of  By: Niel Hummer MD, [place] Hypertension 02/20/2013  .  Insomnia 01/28/2013  . Protein-calorie malnutrition, severe ([place]) 06/26/2014  . Weight loss, unintentional 02/08/2013  . VITAMIN D DEFICIENCY 03/20/2008    Qualifier: Diagnosis of  By: Cori Razor RN, Mikal Plane Tobacco abuse 06/26/2014   BP 101/44 mmHg  Pulse 58  Resp 12  SpO2 97%  Opioid Risk Score:   Fall Risk Score:  `1  Depression screen PHQ 2/9  Depression screen [facility] 2/9 01/26/2015 12/30/2014 10/09/2014  Decreased Interest 0 0 0  Down, Depressed, Hopeless 0 0 3  PHQ - 2 Score 0 0 3  Altered sleeping - - 0  Tired, decreased energy - - 2  Change in appetite - - 0  Feeling bad or failure about yourself  - - 1  Trouble concentrating - - 0  Moving slowly or fidgety/restless - - 3  Suicidal thoughts - - 0  PHQ-9 Score - - 9      Review of Systems  Neurological: Positive for numbness.       Tingling   Psychiatric/Behavioral: Positive for dysphoric mood. The patient is nervous/anxious.   All other systems reviewed and are negative.      Objective:   Physical Exam  Constitutional: She is oriented to person, place, and time. She appears well-developed and well-nourished.  HENT:  Head: Normocephalic and atraumatic.  Neck: Normal range of motion. Neck supple.  Cervical Paraspinal Tenderness: C-5- C-6  Cardiovascular: Normal rate and regular rhythm.   Pulmonary/Chest: Effort normal and breath sounds normal.  Musculoskeletal:  Normal Muscle Bulk and Muscle testing Reveals: Upper Extremities: Full ROM and Muscle Strength 5/5 Thoracic Paraspinal Tenderness: T-1- T-3 Lumbar Paraspinal Tenderness: L-3- L-5 Lower Extremities: Full ROM and Muscle Strength 5/5 Arrived in wheelchair  Neurological: She is alert and oriented to person, place, and time.  Skin: Skin is warm and dry.  Psychiatric: She has a normal mood and affect.  Nursing note and vitals reviewed.         Assessment & Plan:  1. Severe cervical and lumbar degenerative disc with chronic spine pain. Continue: Fentanyl 50 mcg one patch every three days #10 ( has a prescription at the pharmacy) and Refilled: Hydrocodone 5/325 mg one tablet twice a day as needed for moderate pain. #60.  2. Lumbar spinal stenosis with nerve root compression left S1. Continue to Monitor. 3. Diabetic neuropathy severe: Continue to monitor 4. Kyphoscoliosis: Continue to Monitor / Poor Mobility 5. Bilateral Knee Pain: Ortho Following  20 minutes of face to face patient care time was spent during this visit. All questions were encouraged and answered.  F/U in 1 month

## 2015-07-21 ENCOUNTER — Ambulatory Visit (INDEPENDENT_AMBULATORY_CARE_PROVIDER_SITE_OTHER): Payer: Medicare Other | Admitting: Pharmacist Clinician (PhC)/ Clinical Pharmacy Specialist

## 2015-07-21 DIAGNOSIS — I480 Paroxysmal atrial fibrillation: Secondary | ICD-10-CM | POA: Diagnosis not present

## 2015-07-21 DIAGNOSIS — Z5181 Encounter for therapeutic drug level monitoring: Secondary | ICD-10-CM | POA: Diagnosis not present

## 2015-07-21 LAB — POCT INR: INR: 1.7

## 2015-07-31 ENCOUNTER — Ambulatory Visit (INDEPENDENT_AMBULATORY_CARE_PROVIDER_SITE_OTHER): Payer: Medicare Other | Admitting: Internal Medicine

## 2015-07-31 ENCOUNTER — Encounter: Payer: Self-pay | Admitting: Internal Medicine

## 2015-07-31 VITALS — BP 122/70 | HR 77 | Ht 64.0 in | Wt 95.2 lb

## 2015-07-31 DIAGNOSIS — I480 Paroxysmal atrial fibrillation: Secondary | ICD-10-CM

## 2015-07-31 MED ORDER — METOPROLOL TARTRATE 25 MG PO TABS: 25.0000 mg | ORAL_TABLET | Freq: Two times a day (BID) | ORAL | Status: AC

## 2015-07-31 NOTE — Addendum Note (Signed)
Addended by: Stanton Kidney on: 07/31/2015 01:37 PM   Modules accepted: Orders

## 2015-07-31 NOTE — Patient Instructions (Addendum)
Medication Instructions:  Your physician recommends that you continue on your current medications as directed. Please refer to the Current Medication list given to you today.  Labwork: None ordered  Testing/Procedures: None ordered  Follow-Up: Your physician wants you to follow-up in: 6 months with Dr. Caryl Comes. You will receive a reminder letter in the mail two months in advance. If you don't receive a letter, please call our office to schedule the follow-up appointment.   If you need a refill on your cardiac medications before your next appointment, please call your pharmacy.  Thank you for choosing [facility]!!     Any Other Special Instructions Will Be Listed Below (If Applicable). - You may take an one extra Verapamil and/or Metoprolol for AFib episode

## 2015-07-31 NOTE — Progress Notes (Signed)
Patient Care Team: Carol Ada, MD as PCP - General (Family Medicine) Deboraha Sprang, MD (Cardiology) Hurman Horn, MD (Ophthalmology)   HPI  [name] is a 83 y.o. female Seen in follow-up for atrial fibrillation. She was last seen 2011 by me; she was seen by LG7/16. Her note recounts an echocardiogram 2011 with an EF of 55-60% with significant back some degree of MS and mild MR. She had been on Coumadin until 9/14 when because of a fall Coumadin was discontinued.  The fall occurred when her feet got caught her pajamas while in the bathroom  She has struggled with chronic narcotic use ;  She still has pain   She is in a wheelchair  Finally resolved to take coumadin 2/2 cost issues;  They are selling their home  She continues to struggle with her husband verbally  Some recurrent atrial fibrillation  She addressed by stopping smoking in the am with great benefit Records and Results Reviewed hospital  Past Medical History  Diagnosis Date  . Paroxysmal atrial fibrillation (HCC)     chronic anticoag until 01/2013 - stopped due to high fall risk  . Right bundle branch block and incomplete left bundle branch block     with left axis deviation  . Chronic back pain     narcotic dependence  . Lumbar disc disease     dr. Trenton Gammon  . Mitral valve prolapse   . Macular degeneration   . Gastritis     RUT negative, 2005  . Diverticulosis   . Alcohol abuse   . Compression fracture 02/2011    s/p KP  . Arthritis   . Depression   . Myocardial infarction ([place])   . COPD (chronic obstructive pulmonary disease) ([place])   . Intertrochanteric fracture of right hip ([place]) 06/26/2014  . ANXIETY DEPRESSION 10/01/2009    Qualifier: Diagnosis of  By: Niel Hummer MD, Lorinda Creed   . Dementia 01/28/2013  . Diabetes mellitus without complication ([place]) 123456  . Fall 01/21/2013  . HLD (hyperlipidemia) 05/30/2007    Qualifier: Diagnosis of  By: Niel Hummer MD, [place] Hypertension 02/20/2013    . Insomnia 01/28/2013  . Protein-calorie malnutrition, severe ([place]) 06/26/2014  . Weight loss, unintentional 02/08/2013  . VITAMIN D DEFICIENCY 03/20/2008    Qualifier: Diagnosis of  By: Cori Razor RN, Mikal Plane Tobacco abuse 06/26/2014    Past Surgical History  Procedure Laterality Date  . Right ankle surgery    . Knee arthroscopy    . Intramedullary (im) nail intertrochanteric Right 06/26/2014    Procedure: INTRAMEDULLARY (IM) NAIL INTERTROCHANTRIC;  Surgeon: Mauri Pole, MD;  Location: [facility];  Service: Orthopedics;  Laterality: Right;    Current Outpatient Prescriptions  Medication Sig Dispense Refill  . b complex vitamins tablet Take 1 tablet by mouth daily.    . Calcium Carb-Cholecalciferol (CALCIUM 600-D PO) Take 600 mg by mouth daily.    . Cholecalciferol (VITAMIN D) 1000 UNITS capsule Take 1,200 Units by mouth daily.     . diazepam (VALIUM) 5 MG tablet Take 1 tablet (5 mg total) by mouth every 6 (six) hours as needed. for anxiety 30 tablet 0  . fentaNYL (DURAGESIC - DOSED MCG/HR) 50 MCG/HR Place 1 patch (50 mcg total) onto the skin every 3 (three) days. 10 patch 0  . HYDROcodone-acetaminophen (NORCO/VICODIN) 5-325 MG tablet Take 1 tablet by mouth 2 (two) times daily. prn 60 tablet 0  .  memantine (NAMENDA) 10 MG tablet Take 10 mg by mouth 2 (two) times daily.  0  . metFORMIN (GLUCOPHAGE) 500 MG tablet Take 1 tablet (500 mg total) by mouth daily with breakfast. 90 tablet 2  . Multiple Vitamin (MULTIVITAMIN WITH MINERALS) TABS tablet Take 1 tablet by mouth daily.    Marland Kitchen PARoxetine (PAXIL) 40 MG tablet TAKE 1 TABLET (40 MG TOTAL) BY MOUTH EVERY MORNING. 30 tablet 5  . polyethylene glycol powder (GLYCOLAX/MIRALAX) powder Take 17 g by mouth daily. 3350 g 1  . pravastatin (PRAVACHOL) 80 MG tablet TAKE 1 TABLET (80 MG TOTAL) BY MOUTH DAILY. 30 tablet 10  . temazepam (RESTORIL) 30 MG capsule Take 30 mg by mouth at bedtime as needed for sleep (sleep).    . verapamil (CALAN-SR) 180 MG CR  tablet TAKE ONE TABLET BY MOUTH TWICE DAILY 60 tablet 5  . warfarin (COUMADIN) 5 MG tablet TAKE AS DIRECTED BY COUMADIN CLINIC 30 tablet 3   No current facility-administered medications for this visit.    Allergies  Allergen Reactions  . Codeine Other (See Comments)    unknown  . Morphine And Related Other (See Comments)    Pt had severe respiratory depression and excessive sedation during a recent hospitalization and was told it was 2/2 morphine.      Review of Systems negative except from HPI and PMH  Physical Exam BP 122/70 mmHg  Pulse 77  Ht 5\' 4"  (1.626 m)  Wt 95 lb 3.2 oz (43.182 kg)  BMI 16.33 kg/m2 Well developed and well nourished in no acute distress HENT normal E scleral and icterus clear Neck Supple JVP flat; carotids brisk and full Clear to ausculation  Regular rate and rhythm, no murmurs gallops or rub Soft with active bowel sounds No clubbing cyanosis Trace Edema Alert and oriented, grossly normal motor and sensory function Skin Warm and Dry    Assessment and  Plan  Atrial fibrillation-paroxysmal  Fall risk  Mitral valvular disease with a calcified and restricted mitral valve leaflets without  Stenosis  Anemia  This patients CHA2DS2-VASc Score and unadjusted Ischemic Stroke Rate (% per year) is equal to 7.2 % stroke rate/year from a score of 5  Above score calculated as 1 point each if present [CHF, HTN, DM, Vascular=MI/PAD/Aortic Plaque, Age if 65-74, or Female] Above score calculated as 2 points each if present [Age > 75, or Stroke/TIA/TE]  She continues to have some paroxysms. We will need to clarify her medications. According to emergency verapamil and metoprolol will be MAR suggested discontinuing 10/16.  whe will continue on amiodarone

## 2015-07-31 NOTE — Addendum Note (Signed)
Addended by: Stanton Kidney on: 07/31/2015 01:33 PM   Modules accepted: Medications

## 2015-08-06 ENCOUNTER — Encounter: Payer: Medicare Other | Admitting: Registered Nurse

## 2015-08-11 ENCOUNTER — Ambulatory Visit (INDEPENDENT_AMBULATORY_CARE_PROVIDER_SITE_OTHER): Payer: Medicare Other | Admitting: Pharmacist Clinician (PhC)/ Clinical Pharmacy Specialist

## 2015-08-11 DIAGNOSIS — Z5181 Encounter for therapeutic drug level monitoring: Secondary | ICD-10-CM | POA: Diagnosis not present

## 2015-08-11 DIAGNOSIS — I480 Paroxysmal atrial fibrillation: Secondary | ICD-10-CM | POA: Diagnosis not present

## 2015-08-11 LAB — POCT INR: INR: 3.8

## 2015-08-13 ENCOUNTER — Encounter (HOSPITAL_COMMUNITY): Payer: Self-pay | Admitting: Emergency Medicine

## 2015-08-13 ENCOUNTER — Emergency Department (HOSPITAL_COMMUNITY): Payer: Medicare Other

## 2015-08-13 ENCOUNTER — Emergency Department (HOSPITAL_COMMUNITY)
Admission: EM | Admit: 2015-08-13 | Discharge: 2015-08-13 | Disposition: A | Discharge status: Home / Self Care | Payer: Medicare Other | Attending: Emergency Medicine | Admitting: Emergency Medicine

## 2015-08-13 ENCOUNTER — Telehealth: Payer: Self-pay | Admitting: Registered Nurse

## 2015-08-13 ENCOUNTER — Encounter: Payer: Medicare Other | Admitting: Registered Nurse

## 2015-08-13 DIAGNOSIS — M1990 Unspecified osteoarthritis, unspecified site: Secondary | ICD-10-CM | POA: Insufficient documentation

## 2015-08-13 DIAGNOSIS — I10 Essential (primary) hypertension: Secondary | ICD-10-CM | POA: Diagnosis not present

## 2015-08-13 DIAGNOSIS — E559 Vitamin D deficiency, unspecified: Secondary | ICD-10-CM | POA: Diagnosis not present

## 2015-08-13 DIAGNOSIS — Z7984 Long term (current) use of oral hypoglycemic drugs: Secondary | ICD-10-CM | POA: Diagnosis not present

## 2015-08-13 DIAGNOSIS — I252 Old myocardial infarction: Secondary | ICD-10-CM | POA: Diagnosis not present

## 2015-08-13 DIAGNOSIS — F0390 Unspecified dementia without behavioral disturbance: Secondary | ICD-10-CM | POA: Insufficient documentation

## 2015-08-13 DIAGNOSIS — J449 Chronic obstructive pulmonary disease, unspecified: Secondary | ICD-10-CM | POA: Insufficient documentation

## 2015-08-13 DIAGNOSIS — F418 Other specified anxiety disorders: Secondary | ICD-10-CM | POA: Diagnosis not present

## 2015-08-13 DIAGNOSIS — R4182 Altered mental status, unspecified: Secondary | ICD-10-CM | POA: Diagnosis present

## 2015-08-13 DIAGNOSIS — F17210 Nicotine dependence, cigarettes, uncomplicated: Secondary | ICD-10-CM | POA: Insufficient documentation

## 2015-08-13 DIAGNOSIS — G4700 Insomnia, unspecified: Secondary | ICD-10-CM | POA: Insufficient documentation

## 2015-08-13 DIAGNOSIS — G8929 Other chronic pain: Secondary | ICD-10-CM | POA: Insufficient documentation

## 2015-08-13 DIAGNOSIS — R531 Weakness: Secondary | ICD-10-CM | POA: Insufficient documentation

## 2015-08-13 DIAGNOSIS — E785 Hyperlipidemia, unspecified: Secondary | ICD-10-CM | POA: Diagnosis not present

## 2015-08-13 DIAGNOSIS — Z79899 Other long term (current) drug therapy: Secondary | ICD-10-CM | POA: Diagnosis not present

## 2015-08-13 DIAGNOSIS — Z7901 Long term (current) use of anticoagulants: Secondary | ICD-10-CM | POA: Insufficient documentation

## 2015-08-13 DIAGNOSIS — Z8719 Personal history of other diseases of the digestive system: Secondary | ICD-10-CM | POA: Diagnosis not present

## 2015-08-13 DIAGNOSIS — E119 Type 2 diabetes mellitus without complications: Secondary | ICD-10-CM | POA: Diagnosis not present

## 2015-08-13 DIAGNOSIS — I480 Paroxysmal atrial fibrillation: Secondary | ICD-10-CM | POA: Diagnosis not present

## 2015-08-13 DIAGNOSIS — Z8781 Personal history of (healed) traumatic fracture: Secondary | ICD-10-CM | POA: Insufficient documentation

## 2015-08-13 LAB — URINALYSIS, ROUTINE W REFLEX MICROSCOPIC
Bilirubin Urine: NEGATIVE
GLUCOSE, UA: NEGATIVE mg/dL
Ketones, ur: NEGATIVE mg/dL
Leukocytes, UA: NEGATIVE
Nitrite: NEGATIVE
PROTEIN: NEGATIVE mg/dL
SPECIFIC GRAVITY, URINE: 1.008 (ref 1.005–1.030)
pH: 5.5 (ref 5.0–8.0)

## 2015-08-13 LAB — URINE MICROSCOPIC-ADD ON
Bacteria, UA: NONE SEEN
WBC, UA: NONE SEEN WBC/hpf (ref 0–5)

## 2015-08-13 LAB — COMPREHENSIVE METABOLIC PANEL
ALK PHOS: 52 U/L (ref 38–126)
ALT: 17 U/L (ref 14–54)
AST: 27 U/L (ref 15–41)
Albumin: 4.3 g/dL (ref 3.5–5.0)
Anion gap: 8 (ref 5–15)
BILIRUBIN TOTAL: 0.6 mg/dL (ref 0.3–1.2)
BUN: 16 mg/dL (ref 6–20)
CHLORIDE: 100 mmol/L — AB (ref 101–111)
CO2: 30 mmol/L (ref 22–32)
CREATININE: 0.51 mg/dL (ref 0.44–1.00)
Calcium: 9.4 mg/dL (ref 8.9–10.3)
GFR calc Af Amer: >60 mL/min (ref >60)
Glucose, Bld: 84 mg/dL (ref 65–99)
Potassium: 4.5 mmol/L (ref 3.5–5.1)
Sodium: 138 mmol/L (ref 135–145)
Total Protein: 7.1 g/dL (ref 6.5–8.1)

## 2015-08-13 LAB — I-STAT CG4 LACTIC ACID, ED: Lactic Acid, Venous: 1.29 mmol/L (ref 0.5–2.0)

## 2015-08-13 LAB — CBC
HCT: 42.1 % (ref 36.0–46.0)
Hemoglobin: 13.9 g/dL (ref 12.0–15.0)
MCH: 35 pg — ABNORMAL HIGH (ref 26.0–34.0)
MCHC: 33 g/dL (ref 30.0–36.0)
MCV: 106 fL — AB (ref 78.0–100.0)
PLATELETS: 205 10*3/uL (ref 150–400)
RBC: 3.97 MIL/uL (ref 3.87–5.11)
RDW: 12.6 % (ref 11.5–15.5)
WBC: 6.7 10*3/uL (ref 4.0–10.5)

## 2015-08-13 LAB — CBG MONITORING, ED: GLUCOSE-CAPILLARY: 72 mg/dL (ref 65–99)

## 2015-08-13 LAB — PROTIME-INR
INR: 2.22 — AB (ref 0.00–1.49)
Prothrombin Time: 24.4 seconds — ABNORMAL HIGH (ref 11.6–15.2)

## 2015-08-13 MED ORDER — SODIUM CHLORIDE 0.9 % IV BOLUS (SEPSIS)
500.0000 mL | Freq: Once | INTRAVENOUS | Status: AC
  Administered 2015-08-13: 500 mL via INTRAVENOUS

## 2015-08-13 MED ORDER — SODIUM CHLORIDE 0.9 % IV SOLN
INTRAVENOUS | Status: DC
  Administered 2015-08-13: 16:00:00 via INTRAVENOUS

## 2015-08-13 NOTE — ED Notes (Signed)
Pt's husband, [name], cell phone: [phone].

## 2015-08-13 NOTE — ED Notes (Signed)
In and out cath unsuccessful PA made aware

## 2015-08-13 NOTE — ED Provider Notes (Signed)
CSN: [id number]     Arrival date & time 08/13/15  1312 History   First MD Initiated Contact with Patient 08/13/15 1519     Chief Complaint  Patient presents with  . Altered Mental Status    HX dementia     (Consider location/radiation/quality/duration/timing/severity/associated sxs/prior Treatment) HPI Patient presents to the emergency department with some generalized weakness and some disorientation over the last couple days.  Patient was advised that a PCP to come here for evaluation of these symptoms.  The patient states that she has no other complaints.  The patient is a current smoker.  The patient denies anything makes her condition better or worse.The patient denies chest pain, shortness of breath, headache,blurred vision, neck pain, fever, cough, numbness, dizziness, anorexia, edema, abdominal pain, nausea, vomiting, diarrhea, rash, back pain, dysuria, hematemesis, bloody stool, near syncope, or syncope. Past Medical History  Diagnosis Date  . Paroxysmal atrial fibrillation (HCC)     chronic anticoag until 01/2013 - stopped due to high fall risk  . Right bundle branch block and incomplete left bundle branch block     with left axis deviation  . Chronic back pain     narcotic dependence  . Lumbar disc disease     dr. Trenton Gammon  . Mitral valve prolapse   . Macular degeneration   . Gastritis     RUT negative, 2005  . Diverticulosis   . Alcohol abuse   . Compression fracture 02/2011    s/p KP  . Arthritis   . Depression   . Myocardial infarction ([place])   . COPD (chronic obstructive pulmonary disease) ([place])   . Intertrochanteric fracture of right hip ([place]) 06/26/2014  . ANXIETY DEPRESSION 10/01/2009    Qualifier: Diagnosis of  By: Niel Hummer MD, Lorinda Creed   . Dementia 01/28/2013  . Diabetes mellitus without complication ([place]) 123456  . Fall 01/21/2013  . HLD (hyperlipidemia) 05/30/2007    Qualifier: Diagnosis of  By: Niel Hummer MD, [place] Hypertension 02/20/2013  .  Insomnia 01/28/2013  . Protein-calorie malnutrition, severe ([place]) 06/26/2014  . Weight loss, unintentional 02/08/2013  . VITAMIN D DEFICIENCY 03/20/2008    Qualifier: Diagnosis of  By: Cori Razor RN, Mikal Plane Tobacco abuse 06/26/2014   Past Surgical History  Procedure Laterality Date  . Right ankle surgery    . Knee arthroscopy    . Intramedullary (im) nail intertrochanteric Right 06/26/2014    Procedure: INTRAMEDULLARY (IM) NAIL INTERTROCHANTRIC;  Surgeon: Mauri Pole, MD;  Location: [facility];  Service: Orthopedics;  Laterality: Right;   Family History  Problem Relation Age of Onset  . Heart disease Mother   . Cancer Father     throat  . Atrial fibrillation Sister    Social History  Substance Use Topics  . Smoking status: Current Every Day Smoker -- 0.50 packs/day for 65 years    Types: Cigarettes  . Smokeless tobacco: Never Used  . Alcohol Use: Yes     Comment: Occasional   OB History    No data available     Review of Systems   All other systems negative except as documented in the HPI. All pertinent positives and negatives as reviewed in the HPI. Allergies  Codeine and Morphine and related  Home Medications   Prior to Admission medications   Medication Sig Start Date End Date Taking? Authorizing Provider  acetaminophen (TYLENOL) 500 MG tablet Take 500 mg by mouth every 8 (eight) hours as needed  for moderate pain or headache.   Yes Historical Provider, MD  b complex vitamins tablet Take 1 tablet by mouth daily.   Yes Historical Provider, MD  Calcium Carb-Cholecalciferol (CALCIUM 600-D PO) Take 600 mg by mouth daily.   Yes Historical Provider, MD  Cholecalciferol (VITAMIN D) 1000 UNITS capsule Take 1,200 Units by mouth daily.    Yes Historical Provider, MD  diazepam (VALIUM) 5 MG tablet Take 1 tablet (5 mg total) by mouth every 6 (six) hours as needed. for anxiety 06/30/14  Yes Costin Karlyne Greenspan, MD  fentaNYL (DURAGESIC - DOSED MCG/HR) 50 MCG/HR Place 1 patch (50 mcg total)  onto the skin every 3 (three) days. 06/11/15  Yes Bayard Hugger, NP  HYDROcodone-acetaminophen (NORCO) 7.5-325 MG tablet Take 1 tablet by mouth every 6 (six) hours as needed for moderate pain.   Yes Historical Provider, MD  HYDROcodone-acetaminophen (NORCO/VICODIN) 5-325 MG tablet Take 1 tablet by mouth 2 (two) times daily. prn 07/09/15  Yes Bayard Hugger, NP  memantine (NAMENDA) 10 MG tablet Take 10 mg by mouth 2 (two) times daily. 06/09/15  Yes Historical Provider, MD  metFORMIN (GLUCOPHAGE) 500 MG tablet Take 1 tablet (500 mg total) by mouth daily with breakfast. Patient taking differently: Take 500 mg by mouth 2 (two) times daily.  01/08/14  Yes Rowe Clack, MD  metoprolol tartrate (LOPRESSOR) 25 MG tablet Take 1 tablet (25 mg total) by mouth 2 (two) times daily. 07/31/15  Yes Deboraha Sprang, MD  Multiple Vitamin (MULTIVITAMIN WITH MINERALS) TABS tablet Take 1 tablet by mouth daily. 01/25/13  Yes Erline Hau, MD  PARoxetine (PAXIL) 40 MG tablet TAKE 1 TABLET (40 MG TOTAL) BY MOUTH EVERY MORNING. 12/10/13  Yes Rowe Clack, MD  polyethylene glycol powder (GLYCOLAX/MIRALAX) powder Take 17 g by mouth daily. Patient taking differently: Take 17 g by mouth daily as needed for mild constipation.  02/08/13  Yes Rowe Clack, MD  pravastatin (PRAVACHOL) 80 MG tablet TAKE 1 TABLET (80 MG TOTAL) BY MOUTH DAILY. 05/20/15  Yes Deboraha Sprang, MD  temazepam (RESTORIL) 30 MG capsule Take 30 mg by mouth at bedtime as needed for sleep (sleep).   Yes Historical Provider, MD  verapamil (CALAN) 120 MG tablet Take 120 mg by mouth 2 (two) times daily. 07/24/15  Yes Historical Provider, MD  warfarin (COUMADIN) 5 MG tablet TAKE AS DIRECTED BY COUMADIN CLINIC Patient taking differently: On Mon,Wed,Fri take 2.5mg , Tues,Thurs,Sat,Sun take 5mg s daily 03/27/15  Yes Deboraha Sprang, MD   BP 154/75 mmHg  Pulse 56  Temp(Src) 97.6 F (36.4 C) (Oral)  Resp 14  SpO2 92% Physical Exam   Constitutional: She appears well-developed and well-nourished. No distress.  HENT:  Head: Normocephalic and atraumatic.  Mouth/Throat: Oropharynx is clear and moist.  Eyes: Pupils are equal, round, and reactive to light.  Neck: Normal range of motion. Neck supple.  Cardiovascular: Normal rate, regular rhythm and normal heart sounds.  Exam reveals no gallop and no friction rub.   No murmur heard. Pulmonary/Chest: Effort normal and breath sounds normal. No respiratory distress. She has no wheezes.  Abdominal: Soft. Bowel sounds are normal. She exhibits no distension. There is no tenderness.  Musculoskeletal: She exhibits no edema.  Neurological: She is alert. She exhibits normal muscle tone. Coordination normal.  Skin: Skin is warm and dry. No rash noted. No erythema.  Psychiatric: She has a normal mood and affect. Her behavior is normal.  Nursing note and vitals reviewed.  ED Course  Procedures (including critical care time) Labs Review Labs Reviewed  COMPREHENSIVE METABOLIC PANEL - Abnormal; Notable for the following:    Chloride 100 (*)    All other components within normal limits  CBC - Abnormal; Notable for the following:    MCV 106.0 (*)    MCH 35.0 (*)    All other components within normal limits  PROTIME-INR - Abnormal; Notable for the following:    Prothrombin Time 24.4 (*)    INR 2.22 (*)    All other components within normal limits  URINALYSIS, ROUTINE W REFLEX MICROSCOPIC (NOT AT [facility]) - Abnormal; Notable for the following:    Hgb urine dipstick SMALL (*)    All other components within normal limits  URINE MICROSCOPIC-ADD ON - Abnormal; Notable for the following:    Squamous Epithelial / LPF 0-5 (*)    All other components within normal limits  URINE CULTURE  CBG MONITORING, ED  I-STAT CG4 LACTIC ACID, ED    Imaging Review Dg Chest 2 View  08/13/2015  CLINICAL DATA:  Weakness.  Disorientation. EXAM: CHEST  2 VIEW COMPARISON:  06/26/2014 FINDINGS: Cardiac  silhouette is borderline enlarged. No mediastinal or hilar masses or convincing adenopathy. Lungs are clear.  No pleural effusion or pneumothorax. Bony thorax is demineralized. There are chronic changes from previous upper lower thoracic spine vertebroplasty. IMPRESSION: No acute cardiopulmonary disease. Electronically Signed   By: Lajean Manes M.D.   On: 08/13/2015 20:16   Ct Head Wo Contrast  08/13/2015  CLINICAL DATA:  82 year old female with altered mental status since the middle of the night. Initial encounter. EXAM: CT HEAD WITHOUT CONTRAST TECHNIQUE: Contiguous axial images were obtained from the base of the skull through the vertex without intravenous contrast. COMPARISON:  Head CTs without contrast 06/26/2014 and earlier. Brain MRI 09/16/2009. FINDINGS: Chronic mild right sphenoid sinus opacification has not significantly changed. Other Visualized paranasal sinuses and mastoids are clear. Osteopenia. No acute osseous abnormality identified. No acute orbit or scalp soft tissue findings. Calcified atherosclerosis at the skull base. No midline shift, ventriculomegaly, mass effect, evidence of mass lesion, intracranial hemorrhage or evidence of cortically based acute infarction. Gray-white matter differentiation is within normal limits throughout the brain. Mild dystrophic basal ganglia calcifications again noted. No suspicious intracranial vascular hyperdensity. IMPRESSION: Stable and normal for age noncontrast CT appearance of the brain. Electronically Signed   By: Genevie Ann M.D.   On: 08/13/2015 15:25   I have personally reviewed and evaluated these images and lab results as part of my medical decision-making.   EKG Interpretation   Date/Time:  Thursday August 13 2015 14:46:02 EDT Ventricular Rate:  87 PR Interval:  150 QRS Duration: 134 QT Interval:  457 QTC Calculation: 550 R Axis:   -48 Text Interpretation:  Sinus rhythm Ventricular premature complex Left  bundle branch block No  significant change since last tracing Confirmed by  Gerald Leitz ([id number]) on 08/13/2015 2:51:00 PM      The patient currently does not have any identifiable abnormalities that would explain her generalized weakness and intermittent disorientation.  Advised patient that she will need to be followed up by her primary care doctor.  Patient agrees the plan and all questions were answered.  Patient does not have any focal neurological deficits and is ambulating without significant difficulty.  I advised the patient that close follow-up will be important to help elicit any further causes of her symptoms    Dalia Heading, PA-C 08/14/15 0122  Ovid Curd  Alvino Chapel, MD 08/15/15 1544

## 2015-08-13 NOTE — ED Notes (Signed)
This Probation officer in to go over discharge summary and visitor with patient states "I have to go to the airport to pick someone up. 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 I can't take her with me. I'll be back at 10pm."

## 2015-08-13 NOTE — ED Notes (Signed)
Per family pt woke up in middle of night disoriented , and weakness yet husband reports that pt is "always feeble". Also reports dementia. Family called PCP who sent them to ER for possible dehydration or TIA. Pt is alert and oriented x 4 upon assessment. Normal speech, no obvious neuro deficit upon assessment.

## 2015-08-13 NOTE — ED Notes (Signed)
Pt given an orange juice d/t blood sugar being 72.

## 2015-08-13 NOTE — ED Notes (Signed)
Per family report, pt's O2 saturation generally around 90-91% on RA.  Pt placed on 2L [place] O2 to help pt improve her O2.

## 2015-08-13 NOTE — ED Notes (Signed)
This Probation officer attempted x2 to obtain urine by in and out cath. Patient keeps tensing up, unable to obtain specimen.

## 2015-08-13 NOTE — Discharge Instructions (Signed)
Return here as needed.  Follow-up with your primary care doctor °

## 2015-08-14 ENCOUNTER — Encounter: Payer: Medicare Other | Attending: Physical Medicine & Rehabilitation | Admitting: Registered Nurse

## 2015-08-14 ENCOUNTER — Encounter: Payer: Self-pay | Admitting: Registered Nurse

## 2015-08-14 VITALS — BP 107/56 | HR 66 | Resp 14

## 2015-08-14 DIAGNOSIS — M4806 Spinal stenosis, lumbar region: Secondary | ICD-10-CM | POA: Insufficient documentation

## 2015-08-14 DIAGNOSIS — M25562 Pain in left knee: Secondary | ICD-10-CM

## 2015-08-14 DIAGNOSIS — M5030 Other cervical disc degeneration, unspecified cervical region: Secondary | ICD-10-CM | POA: Insufficient documentation

## 2015-08-14 DIAGNOSIS — M48061 Spinal stenosis, lumbar region without neurogenic claudication: Secondary | ICD-10-CM

## 2015-08-14 DIAGNOSIS — Z79899 Other long term (current) drug therapy: Secondary | ICD-10-CM | POA: Diagnosis not present

## 2015-08-14 DIAGNOSIS — G894 Chronic pain syndrome: Secondary | ICD-10-CM | POA: Insufficient documentation

## 2015-08-14 DIAGNOSIS — M25561 Pain in right knee: Secondary | ICD-10-CM | POA: Diagnosis not present

## 2015-08-14 DIAGNOSIS — Z5181 Encounter for therapeutic drug level monitoring: Secondary | ICD-10-CM | POA: Diagnosis not present

## 2015-08-14 DIAGNOSIS — E1142 Type 2 diabetes mellitus with diabetic polyneuropathy: Secondary | ICD-10-CM | POA: Insufficient documentation

## 2015-08-14 DIAGNOSIS — M2010 Hallux valgus (acquired), unspecified foot: Secondary | ICD-10-CM | POA: Insufficient documentation

## 2015-08-14 LAB — URINE CULTURE: CULTURE: NO GROWTH

## 2015-08-14 MED ORDER — HYDROCODONE-ACETAMINOPHEN 5-325 MG PO TABS: 1.0000 | ORAL_TABLET | Freq: Two times a day (BID) | ORAL | Status: DC

## 2015-08-14 MED ORDER — FENTANYL 50 MCG/HR TD PT72: 50.0000 ug | MEDICATED_PATCH | TRANSDERMAL | Status: DC

## 2015-08-14 NOTE — Progress Notes (Signed)
Subjective:    Patient ID: [name], female    DOB: 1932-07-30, 82 y.o.   MRN: [id number]  HPI: [name] is a 82 year old female who returns for follow up for chronic pain and medication refill. She states her pain is located in her entire back and bilateral knees. Also states she has generalizes pain all over. She rates her pain 7. Her current exercise regime is chair exercises 1-2 times daily also walks with a walker in her home.  On 08/13/15 she went to [facility] ED for weakness and disorientation for the last few days.  Husband in room.  Pain Inventory Average Pain 5 Pain Right Now 7 My pain is constant, dull and aching  In the last 24 hours, has pain interfered with the following? General activity NA Relation with others NA Enjoyment of life NA What TIME of day is your pain at its worst? night Sleep (in general) Fair  Pain is worse with: sitting and inactivity Pain improves with: medication Relief from Meds: 3  Mobility use a wheelchair needs help with transfers  Function retired  Neuro/Psych bowel control problems depression anxiety  Prior Studies Any changes since last visit?  no  Physicians involved in your care Any changes since last visit?  no   Family History  Problem Relation Age of Onset  . Heart disease Mother   . Cancer Father     throat  . Atrial fibrillation Sister    Social History   Social History  . Marital Status: Married    Spouse Name: N/A  . Number of Children: N/A  . Years of Education: N/A   Social History Main Topics  . Smoking status: Current Every Day Smoker -- 0.50 packs/day for 65 years    Types: Cigarettes  . Smokeless tobacco: Never Used  . Alcohol Use: Yes     Comment: Occasional  . Drug Use: No  . Sexual Activity: No   Other Topics Concern  . None   Social History Narrative   Past Surgical History  Procedure Laterality Date  . Right ankle surgery    . Knee arthroscopy    . Intramedullary  (im) nail intertrochanteric Right 06/26/2014    Procedure: INTRAMEDULLARY (IM) NAIL INTERTROCHANTRIC;  Surgeon: Mauri Pole, MD;  Location: [facility];  Service: Orthopedics;  Laterality: Right;   Past Medical History  Diagnosis Date  . Paroxysmal atrial fibrillation (HCC)     chronic anticoag until 01/2013 - stopped due to high fall risk  . Right bundle branch block and incomplete left bundle branch block     with left axis deviation  . Chronic back pain     narcotic dependence  . Lumbar disc disease     dr. Trenton Gammon  . Mitral valve prolapse   . Macular degeneration   . Gastritis     RUT negative, 2005  . Diverticulosis   . Alcohol abuse   . Compression fracture 02/2011    s/p KP  . Arthritis   . Depression   . Myocardial infarction ([place])   . COPD (chronic obstructive pulmonary disease) ([place])   . Intertrochanteric fracture of right hip ([place]) 06/26/2014  . ANXIETY DEPRESSION 10/01/2009    Qualifier: Diagnosis of  By: Niel Hummer MD, Lorinda Creed   . Dementia 01/28/2013  . Diabetes mellitus without complication ([place]) 123456  . Fall 01/21/2013  . HLD (hyperlipidemia) 05/30/2007    Qualifier: Diagnosis of  By: Niel Hummer MD, [place]  Hypertension 02/20/2013  . Insomnia 01/28/2013  . Protein-calorie malnutrition, severe ([place]) 06/26/2014  . Weight loss, unintentional 02/08/2013  . VITAMIN D DEFICIENCY 03/20/2008    Qualifier: Diagnosis of  By: Cori Razor RN, Mikal Plane Tobacco abuse 06/26/2014   BP 107/56 mmHg  Pulse 66  Resp 14  SpO2 93%  Opioid Risk Score:   Fall Risk Score:  `1  Depression screen PHQ 2/9  Depression screen [facility] 2/9 01/26/2015 12/30/2014 10/09/2014  Decreased Interest 0 0 0  Down, Depressed, Hopeless 0 0 3  PHQ - 2 Score 0 0 3  Altered sleeping - - 0  Tired, decreased energy - - 2  Change in appetite - - 0  Feeling bad or failure about yourself  - - 1  Trouble concentrating - - 0  Moving slowly or fidgety/restless - - 3  Suicidal thoughts - - 0  PHQ-9 Score - -  9     Review of Systems  All other systems reviewed and are negative.      Objective:   Physical Exam  Constitutional: She is oriented to person, place, and time. She appears well-developed and well-nourished.  HENT:  Head: Normocephalic and atraumatic.  Neck: Normal range of motion. Neck supple.  Cardiovascular: Normal rate and regular rhythm.   Pulmonary/Chest: Effort normal and breath sounds normal.  Musculoskeletal:  Normal Muscle Bulk and Muscle Testing Reveals: Upper Extremities: Full ROM and Muscle Strength 5/5 Thoracic Paraspinal Tenderness: T-1- T-7 Lumbar Hypersensitivity Lower Extremities: Full ROM and Muscle Strength 5/5 Arrived in wheelchair  Neurological: She is alert and oriented to person, place, and time.  Skin: Skin is warm and dry.  Psychiatric: She has a normal mood and affect.  Nursing note and vitals reviewed.         Assessment & Plan:  1. Severe cervical and lumbar degenerative disc with chronic spine pain.Refilled: Fentanyl 50 mcg one patch every three days #10 andHydrocodone 5/325 mg one tablet twice a day as needed for moderate pain. #60.  2. Lumbar spinal stenosis with nerve root compression left S1. Continue to Monitor. 3. Diabetic neuropathy severe: Continue to monitor 4. Kyphoscoliosis: Continue to Monitor / Poor Mobility 5. Bilateral Knee Pain: Ortho Following  20 minutes of face to face patient care time was spent during this visit. All questions were encouraged and answered.  F/U in 1 month

## 2015-08-17 NOTE — Telephone Encounter (Signed)
error 

## 2015-09-01 ENCOUNTER — Ambulatory Visit (INDEPENDENT_AMBULATORY_CARE_PROVIDER_SITE_OTHER): Payer: Medicare Other | Admitting: Pharmacist Clinician (PhC)/ Clinical Pharmacy Specialist

## 2015-09-01 DIAGNOSIS — I480 Paroxysmal atrial fibrillation: Secondary | ICD-10-CM | POA: Diagnosis not present

## 2015-09-01 DIAGNOSIS — Z5181 Encounter for therapeutic drug level monitoring: Secondary | ICD-10-CM | POA: Diagnosis not present

## 2015-09-01 LAB — POCT INR: INR: 3.7

## 2015-09-14 ENCOUNTER — Encounter: Payer: Self-pay | Admitting: Registered Nurse

## 2015-09-14 ENCOUNTER — Encounter: Payer: Medicare Other | Attending: Physical Medicine & Rehabilitation | Admitting: Registered Nurse

## 2015-09-14 VITALS — BP 109/44 | HR 60 | Resp 14

## 2015-09-14 DIAGNOSIS — M48061 Spinal stenosis, lumbar region without neurogenic claudication: Secondary | ICD-10-CM

## 2015-09-14 DIAGNOSIS — G894 Chronic pain syndrome: Secondary | ICD-10-CM | POA: Diagnosis not present

## 2015-09-14 DIAGNOSIS — Z5181 Encounter for therapeutic drug level monitoring: Secondary | ICD-10-CM | POA: Insufficient documentation

## 2015-09-14 DIAGNOSIS — M5030 Other cervical disc degeneration, unspecified cervical region: Secondary | ICD-10-CM | POA: Diagnosis not present

## 2015-09-14 DIAGNOSIS — M4806 Spinal stenosis, lumbar region: Secondary | ICD-10-CM | POA: Diagnosis not present

## 2015-09-14 DIAGNOSIS — Z79899 Other long term (current) drug therapy: Secondary | ICD-10-CM | POA: Insufficient documentation

## 2015-09-14 DIAGNOSIS — M2010 Hallux valgus (acquired), unspecified foot: Secondary | ICD-10-CM | POA: Diagnosis not present

## 2015-09-14 DIAGNOSIS — E1142 Type 2 diabetes mellitus with diabetic polyneuropathy: Secondary | ICD-10-CM

## 2015-09-14 MED ORDER — HYDROCODONE-ACETAMINOPHEN 5-325 MG PO TABS: 1.0000 | ORAL_TABLET | Freq: Two times a day (BID) | ORAL | Status: DC

## 2015-09-14 NOTE — Progress Notes (Signed)
Subjective:    Patient ID: [name], female    DOB: May 24, 1932, 82 y.o.   MRN: [id number]  HPI: [name] is a 82 year old female who returns for follow up for chronic pain and medication refill. She states her pain is located in her bilateral shoulder's entire back, bilateral knees and lower extremities. Also states she has generalizes pain all over. She rates her pain 7. Her current exercise regime is chair exercises 1-2 times daily also walks with a walker in her home.  Mr. Parys asked if we could increase his wife  Hydrocodone to 7.5 due to wife's pain. Husband states he is on hydrocodone as well. We discuss her recent hospitalization and safety precautions due to her age, she verbalizes understanding. At this time her Hydrocodone dose will remain the same.  We also discuss she should only be given the  medication we prescribed only. She verbalizes understanding. She didn't bring in her medications, NCCSR was reviewed Hydrocodone was picked up on 08/15/15 and Fentanyl 07/09/15. Husband in room, all questions answered.  Pain Inventory Average Pain 7 Pain Right Now 7 My pain is intermittent, constant, sharp, burning, dull, stabbing, tingling and aching  In the last 24 hours, has pain interfered with the following? General activity 5 Relation with others 5 Enjoyment of life 5 What TIME of day is your pain at its worst? all Sleep (in general) Poor  Pain is worse with: no selection Pain improves with: no selection Relief from Meds: no selection  Mobility use a wheelchair Do you have any goals in this area?  no  Function Do you have any goals in this area?  no  Neuro/Psych No problems in this area  Prior Studies Any changes since last visit?  no  Physicians involved in your care Any changes since last visit?  no   Family History  Problem Relation Age of Onset  . Heart disease Mother   . Cancer Father     throat  . Atrial fibrillation Sister    Social History    Social History  . Marital Status: Married    Spouse Name: N/A  . Number of Children: N/A  . Years of Education: N/A   Social History Main Topics  . Smoking status: Current Every Day Smoker -- 0.50 packs/day for 65 years    Types: Cigarettes  . Smokeless tobacco: Never Used  . Alcohol Use: Yes     Comment: Occasional  . Drug Use: No  . Sexual Activity: No   Other Topics Concern  . None   Social History Narrative   Past Surgical History  Procedure Laterality Date  . Right ankle surgery    . Knee arthroscopy    . Intramedullary (im) nail intertrochanteric Right 06/26/2014    Procedure: INTRAMEDULLARY (IM) NAIL INTERTROCHANTRIC;  Surgeon: Mauri Pole, MD;  Location: [facility];  Service: Orthopedics;  Laterality: Right;   Past Medical History  Diagnosis Date  . Paroxysmal atrial fibrillation (HCC)     chronic anticoag until 01/2013 - stopped due to high fall risk  . Right bundle branch block and incomplete left bundle branch block     with left axis deviation  . Chronic back pain     narcotic dependence  . Lumbar disc disease     dr. Trenton Gammon  . Mitral valve prolapse   . Macular degeneration   . Gastritis     RUT negative, 2005  . Diverticulosis   . Alcohol abuse   .  Compression fracture 02/2011    s/p KP  . Arthritis   . Depression   . Myocardial infarction ([place])   . COPD (chronic obstructive pulmonary disease) ([place])   . Intertrochanteric fracture of right hip ([place]) 06/26/2014  . ANXIETY DEPRESSION 10/01/2009    Qualifier: Diagnosis of  By: Niel Hummer MD, Lorinda Creed   . Dementia 01/28/2013  . Diabetes mellitus without complication ([place]) 123456  . Fall 01/21/2013  . HLD (hyperlipidemia) 05/30/2007    Qualifier: Diagnosis of  By: Niel Hummer MD, [place] Hypertension 02/20/2013  . Insomnia 01/28/2013  . Protein-calorie malnutrition, severe ([place]) 06/26/2014  . Weight loss, unintentional 02/08/2013  . VITAMIN D DEFICIENCY 03/20/2008    Qualifier: Diagnosis of  By:  Cori Razor RN, Mikal Plane Tobacco abuse 06/26/2014   BP 109/44 mmHg  Pulse 59  Resp 14  SpO2 91%  Opioid Risk Score:   Fall Risk Score:  `1  Depression screen PHQ 2/9  Depression screen [facility] 2/9 01/26/2015 12/30/2014 10/09/2014  Decreased Interest 0 0 0  Down, Depressed, Hopeless 0 0 3  PHQ - 2 Score 0 0 3  Altered sleeping - - 0  Tired, decreased energy - - 2  Change in appetite - - 0  Feeling bad or failure about yourself  - - 1  Trouble concentrating - - 0  Moving slowly or fidgety/restless - - 3  Suicidal thoughts - - 0  PHQ-9 Score - - 9     Review of Systems  All other systems reviewed and are negative.      Objective:   Physical Exam  Constitutional: She is oriented to person, place, and time. She appears well-developed and well-nourished.  HENT:  Head: Normocephalic and atraumatic.  Neck: Normal range of motion. Neck supple.  Cardiovascular: Normal rate and regular rhythm.   Pulmonary/Chest: Effort normal and breath sounds normal.  Musculoskeletal:  Normal Muscle Bulk and Muscle Testing Reveals: Upper Extremities: Full ROM and Muscle Strength 5/5 Thoracic and Lumbar Hypersensitivity Lower Extremities: Full ROM and Muscle Strength 5/5 Arrived in wheelchair  Neurological: She is alert and oriented to person, place, and time.  Skin: Skin is warm and dry.  Psychiatric: She has a normal mood and affect.  Nursing note and vitals reviewed.         Assessment & Plan:  1. Severe cervical and lumbar degenerative disc with chronic spine pain.Continue: Fentanyl 50 mcg one patch every three days #10, no script given today, has a script at pharmacy. Refilled:Hydrocodone 5/325 mg one tablet twice a day as needed for moderate pain. #60.  We will continue the opioid monitoring program, this consists of regular clinic visits, examinations, urine drug screen, pill counts as well as use of [place] Controlled Substance Reporting System. 2. Lumbar spinal stenosis with  nerve root compression left S1. Continue to Monitor. 3. Diabetic neuropathy severe: Continue to monitor 4. Kyphoscoliosis: Continue to Monitor / Poor Mobility 5. Bilateral Knee Pain: Ortho Following  20 minutes of face to face patient care time was spent during this visit. All questions were encouraged and answered.  F/U in 1 month

## 2015-09-19 LAB — TOXASSURE SELECT,+ANTIDEPR,UR

## 2015-09-22 ENCOUNTER — Encounter: Payer: Medicare Other | Admitting: Pharmacist Clinician (PhC)/ Clinical Pharmacy Specialist

## 2015-09-23 NOTE — Progress Notes (Signed)
Urine drug screen for this encounter is consistent for prescribed medication 

## 2015-09-25 ENCOUNTER — Ambulatory Visit (INDEPENDENT_AMBULATORY_CARE_PROVIDER_SITE_OTHER): Payer: Medicare Other | Admitting: Pharmacist

## 2015-09-25 DIAGNOSIS — Z5181 Encounter for therapeutic drug level monitoring: Secondary | ICD-10-CM | POA: Diagnosis not present

## 2015-09-25 DIAGNOSIS — I480 Paroxysmal atrial fibrillation: Secondary | ICD-10-CM | POA: Diagnosis not present

## 2015-09-25 LAB — POCT INR: INR: 3.8

## 2015-10-13 ENCOUNTER — Ambulatory Visit (INDEPENDENT_AMBULATORY_CARE_PROVIDER_SITE_OTHER): Payer: Medicare Other | Admitting: Pharmacist Clinician (PhC)/ Clinical Pharmacy Specialist

## 2015-10-13 DIAGNOSIS — Z5181 Encounter for therapeutic drug level monitoring: Secondary | ICD-10-CM

## 2015-10-13 DIAGNOSIS — I480 Paroxysmal atrial fibrillation: Secondary | ICD-10-CM

## 2015-10-13 LAB — POCT INR: INR: 1.8

## 2015-10-15 ENCOUNTER — Encounter: Payer: Medicare Other | Attending: Physical Medicine & Rehabilitation

## 2015-10-15 ENCOUNTER — Ambulatory Visit (HOSPITAL_BASED_OUTPATIENT_CLINIC_OR_DEPARTMENT_OTHER): Payer: Medicare Other | Admitting: Physical Medicine & Rehabilitation

## 2015-10-15 ENCOUNTER — Encounter: Payer: Self-pay | Admitting: Physical Medicine & Rehabilitation

## 2015-10-15 ENCOUNTER — Other Ambulatory Visit: Payer: Self-pay | Admitting: Internal Medicine

## 2015-10-15 VITALS — BP 110/69 | HR 60 | Resp 14

## 2015-10-15 DIAGNOSIS — Z5181 Encounter for therapeutic drug level monitoring: Secondary | ICD-10-CM | POA: Diagnosis not present

## 2015-10-15 DIAGNOSIS — M2010 Hallux valgus (acquired), unspecified foot: Secondary | ICD-10-CM | POA: Diagnosis not present

## 2015-10-15 DIAGNOSIS — E1142 Type 2 diabetes mellitus with diabetic polyneuropathy: Secondary | ICD-10-CM | POA: Insufficient documentation

## 2015-10-15 DIAGNOSIS — Z79899 Other long term (current) drug therapy: Secondary | ICD-10-CM | POA: Insufficient documentation

## 2015-10-15 DIAGNOSIS — M5030 Other cervical disc degeneration, unspecified cervical region: Secondary | ICD-10-CM | POA: Diagnosis not present

## 2015-10-15 DIAGNOSIS — G894 Chronic pain syndrome: Secondary | ICD-10-CM | POA: Diagnosis not present

## 2015-10-15 DIAGNOSIS — M4806 Spinal stenosis, lumbar region: Secondary | ICD-10-CM

## 2015-10-15 DIAGNOSIS — M48061 Spinal stenosis, lumbar region without neurogenic claudication: Secondary | ICD-10-CM

## 2015-10-15 MED ORDER — FENTANYL 50 MCG/HR TD PT72: 50.0000 ug | MEDICATED_PATCH | TRANSDERMAL | Status: DC

## 2015-10-15 MED ORDER — HYDROCODONE-ACETAMINOPHEN 5-325 MG PO TABS: 1.0000 | ORAL_TABLET | Freq: Two times a day (BID) | ORAL | Status: DC

## 2015-10-15 NOTE — Progress Notes (Signed)
Subjective:    Patient ID: [name], female    DOB: 11-19-1932, 82 y.o.   MRN: [id number]  HPI  82 year old female with chronic low back pain. She is mainly in a wheelchair. She continues to smoke about a half pack per day. She denies any recent respiratory infections. Reviewed nurse practitioner notes. Patient has been asking for higher dose of hydrocodone. Specifically she wishes to increase her hydrocodone dose from 5 mg twice a day to 7.5 mg twice a day. Reviewed records. At one point in November 2016 went up to hydrocodone 5 mg 3 times a day but she was having sedation as well as mental status changes. These side effects resolved after reducing the hydrocodone to 5 mg twice a day. We discussed that going up to 7.5 mg twice a day would give her the same total amount of hydrocodone per day and may need to similar side effects.  Patient remains on fentanyl patch 50  g every 72 hours Pain Inventory Average Pain 7 Pain Right Now 7 My pain is constant, sharp, burning, stabbing and aching  In the last 24 hours, has pain interfered with the following? General activity 5 Relation with others 5 Enjoyment of life 5 What TIME of day is your pain at its worst? morning, daytime, evening, night Sleep (in general) NA  Pain is worse with: NA Pain improves with: NA Relief from Meds: 6  Mobility use a walker ability to climb steps?  no do you drive?  no use a wheelchair  Function not employed: date last employed NA I need assistance with the following:  meal prep, household duties and shopping Do you have any goals in this area?  no  Neuro/Psych No problems in this area  Prior Studies Any changes since last visit?  no  Physicians involved in your care Any changes since last visit?  no   Family History  Problem Relation Age of Onset  . Heart disease Mother   . Cancer Father     throat  . Atrial fibrillation Sister    Social History   Social History  . Marital Status:  Married    Spouse Name: N/A  . Number of Children: N/A  . Years of Education: N/A   Social History Main Topics  . Smoking status: Current Every Day Smoker -- 0.50 packs/day for 65 years    Types: Cigarettes  . Smokeless tobacco: Never Used  . Alcohol Use: Yes     Comment: Occasional  . Drug Use: No  . Sexual Activity: No   Other Topics Concern  . None   Social History Narrative   Past Surgical History  Procedure Laterality Date  . Right ankle surgery    . Knee arthroscopy    . Intramedullary (im) nail intertrochanteric Right 06/26/2014    Procedure: INTRAMEDULLARY (IM) NAIL INTERTROCHANTRIC;  Surgeon: Mauri Pole, MD;  Location: [facility];  Service: Orthopedics;  Laterality: Right;   Past Medical History  Diagnosis Date  . Paroxysmal atrial fibrillation (HCC)     chronic anticoag until 01/2013 - stopped due to high fall risk  . Right bundle branch block and incomplete left bundle branch block     with left axis deviation  . Chronic back pain     narcotic dependence  . Lumbar disc disease     dr. Trenton Gammon  . Mitral valve prolapse   . Macular degeneration   . Gastritis     RUT negative, 2005  . Diverticulosis   .  Alcohol abuse   . Compression fracture 02/2011    s/p KP  . Arthritis   . Depression   . Myocardial infarction ([place])   . COPD (chronic obstructive pulmonary disease) ([place])   . Intertrochanteric fracture of right hip ([place]) 06/26/2014  . ANXIETY DEPRESSION 10/01/2009    Qualifier: Diagnosis of  By: Niel Hummer MD, Lorinda Creed   . Dementia 01/28/2013  . Diabetes mellitus without complication ([place]) 123456  . Fall 01/21/2013  . HLD (hyperlipidemia) 05/30/2007    Qualifier: Diagnosis of  By: Niel Hummer MD, [place] Hypertension 02/20/2013  . Insomnia 01/28/2013  . Protein-calorie malnutrition, severe ([place]) 06/26/2014  . Weight loss, unintentional 02/08/2013  . VITAMIN D DEFICIENCY 03/20/2008    Qualifier: Diagnosis of  By: Cori Razor RN, Mikal Plane Tobacco abuse  06/26/2014   BP 110/69 mmHg  Pulse 60  Resp 14  SpO2 95%  Opioid Risk Score:   Fall Risk Score:  `1  Depression screen PHQ 2/9  Depression screen [facility] 2/9 01/26/2015 12/30/2014 10/09/2014  Decreased Interest 0 0 0  Down, Depressed, Hopeless 0 0 3  PHQ - 2 Score 0 0 3  Altered sleeping - - 0  Tired, decreased energy - - 2  Change in appetite - - 0  Feeling bad or failure about yourself  - - 1  Trouble concentrating - - 0  Moving slowly or fidgety/restless - - 3  Suicidal thoughts - - 0  PHQ-9 Score - - 9      Review of Systems  All other systems reviewed and are negative.      Objective:   Physical Exam  Constitutional: She appears well-developed.  thin  HENT:  Head: Normocephalic and atraumatic.  Eyes: Conjunctivae and EOM are normal. Pupils are equal, round, and reactive to light.  Musculoskeletal:       Thoracic back: She exhibits tenderness. She exhibits no bony tenderness.       Lumbar back: She exhibits decreased range of motion and tenderness. She exhibits no bony tenderness.  Neurological: She is alert. She exhibits normal muscle tone. Coordination normal.  Psychiatric: Her speech is normal. Thought content normal. Her affect is blunt. She is slowed.  Nursing note and vitals reviewed.   Tenderness to palpation in the thoracic and lumbar paraspinal muscles. No tenderness palpation over the spinous processes. Patient has kyphotic posture. She has 4 minus strength in both lower extremities at the hip flexor and extensor ankle dorsiflexor. Mood and affect without evidence of lability or agitation she is alert she is able to answer questions well.      Assessment & Plan:  1. Lumbar spinal stenosis with chronic axial low back pain. No significant radicular component. Continue fentanyl patch 50 g every 72 hours Continue hydrocodone 5 g twice a day  2. History of dementia, mild on Namenda, need to be judicious with pain medication. I would not increase it from  current levels  3. Myofascial pain I think this is positional being in the wheelchair much of the time. Do not expect much compliance in terms of exercise. Will trial  Aspercreme versus icy hot with lidocaine. Recommend 3 times per day. Patient will see how this does for her. Husband will need to apply this.  4. History of diabetes with peripheral neuropathy and no diabetic nerve pain at the current time.

## 2015-10-15 NOTE — Patient Instructions (Signed)
Try Aspercreme apply to back 3 times a day  Or icy hot with lidocaine applied to back 3 times a day as needed

## 2015-11-13 ENCOUNTER — Encounter: Payer: Self-pay | Admitting: Registered Nurse

## 2015-11-13 ENCOUNTER — Encounter (HOSPITAL_BASED_OUTPATIENT_CLINIC_OR_DEPARTMENT_OTHER): Payer: Medicare Other | Admitting: Registered Nurse

## 2015-11-13 VITALS — BP 117/70 | HR 64 | Resp 14

## 2015-11-13 DIAGNOSIS — M48061 Spinal stenosis, lumbar region without neurogenic claudication: Secondary | ICD-10-CM

## 2015-11-13 DIAGNOSIS — Z5181 Encounter for therapeutic drug level monitoring: Secondary | ICD-10-CM

## 2015-11-13 DIAGNOSIS — M4806 Spinal stenosis, lumbar region: Secondary | ICD-10-CM

## 2015-11-13 DIAGNOSIS — Z79899 Other long term (current) drug therapy: Secondary | ICD-10-CM

## 2015-11-13 DIAGNOSIS — G894 Chronic pain syndrome: Secondary | ICD-10-CM

## 2015-11-13 DIAGNOSIS — E1142 Type 2 diabetes mellitus with diabetic polyneuropathy: Secondary | ICD-10-CM | POA: Diagnosis not present

## 2015-11-13 MED ORDER — FENTANYL 50 MCG/HR TD PT72: 50.0000 ug | MEDICATED_PATCH | TRANSDERMAL | Status: DC

## 2015-11-13 MED ORDER — HYDROCODONE-ACETAMINOPHEN 5-325 MG PO TABS: 1.0000 | ORAL_TABLET | Freq: Two times a day (BID) | ORAL | Status: DC

## 2015-11-13 NOTE — Progress Notes (Signed)
Subjective:    Patient ID: [name], female    DOB: [birth year], 82 y.o.   MRN: [id number]  HPI: [name] is a 82 year old female who returns for follow up for chronic pain and medication refill. She states her pain is located in her neck, upper and lower back. Also states she has generalizes pain all over. She rates her pain 7. Her current exercise regime is chair exercises twice a day and walks with a walker in her home.  Husband in room, all questions answered.  Pain Inventory Average Pain 7 Pain Right Now 7 My pain is constant, sharp, burning, stabbing and aching  In the last 24 hours, has pain interfered with the following? General activity 5 Relation with others 5 Enjoyment of life 5 What TIME of day is your pain at its worst? all Sleep (in general) Poor  Pain is worse with: walking, bending, sitting, inactivity, standing and some activites Pain improves with: medication Relief from Meds: 6  Mobility walk with assistance use a walker how many minutes can you walk? very little ability to climb steps?  no do you drive?  no use a wheelchair needs help with transfers  Function retired  Neuro/Psych weakness trouble walking dizziness confusion  Prior Studies Any changes since last visit?  no  Physicians involved in your care Any changes since last visit?  no   Family History  Problem Relation Age of Onset  . Heart disease Mother   . Cancer Father     throat  . Atrial fibrillation Sister    Social History   Social History  . Marital Status: Married    Spouse Name: N/A  . Number of Children: N/A  . Years of Education: N/A   Social History Main Topics  . Smoking status: Current Every Day Smoker -- 0.50 packs/day for 65 years    Types: Cigarettes  . Smokeless tobacco: Never Used  . Alcohol Use: Yes     Comment: Occasional  . Drug Use: No  . Sexual Activity: No   Other Topics Concern  . Not on file   Social History Narrative   Past  Surgical History  Procedure Laterality Date  . Right ankle surgery    . Knee arthroscopy    . Intramedullary (im) nail intertrochanteric Right 06/26/2014    Procedure: INTRAMEDULLARY (IM) NAIL INTERTROCHANTRIC;  Surgeon: Mauri Pole, MD;  Location: [facility];  Service: Orthopedics;  Laterality: Right;   Past Medical History  Diagnosis Date  . Paroxysmal atrial fibrillation (HCC)     chronic anticoag until 01/2013 - stopped due to high fall risk  . Right bundle branch block and incomplete left bundle branch block     with left axis deviation  . Chronic back pain     narcotic dependence  . Lumbar disc disease     dr. Trenton Gammon  . Mitral valve prolapse   . Macular degeneration   . Gastritis     RUT negative, 2005  . Diverticulosis   . Alcohol abuse   . Compression fracture 02/2011    s/p KP  . Arthritis   . Depression   . Myocardial infarction ([place])   . COPD (chronic obstructive pulmonary disease) ([place])   . Intertrochanteric fracture of right hip ([place]) 06/26/2014  . ANXIETY DEPRESSION 10/01/2009    Qualifier: Diagnosis of  By: Niel Hummer MD, Lorinda Creed   . Dementia 01/28/2013  . Diabetes mellitus without complication ([place]) 123456  . Fall  01/21/2013  . HLD (hyperlipidemia) 05/30/2007    Qualifier: Diagnosis of  By: Niel Hummer MD, [place] Hypertension 02/20/2013  . Insomnia 01/28/2013  . Protein-calorie malnutrition, severe ([place]) 06/26/2014  . Weight loss, unintentional 02/08/2013  . VITAMIN D DEFICIENCY 03/20/2008    Qualifier: Diagnosis of  By: Cori Razor RN, Mikal Plane Tobacco abuse 06/26/2014   There were no vitals taken for this visit.  Opioid Risk Score:   Fall Risk Score:  `1  Depression screen PHQ 2/9  Depression screen [facility] 2/9 01/26/2015 12/30/2014 10/09/2014  Decreased Interest 0 0 0  Down, Depressed, Hopeless 0 0 3  PHQ - 2 Score 0 0 3  Altered sleeping - - 0  Tired, decreased energy - - 2  Change in appetite - - 0  Feeling bad or failure about yourself  - - 1    Trouble concentrating - - 0  Moving slowly or fidgety/restless - - 3  Suicidal thoughts - - 0  PHQ-9 Score - - 9     Review of Systems  Constitutional: Negative.  Negative for fever.  HENT: Negative.   Eyes: Negative.   Respiratory: Negative.   Cardiovascular: Negative.   Gastrointestinal: Negative.   Endocrine: Negative.   Genitourinary: Negative.   Musculoskeletal: Positive for myalgias, back pain, arthralgias, gait problem, neck pain and neck stiffness.  Skin: Negative.   Allergic/Immunologic: Negative.   Neurological: Positive for dizziness.  Hematological: Negative.   Psychiatric/Behavioral: Negative.   All other systems reviewed and are negative.      Objective:   Physical Exam  Constitutional: She is oriented to person, place, and time. She appears well-developed and well-nourished.  HENT:  Head: Normocephalic and atraumatic.  Neck: Normal range of motion. Neck supple.  Cardiovascular: Normal rate and regular rhythm.   Pulmonary/Chest: Effort normal and breath sounds normal.  Musculoskeletal:  Normal Muscle Bulk and Muscle Testing Reveals: Upper Extremities Full ROM and Muscle Strength 5/5 Thoracic Paraspinal Tenderness: T-7- T-9 Lumbar Paraspinal Tenderness: L-4- L-5 Lower Extremities: Full ROM and Muscle Strength 5/5 Arises from table slowly using walker for support Narrow Based gait  Neurological: She is alert and oriented to person, place, and time.  Skin: Skin is warm and dry.  Psychiatric: She has a normal mood and affect.  Nursing note and vitals reviewed.         Assessment & Plan:  1. Severe cervical and lumbar degenerative disc with chronic spine pain.Refilled: Fentanyl 50 mcg one patch every three days #10 andHydrocodone 5/325 mg one tablet twice a day as needed for moderate pain. #60.  We will continue the opioid monitoring program, this consists of regular clinic visits, examinations, urine drug screen, pill counts as well as use of [place] Controlled Substance Reporting System. 2. Lumbar spinal stenosis with nerve root compression left S1. Continue to Monitor. 3. Diabetic neuropathy severe: Continue to monitor 4. Kyphoscoliosis: Continue to Monitor / Poor Mobility 5. Bilateral Knee Pain: Ortho Following  20 minutes of face to face patient care time was spent during this visit. All questions were encouraged and answered.  F/U in 1 month

## 2015-11-24 ENCOUNTER — Ambulatory Visit (INDEPENDENT_AMBULATORY_CARE_PROVIDER_SITE_OTHER): Payer: Medicare Other | Admitting: Pharmacist

## 2015-11-24 DIAGNOSIS — Z5181 Encounter for therapeutic drug level monitoring: Secondary | ICD-10-CM

## 2015-11-24 DIAGNOSIS — I480 Paroxysmal atrial fibrillation: Secondary | ICD-10-CM | POA: Diagnosis not present

## 2015-11-24 LAB — POCT INR: INR: 3.4

## 2015-12-08 ENCOUNTER — Encounter: Payer: Medicare Other | Attending: Physical Medicine & Rehabilitation | Admitting: Registered Nurse

## 2015-12-08 DIAGNOSIS — Z79899 Other long term (current) drug therapy: Secondary | ICD-10-CM | POA: Insufficient documentation

## 2015-12-08 DIAGNOSIS — M4806 Spinal stenosis, lumbar region: Secondary | ICD-10-CM | POA: Insufficient documentation

## 2015-12-08 DIAGNOSIS — E1142 Type 2 diabetes mellitus with diabetic polyneuropathy: Secondary | ICD-10-CM | POA: Insufficient documentation

## 2015-12-08 DIAGNOSIS — M2010 Hallux valgus (acquired), unspecified foot: Secondary | ICD-10-CM | POA: Insufficient documentation

## 2015-12-08 DIAGNOSIS — Z5181 Encounter for therapeutic drug level monitoring: Secondary | ICD-10-CM | POA: Insufficient documentation

## 2015-12-08 DIAGNOSIS — M5030 Other cervical disc degeneration, unspecified cervical region: Secondary | ICD-10-CM | POA: Insufficient documentation

## 2015-12-08 DIAGNOSIS — G894 Chronic pain syndrome: Secondary | ICD-10-CM | POA: Insufficient documentation

## 2015-12-11 ENCOUNTER — Ambulatory Visit: Payer: Medicare Other | Admitting: Registered Nurse

## 2015-12-11 ENCOUNTER — Encounter: Payer: Self-pay | Admitting: Physical Medicine & Rehabilitation

## 2015-12-11 ENCOUNTER — Ambulatory Visit (HOSPITAL_BASED_OUTPATIENT_CLINIC_OR_DEPARTMENT_OTHER): Payer: Medicare Other | Admitting: Physical Medicine & Rehabilitation

## 2015-12-11 VITALS — BP 134/80 | HR 73 | Resp 17

## 2015-12-11 DIAGNOSIS — M2010 Hallux valgus (acquired), unspecified foot: Secondary | ICD-10-CM | POA: Diagnosis not present

## 2015-12-11 DIAGNOSIS — G894 Chronic pain syndrome: Secondary | ICD-10-CM

## 2015-12-11 DIAGNOSIS — M4806 Spinal stenosis, lumbar region: Secondary | ICD-10-CM

## 2015-12-11 DIAGNOSIS — E1142 Type 2 diabetes mellitus with diabetic polyneuropathy: Secondary | ICD-10-CM | POA: Diagnosis not present

## 2015-12-11 DIAGNOSIS — Z79899 Other long term (current) drug therapy: Secondary | ICD-10-CM | POA: Diagnosis not present

## 2015-12-11 DIAGNOSIS — G243 Spasmodic torticollis: Secondary | ICD-10-CM | POA: Diagnosis not present

## 2015-12-11 DIAGNOSIS — M5030 Other cervical disc degeneration, unspecified cervical region: Secondary | ICD-10-CM

## 2015-12-11 DIAGNOSIS — Z5181 Encounter for therapeutic drug level monitoring: Secondary | ICD-10-CM | POA: Diagnosis not present

## 2015-12-11 DIAGNOSIS — M48061 Spinal stenosis, lumbar region without neurogenic claudication: Secondary | ICD-10-CM

## 2015-12-11 MED ORDER — HYDROCODONE-ACETAMINOPHEN 5-325 MG PO TABS: 1.0000 | ORAL_TABLET | Freq: Two times a day (BID) | ORAL | 0 refills | Status: DC

## 2015-12-11 MED ORDER — FENTANYL 50 MCG/HR TD PT72: 50.0000 ug | MEDICATED_PATCH | TRANSDERMAL | 0 refills | Status: AC

## 2015-12-11 NOTE — Patient Instructions (Signed)
See pamphlet Discussed with Zella Ball. If you decide to proceed with this and we'll need to get insurance approval

## 2015-12-11 NOTE — Progress Notes (Signed)
Subjective:     Patient ID: [name], female   DOB: 12-19-1932, 82 y.o.   MRN: [id number]  HPI complains of increasing right-sided neck pain. She has had no falls. She walks with a walker most the time, although for longer distances.in a wheelchair.    Pain Inventory Average Pain 7 Pain Right Now 7 My pain is NA  In the last 24 hours, has pain interfered with the following? General activity 8 Relation with others 8 Enjoyment of life 9 What TIME of day is your pain at its worst? constant Sleep (in general) NA  Pain is worse with: NA Pain improves with: NA Relief from Meds: NA  Mobility use a walker ability to climb steps?  no do you drive?  no  Function retired I need assistance with the following:  meal prep, household duties and shopping  Neuro/Psych No problems in this area  Prior Studies Any changes since last visit?  no  Physicians involved in your care Any changes since last visit?  no   Family History  Problem Relation Age of Onset  . Heart disease Mother   . Cancer Father     throat  . Atrial fibrillation Sister    Social History   Social History  . Marital status: Married    Spouse name: N/A  . Number of children: N/A  . Years of education: N/A   Social History Main Topics  . Smoking status: Current Every Day Smoker    Packs/day: 0.50    Years: 65.00    Types: Cigarettes  . Smokeless tobacco: Never Used  . Alcohol use Yes     Comment: Occasional  . Drug use: No  . Sexual activity: No   Other Topics Concern  . None   Social History Narrative  . None   Past Surgical History:  Procedure Laterality Date  . INTRAMEDULLARY (IM) NAIL INTERTROCHANTERIC Right 06/26/2014   Procedure: INTRAMEDULLARY (IM) NAIL INTERTROCHANTRIC;  Surgeon: Mauri Pole, MD;  Location: [facility];  Service: Orthopedics;  Laterality: Right;  . KNEE ARTHROSCOPY    . right ankle surgery     Past Medical History:  Diagnosis Date  . Alcohol abuse   . ANXIETY  DEPRESSION 10/01/2009   Qualifier: Diagnosis of  By: Niel Hummer MD, Lorinda Creed   . Arthritis   . Chronic back pain    narcotic dependence  . Compression fracture 02/2011   s/p KP  . COPD (chronic obstructive pulmonary disease) ([place])   . Dementia 01/28/2013  . Depression   . Diabetes mellitus without complication ([place]) 123456  . Diverticulosis   . Fall 01/21/2013  . Gastritis    RUT negative, 2005  . HLD (hyperlipidemia) 05/30/2007   Qualifier: Diagnosis of  By: Niel Hummer MD, [place] Hypertension 02/20/2013  . Insomnia 01/28/2013  . Intertrochanteric fracture of right hip ([place]) 06/26/2014  . Lumbar disc disease    dr. Trenton Gammon  . Macular degeneration   . Mitral valve prolapse   . Myocardial infarction ([place])   . Paroxysmal atrial fibrillation (HCC)    chronic anticoag until 01/2013 - stopped due to high fall risk  . Protein-calorie malnutrition, severe ([place]) 06/26/2014  . Right bundle branch block and incomplete left bundle branch block    with left axis deviation  . Tobacco abuse 06/26/2014  . VITAMIN D DEFICIENCY 03/20/2008   Qualifier: Diagnosis of  By: Cori Razor RN, Mikal Plane   . Weight loss, unintentional 02/08/2013  BP 134/80   Pulse 73   Resp 17   SpO2 92%   Opioid Risk Score:   Fall Risk Score:  `1  Depression screen PHQ 2/9  Depression screen [facility] 2/9 01/26/2015 12/30/2014 10/09/2014  Decreased Interest 0 0 0  Down, Depressed, Hopeless 0 0 3  PHQ - 2 Score 0 0 3  Altered sleeping - - 0  Tired, decreased energy - - 2  Change in appetite - - 0  Feeling bad or failure about yourself  - - 1  Trouble concentrating - - 0  Moving slowly or fidgety/restless - - 3  Suicidal thoughts - - 0  PHQ-9 Score - - 9  Some recent data might be hidden   Review of Systems  All other systems reviewed and are negative.      Objective:   Physical Exam  Constitutional: She is oriented to person, place, and time. She appears well-developed and well-nourished.  HENT:  Head:  Normocephalic and atraumatic.  Eyes: Conjunctivae and EOM are normal. Pupils are equal, round, and reactive to light.  Neck: Normal range of motion.  Neurological: She is alert and oriented to person, place, and time. She exhibits abnormal muscle tone. Gait abnormal.  Reflex Scores:      Patellar reflexes are 1+ on the right side and 1+ on the left side.      Achilles reflexes are 1+ on the right side and 1+ on the left side. Ambulates with bilateral knee valgus deformities rolling walker. Kyphotic posture  Psychiatric: She has a normal mood and affect.  Nursing note and vitals reviewed.  Kyphoscoliosis dextro convex. Also, with prominent right levator scapula and right trapezius. Lower extremity strength 4/5. Bilateral hip flexor, knee extensor, ankle dorsal flexor. Neck positioning is with lateral Collis to the right Tenderness to palpation over the right trapezius as well as right levator scapula muscles.    Assessment:     1. Cervical and lumbar DDD With kyphoscoliosis Continue fentanyl patch 50 g every 72 hours. Continue hydrocodone 5/325 one by mouth twice a day when necessary  2. Cervical dystonia. This may be triggered by her scoliosis. She may benefit from low-dose Botox injection for cervical dystonia noted in the right trapezius, right levator scapula muscles. 50 units into each muscle.    Plan:     Continue prunes for constipation May consider Botox for cervical dystonia, 100 units divided between the splenius capitis and the trapezius on the right side. Will give some information on this  Continue current narcotic analgesic medications. Fentanyl 50 g 1 patch every 3 days, #10. Hydrocodone 5/325 one tablet twice a day, #60. Nurse practitioner. Visit 1 month

## 2015-12-22 ENCOUNTER — Ambulatory Visit (INDEPENDENT_AMBULATORY_CARE_PROVIDER_SITE_OTHER): Payer: Medicare Other | Admitting: Pharmacist Clinician (PhC)/ Clinical Pharmacy Specialist

## 2015-12-22 DIAGNOSIS — Z5181 Encounter for therapeutic drug level monitoring: Secondary | ICD-10-CM

## 2015-12-22 DIAGNOSIS — I480 Paroxysmal atrial fibrillation: Secondary | ICD-10-CM | POA: Diagnosis not present

## 2015-12-22 LAB — POCT INR: INR: 6.4

## 2016-01-05 ENCOUNTER — Ambulatory Visit (INDEPENDENT_AMBULATORY_CARE_PROVIDER_SITE_OTHER): Payer: Medicare Other | Admitting: Pharmacist Clinician (PhC)/ Clinical Pharmacy Specialist

## 2016-01-05 DIAGNOSIS — Z5181 Encounter for therapeutic drug level monitoring: Secondary | ICD-10-CM | POA: Diagnosis not present

## 2016-01-05 DIAGNOSIS — I480 Paroxysmal atrial fibrillation: Secondary | ICD-10-CM

## 2016-01-05 LAB — POCT INR: INR: 2

## 2016-01-08 ENCOUNTER — Ambulatory Visit (HOSPITAL_BASED_OUTPATIENT_CLINIC_OR_DEPARTMENT_OTHER): Payer: Medicare Other | Admitting: Physical Medicine & Rehabilitation

## 2016-01-08 ENCOUNTER — Encounter: Payer: Medicare Other | Attending: Physical Medicine & Rehabilitation

## 2016-01-08 ENCOUNTER — Encounter: Payer: Self-pay | Admitting: Physical Medicine & Rehabilitation

## 2016-01-08 VITALS — BP 105/66 | HR 63 | Resp 14

## 2016-01-08 DIAGNOSIS — M5030 Other cervical disc degeneration, unspecified cervical region: Secondary | ICD-10-CM | POA: Insufficient documentation

## 2016-01-08 DIAGNOSIS — M48061 Spinal stenosis, lumbar region without neurogenic claudication: Secondary | ICD-10-CM

## 2016-01-08 DIAGNOSIS — G894 Chronic pain syndrome: Secondary | ICD-10-CM

## 2016-01-08 DIAGNOSIS — M4806 Spinal stenosis, lumbar region: Secondary | ICD-10-CM | POA: Diagnosis not present

## 2016-01-08 DIAGNOSIS — M2010 Hallux valgus (acquired), unspecified foot: Secondary | ICD-10-CM | POA: Insufficient documentation

## 2016-01-08 DIAGNOSIS — Z79899 Other long term (current) drug therapy: Secondary | ICD-10-CM | POA: Diagnosis not present

## 2016-01-08 DIAGNOSIS — E1142 Type 2 diabetes mellitus with diabetic polyneuropathy: Secondary | ICD-10-CM | POA: Insufficient documentation

## 2016-01-08 DIAGNOSIS — Z5181 Encounter for therapeutic drug level monitoring: Secondary | ICD-10-CM | POA: Insufficient documentation

## 2016-01-08 DIAGNOSIS — G243 Spasmodic torticollis: Secondary | ICD-10-CM | POA: Diagnosis not present

## 2016-01-08 MED ORDER — HYDROCODONE-ACETAMINOPHEN 7.5-325 MG PO TABS: 1.0000 | ORAL_TABLET | Freq: Two times a day (BID) | ORAL | 0 refills | Status: DC

## 2016-01-08 NOTE — Progress Notes (Signed)
Subjective:     Patient ID: [name], female   DOB: [birth year], 82 y.o.   MRN: [id number]  HPI  81 year old female with history of lumbar spinal stenosis as well as diabetic polyneuropathy. In addition, she has severe osteoarthritis as well as lumbar kyphoscoliosis. Patient also has a history of osteoporosis. She has been losing weight recently, according to her husband. She remains on warfarin and has frequent checks after her INR shot up to 6. Her food intake has been poor. She see several doctors and is getting tired of seeing outcomes of doctors. She is required to come here for monthly visits for her narcotic analgesic monitoring. complains of increasing right-sided neck pain. She has had no falls. She walks with a walker most the time, although for longer distances.in a wheelchair.  Patient states that  Pain Inventory Average Pain 7 Pain Right Now 7 My pain is NA  In the last 24 hours, has pain interfered with the following? General activity 8 Relation with others 8 Enjoyment of life 9 What TIME of day is your pain at its worst? constant Sleep (in general) NA  Pain is worse with: NA Pain improves with: NA Relief from Meds: NA  Mobility use a walker ability to climb steps?  no do you drive?  no  Function retired I need assistance with the following:  meal prep, household duties and shopping  Neuro/Psych No problems in this area  Prior Studies Any changes since last visit?  no  Physicians involved in your care Any changes since last visit?  no   Family History  Problem Relation Age of Onset  . Heart disease Mother   . Cancer Father     throat  . Atrial fibrillation Sister    Social History   Social History  . Marital status: Married    Spouse name: N/A  . Number of children: N/A  . Years of education: N/A   Social History Main Topics  . Smoking status: Current Every Day Smoker    Packs/day: 0.50    Years: 65.00    Types: Cigarettes  .  Smokeless tobacco: Never Used  . Alcohol use Yes     Comment: Occasional  . Drug use: No  . Sexual activity: No   Other Topics Concern  . None   Social History Narrative  . None   Past Surgical History:  Procedure Laterality Date  . INTRAMEDULLARY (IM) NAIL INTERTROCHANTERIC Right 06/26/2014   Procedure: INTRAMEDULLARY (IM) NAIL INTERTROCHANTRIC;  Surgeon: Mauri Pole, MD;  Location: [facility];  Service: Orthopedics;  Laterality: Right;  . KNEE ARTHROSCOPY    . right ankle surgery     Past Medical History:  Diagnosis Date  . Alcohol abuse   . ANXIETY DEPRESSION 10/01/2009   Qualifier: Diagnosis of  By: Niel Hummer MD, Lorinda Creed   . Arthritis   . Chronic back pain    narcotic dependence  . Compression fracture 02/2011   s/p KP  . COPD (chronic obstructive pulmonary disease) ([place])   . Dementia 01/28/2013  . Depression   . Diabetes mellitus without complication ([place]) 123456  . Diverticulosis   . Fall 01/21/2013  . Gastritis    RUT negative, 2005  . HLD (hyperlipidemia) 05/30/2007   Qualifier: Diagnosis of  By: Niel Hummer MD, [place] Hypertension 02/20/2013  . Insomnia 01/28/2013  . Intertrochanteric fracture of right hip ([place]) 06/26/2014  . Lumbar disc disease    dr. Trenton Gammon  .  Macular degeneration   . Mitral valve prolapse   . Myocardial infarction ([place])   . Paroxysmal atrial fibrillation (HCC)    chronic anticoag until 01/2013 - stopped due to high fall risk  . Protein-calorie malnutrition, severe ([place]) 06/26/2014  . Right bundle branch block and incomplete left bundle branch block    with left axis deviation  . Tobacco abuse 06/26/2014  . VITAMIN D DEFICIENCY 03/20/2008   Qualifier: Diagnosis of  By: Cori Razor RN, Mikal Plane   . Weight loss, unintentional 02/08/2013   BP 105/66 (BP Location: Right Arm, Patient Position: Sitting, Cuff Size: Normal)   Pulse 63   Resp 14   SpO2 92%   Opioid Risk Score:   Fall Risk Score:  `1  Depression screen PHQ 2/9  Depression  screen [facility] 2/9 01/26/2015 12/30/2014 10/09/2014  Decreased Interest 0 0 0  Down, Depressed, Hopeless 0 0 3  PHQ - 2 Score 0 0 3  Altered sleeping - - 0  Tired, decreased energy - - 2  Change in appetite - - 0  Feeling bad or failure about yourself  - - 1  Trouble concentrating - - 0  Moving slowly or fidgety/restless - - 3  Suicidal thoughts - - 0  PHQ-9 Score - - 9  Some recent data might be hidden   Review of Systems  All other systems reviewed and are negative.      Objective:   Physical Exam  Constitutional: She is oriented to person, place, and time. She appears well-developed and well-nourished.  HENT:  Head: Normocephalic and atraumatic.  Eyes: Conjunctivae and EOM are normal. Pupils are equal, round, and reactive to light.  Neck: Normal range of motion.  Neurological: She is alert and oriented to person, place, and time. She exhibits abnormal muscle tone. Gait abnormal.  Reflex Scores:      Patellar reflexes are 1+ on the right side and 1+ on the left side.      Achilles reflexes are 1+ on the right side and 1+ on the left side. Ambulates with bilateral knee valgus deformities rolling walker. Kyphotic posture  Psychiatric: She has a normal mood and affect.  Nursing note and vitals reviewed.  Kyphoscoliosis dextro convex. Also, with prominent right levator scapula and right trapezius. Lower extremity strength 4/5. Bilateral hip flexor, knee extensor, ankle dorsal flexor. Neck positioning is with lateral Collis to the right Tenderness to palpation over the right trapezius as well as right levator scapula muscles.    Assessment:     1. Cervical and lumbar DDD With kyphoscoliosis Continue fentanyl patch 50 g every 72 hours. Continue hydrocodone 5/325 one by mouth twice a day when necessary  2. Cervical dystonia. This may be triggered by her scoliosis. She may benefit from low-dose Botox injection for cervical dystonia noted in the right trapezius, right levator  scapula muscles. 50 units into each muscle. SCM Left 25 Units- patient does not wish to explore this option at this time.  3. Chronic pain syndrome, multifactorial. Patient also has had some decline in terms of her intake. She is tired of seeing all her doctors and is thinking about stopping some of her doctors visits. We did discuss that we do require monthly visits at this clinic, but if she is looking more of a palliative care type option. I would explore this for her. We discussed this some years ago. At that point, she was not ready.    Plan:       See  above. We'll contact Dr. Hilma Favors to see what outpatient palliative care options are  Over half of the 25 min visit was spent counseling and coordinating care.

## 2016-01-08 NOTE — Patient Instructions (Signed)
Will try to find more information about palliative care and what their policies are. We can make a referral once we get more information

## 2016-01-08 NOTE — Progress Notes (Signed)
Subjective:    Patient ID: [name], female    DOB: Aug 10, 1932, 82 y.o.   MRN: [id number]  HPI   Pain Inventory Average Pain 7 Pain Right Now 7 My pain is constant  In the last 24 hours, has pain interfered with the following? General activity 10 Relation with others 10 Enjoyment of life 10 What TIME of day is your pain at its worst? all Sleep (in general) Good  Pain is worse with: unsure Pain improves with: medication Relief from Meds: 5  Mobility walk with assistance use a walker how many minutes can you walk? 3 ability to climb steps?  no do you drive?  no Do you have any goals in this area?  no  Function Do you have any goals in this area?  no  Neuro/Psych No problems in this area  Prior Studies Any changes since last visit?  no  Physicians involved in your care Any changes since last visit?  no   Family History  Problem Relation Age of Onset  . Heart disease Mother   . Cancer Father     throat  . Atrial fibrillation Sister    Social History   Social History  . Marital status: Married    Spouse name: N/A  . Number of children: N/A  . Years of education: N/A   Social History Main Topics  . Smoking status: Current Every Day Smoker    Packs/day: 0.50    Years: 65.00    Types: Cigarettes  . Smokeless tobacco: Never Used  . Alcohol use Yes     Comment: Occasional  . Drug use: No  . Sexual activity: No   Other Topics Concern  . None   Social History Narrative  . None   Past Surgical History:  Procedure Laterality Date  . INTRAMEDULLARY (IM) NAIL INTERTROCHANTERIC Right 06/26/2014   Procedure: INTRAMEDULLARY (IM) NAIL INTERTROCHANTRIC;  Surgeon: Mauri Pole, MD;  [facility];  Service: Orthopedics;  Laterality: Right;  . KNEE ARTHROSCOPY    . right ankle surgery     Past Medical History:  Diagnosis Date  . Alcohol abuse   . ANXIETY DEPRESSION 10/01/2009   Qualifier: Diagnosis of  By: Niel Hummer MD, Lorinda Creed   .  Arthritis   . Chronic back pain    narcotic dependence  . Compression fracture 02/2011   s/p KP  . COPD (chronic obstructive pulmonary disease) ([place])   . Dementia 01/28/2013  . Depression   . Diabetes mellitus without complication ([place]) 123456  . Diverticulosis   . Fall 01/21/2013  . Gastritis    RUT negative, 2005  . HLD (hyperlipidemia) 05/30/2007   Qualifier: Diagnosis of  By: Niel Hummer MD, [place] Hypertension 02/20/2013  . Insomnia 01/28/2013  . Intertrochanteric fracture of right hip ([place]) 06/26/2014  . Lumbar disc disease    dr. Trenton Gammon  . Macular degeneration   . Mitral valve prolapse   . Myocardial infarction ([place])   . Paroxysmal atrial fibrillation (HCC)    chronic anticoag until 01/2013 - stopped due to high fall risk  . Protein-calorie malnutrition, severe ([place]) 06/26/2014  . Right bundle branch block and incomplete left bundle branch block    with left axis deviation  . Tobacco abuse 06/26/2014  . VITAMIN D DEFICIENCY 03/20/2008   Qualifier: Diagnosis of  By: Cori Razor RN, Mikal Plane   . Weight loss, unintentional 02/08/2013   BP 105/66 (BP Location: Right Arm, Patient Position: Sitting,  Cuff Size: Normal)   Pulse 63   Resp 14   SpO2 92%   Opioid Risk Score:   Fall Risk Score:  `1  Depression screen PHQ 2/9  Depression screen [facility] 2/9 01/26/2015 12/30/2014 10/09/2014  Decreased Interest 0 0 0  Down, Depressed, Hopeless 0 0 3  PHQ - 2 Score 0 0 3  Altered sleeping - - 0  Tired, decreased energy - - 2  Change in appetite - - 0  Feeling bad or failure about yourself  - - 1  Trouble concentrating - - 0  Moving slowly or fidgety/restless - - 3  Suicidal thoughts - - 0  PHQ-9 Score - - 9  Some recent data might be hidden    Review of Systems  Constitutional: Negative.   HENT: Negative.   Eyes: Negative.   Respiratory: Negative.   Cardiovascular: Negative.   Gastrointestinal: Negative.   Endocrine: Negative.   Genitourinary: Negative.   Musculoskeletal:  Positive for back pain, gait problem and neck pain.  Skin: Negative.   Allergic/Immunologic: Negative.   Hematological: Negative.   Psychiatric/Behavioral: Negative.   All other systems reviewed and are negative.      Objective:   Physical Exam        Assessment & Plan:

## 2016-01-15 ENCOUNTER — Encounter: Payer: Self-pay | Admitting: Internal Medicine

## 2016-01-28 NOTE — Addendum Note (Signed)
Addended by: Charlett Blake on: 01/28/2016 04:14 PM   Modules accepted: Orders

## 2016-02-01 NOTE — Progress Notes (Deleted)
Patient Care Team: Carol Ada, MD as PCP - General (Family Medicine) Deboraha Sprang, MD (Cardiology) Hurman Horn, MD (Ophthalmology)   HPI  [name] is a 82 y.o. female Seen in follow-up for atrial fibrillation.     Echocardiogram 2011 with an EF of 55-60% with some degree of MS and mild MR. She had been on Coumadin until 9/14 when because of a fall Coumadin was discontinued.  The fall occurred when her feet got caught her pajamas while in the bathroom.  She was when last seen in a wheel chair and the value of anticoagulation was Felix Pacini was agreeable to resuming   She has struggled with chronic narcotic use ;  She still has pain     She continues to struggle with her husband verbally  Records and Results Reviewed hospital  Past Medical History:  Diagnosis Date  . Alcohol abuse   . ANXIETY DEPRESSION 10/01/2009   Qualifier: Diagnosis of  By: Niel Hummer MD, Lorinda Creed   . Arthritis   . Chronic back pain    narcotic dependence  . Compression fracture 02/2011   s/p KP  . COPD (chronic obstructive pulmonary disease) ([place])   . Dementia 01/28/2013  . Depression   . Diabetes mellitus without complication ([place]) 123456  . Diverticulosis   . Fall 01/21/2013  . Gastritis    RUT negative, 2005  . HLD (hyperlipidemia) 05/30/2007   Qualifier: Diagnosis of  By: Niel Hummer MD, [place] Hypertension 02/20/2013  . Insomnia 01/28/2013  . Intertrochanteric fracture of right hip ([place]) 06/26/2014  . Lumbar disc disease    dr. Trenton Gammon  . Macular degeneration   . Mitral valve prolapse   . Myocardial infarction ([place])   . Paroxysmal atrial fibrillation (HCC)    chronic anticoag until 01/2013 - stopped due to high fall risk  . Protein-calorie malnutrition, severe ([place]) 06/26/2014  . Right bundle branch block and incomplete left bundle branch block    with left axis deviation  . Tobacco abuse 06/26/2014  . VITAMIN D DEFICIENCY 03/20/2008   Qualifier: Diagnosis of  By:  Cori Razor RN, Mikal Plane   . Weight loss, unintentional 02/08/2013    Past Surgical History:  Procedure Laterality Date  . INTRAMEDULLARY (IM) NAIL INTERTROCHANTERIC Right 06/26/2014   Procedure: INTRAMEDULLARY (IM) NAIL INTERTROCHANTRIC;  Surgeon: Mauri Pole, MD;  [facility];  Service: Orthopedics;  Laterality: Right;  . KNEE ARTHROSCOPY    . right ankle surgery      Current Outpatient Prescriptions  Medication Sig Dispense Refill  . acetaminophen (TYLENOL) 500 MG tablet Take 500 mg by mouth every 8 (eight) hours as needed for moderate pain or headache.    . b complex vitamins tablet Take 1 tablet by mouth daily.    . Calcium Carb-Cholecalciferol (CALCIUM 600-D PO) Take 600 mg by mouth daily.    . Cholecalciferol (VITAMIN D) 1000 UNITS capsule Take 1,200 Units by mouth daily.     . diazepam (VALIUM) 5 MG tablet Take 1 tablet (5 mg total) by mouth every 6 (six) hours as needed. for anxiety 30 tablet 0  . fentaNYL (DURAGESIC - DOSED MCG/HR) 50 MCG/HR Place 1 patch (50 mcg total) onto the skin every 3 (three) days. 10 patch 0  . HYDROcodone-acetaminophen (NORCO) 7.5-325 MG tablet Take 1 tablet by mouth 2 (two) times daily. 60 tablet 0  . memantine (NAMENDA) 10 MG tablet Take 10 mg by mouth 2 (two) times  daily.  0  . metFORMIN (GLUCOPHAGE) 500 MG tablet Take 1 tablet (500 mg total) by mouth daily with breakfast. (Patient taking differently: Take 500 mg by mouth 2 (two) times daily. ) 90 tablet 2  . metoprolol tartrate (LOPRESSOR) 25 MG tablet Take 1 tablet (25 mg total) by mouth 2 (two) times daily. 60 tablet 6  . Multiple Vitamin (MULTIVITAMIN WITH MINERALS) TABS tablet Take 1 tablet by mouth daily.    Marland Kitchen PARoxetine (PAXIL) 40 MG tablet TAKE 1 TABLET (40 MG TOTAL) BY MOUTH EVERY MORNING. 30 tablet 5  . polyethylene glycol powder (GLYCOLAX/MIRALAX) powder Take 17 g by mouth daily. (Patient taking differently: Take 17 g by mouth daily as needed for mild constipation. ) 3350 g 1  . pravastatin  (PRAVACHOL) 80 MG tablet TAKE 1 TABLET (80 MG TOTAL) BY MOUTH DAILY. 30 tablet 10  . temazepam (RESTORIL) 30 MG capsule Take 30 mg by mouth at bedtime as needed for sleep (sleep).    . verapamil (CALAN) 120 MG tablet Take 120 mg by mouth 2 (two) times daily.  1  . warfarin (COUMADIN) 5 MG tablet TAKE AS DIRECTED BY COUMADIN CLINIC 30 tablet 3   No current facility-administered medications for this visit.     Allergies  Allergen Reactions  . Codeine Other (See Comments)    unknown  . Morphine And Related Other (See Comments)    Pt had severe respiratory depression and excessive sedation during a recent hospitalization and was told it was 2/2 morphine.      Review of Systems negative except from HPI and PMH  Physical Exam There were no vitals taken for this visit. Well developed and well nourished in no acute distress HENT normal E scleral and icterus clear Neck Supple JVP flat; carotids brisk and full Clear to ausculation  Regular rate and rhythm, no murmurs gallops or rub Soft with active bowel sounds No clubbing cyanosis Trace Edema Alert and oriented, grossly normal motor and sensory function Skin Warm and Dry    Assessment and  Plan  Atrial fibrillation-paroxysmal  Fall risk  Mitral valvular disease with a calcified and restricted mitral valve leaflets without  Stenosis  Anemia  This patients CHA2DS2-VASc Score and unadjusted Ischemic Stroke Rate (% per year) is equal to 7.2 % stroke rate/year from a score of 5  Above score calculated as 1 point each if present [CHF, HTN, DM, Vascular=MI/PAD/Aortic Plaque, Age if 65-74, or Female] Above score calculated as 2 points each if present [Age > 75, or Stroke/TIA/TE]  She continues to have some paroxysms. We will need to clarify her medications. According to emergency verapamil and metoprolol will be MAR suggested discontinuing 10/16.  whe will continue on amiodarone

## 2016-02-02 ENCOUNTER — Ambulatory Visit: Payer: Medicare Other | Admitting: Internal Medicine

## 2016-02-12 ENCOUNTER — Encounter: Payer: Medicare Other | Attending: Physical Medicine & Rehabilitation | Admitting: Registered Nurse

## 2016-02-12 ENCOUNTER — Encounter: Payer: Self-pay | Admitting: Registered Nurse

## 2016-02-12 VITALS — BP 108/63 | HR 69

## 2016-02-12 DIAGNOSIS — M5030 Other cervical disc degeneration, unspecified cervical region: Secondary | ICD-10-CM | POA: Diagnosis not present

## 2016-02-12 DIAGNOSIS — Z79899 Other long term (current) drug therapy: Secondary | ICD-10-CM | POA: Diagnosis not present

## 2016-02-12 DIAGNOSIS — E1142 Type 2 diabetes mellitus with diabetic polyneuropathy: Secondary | ICD-10-CM | POA: Insufficient documentation

## 2016-02-12 DIAGNOSIS — M4806 Spinal stenosis, lumbar region: Secondary | ICD-10-CM | POA: Diagnosis not present

## 2016-02-12 DIAGNOSIS — M2010 Hallux valgus (acquired), unspecified foot: Secondary | ICD-10-CM | POA: Insufficient documentation

## 2016-02-12 DIAGNOSIS — Z5181 Encounter for therapeutic drug level monitoring: Secondary | ICD-10-CM | POA: Diagnosis not present

## 2016-02-12 DIAGNOSIS — G894 Chronic pain syndrome: Secondary | ICD-10-CM | POA: Diagnosis not present

## 2016-02-12 DIAGNOSIS — M48061 Spinal stenosis, lumbar region without neurogenic claudication: Secondary | ICD-10-CM

## 2016-02-12 MED ORDER — HYDROCODONE-ACETAMINOPHEN 7.5-325 MG PO TABS: 1.0000 | ORAL_TABLET | Freq: Two times a day (BID) | ORAL | 0 refills | Status: DC

## 2016-02-12 NOTE — Progress Notes (Signed)
Subjective:    Patient ID: [name], female    DOB: 1932/08/11, 82 y.o.   MRN: [id number]  HPI: [name] is a 82 year old female who returns for follow up for chronic pain and medication refill. She states her pain is located in her lower back.She rates her pain 7. Her current exercise regime is chair exercises twice a day and walks with a walker in her home.  [name] didn't bring her Fentanyl box, husband states it was empty, according to [facility] last prescription of Fentanyl was picked up on 01/02/2016. She has a [prescription ready for pick up at the pharmacy he states.   Pain Inventory Average Pain 7 Pain Right Now 7 My pain is constant  In the last 24 hours, has pain interfered with the following? General activity 7 Relation with others 7 Enjoyment of life 9 What TIME of day is your pain at its worst? All Sleep (in general) Fair  Pain is worse with: walking, bending, sitting, inactivity, standing, unsure and some activites Pain improves with: medication Relief from Meds: fair  Mobility use a walker how many minutes can you walk? 10 maybe ability to climb steps?  no do you drive?  no use a wheelchair  Function retired I need assistance with the following:  meal prep, household duties and shopping  Neuro/Psych numbness tingling trouble walking confusion  Prior Studies Any changes since last visit?  no  Physicians involved in your care Any changes since last visit?  no   Family History  Problem Relation Age of Onset  . Heart disease Mother   . Cancer Father     throat  . Atrial fibrillation Sister    Social History   Social History  . Marital status: Married    Spouse name: N/A  . Number of children: N/A  . Years of education: N/A   Social History Main Topics  . Smoking status: Current Every Day Smoker    Packs/day: 0.50    Years: 65.00    Types: Cigarettes  . Smokeless tobacco: Never Used  . Alcohol use Yes     Comment: Occasional    . Drug use: No  . Sexual activity: No   Other Topics Concern  . Not on file   Social History Narrative  . No narrative on file   Past Surgical History:  Procedure Laterality Date  . INTRAMEDULLARY (IM) NAIL INTERTROCHANTERIC Right 06/26/2014   Procedure: INTRAMEDULLARY (IM) NAIL INTERTROCHANTRIC;  Surgeon: Mauri Pole, MD;  [facility];  Service: Orthopedics;  Laterality: Right;  . KNEE ARTHROSCOPY    . right ankle surgery     Past Medical History:  Diagnosis Date  . Alcohol abuse   . ANXIETY DEPRESSION 10/01/2009   Qualifier: Diagnosis of  By: Niel Hummer MD, Lorinda Creed   . Arthritis   . Chronic back pain    narcotic dependence  . Compression fracture 02/2011   s/p KP  . COPD (chronic obstructive pulmonary disease) ([place])   . Dementia 01/28/2013  . Depression   . Diabetes mellitus without complication ([place]) 123456  . Diverticulosis   . Fall 01/21/2013  . Gastritis    RUT negative, 2005  . HLD (hyperlipidemia) 05/30/2007   Qualifier: Diagnosis of  By: Niel Hummer MD, [place] Hypertension 02/20/2013  . Insomnia 01/28/2013  . Intertrochanteric fracture of right hip ([place]) 06/26/2014  . Lumbar disc disease    dr. Trenton Gammon  . Macular degeneration   .  Mitral valve prolapse   . Myocardial infarction ([place])   . Paroxysmal atrial fibrillation (HCC)    chronic anticoag until 01/2013 - stopped due to high fall risk  . Protein-calorie malnutrition, severe ([place]) 06/26/2014  . Right bundle branch block and incomplete left bundle branch block    with left axis deviation  . Tobacco abuse 06/26/2014  . VITAMIN D DEFICIENCY 03/20/2008   Qualifier: Diagnosis of  By: Cori Razor RN, Mikal Plane   . Weight loss, unintentional 02/08/2013   There were no vitals taken for this visit.  Opioid Risk Score:   Fall Risk Score:  `1  Depression screen PHQ 2/9  Depression screen [facility] 2/9 01/26/2015 12/30/2014 10/09/2014  Decreased Interest 0 0 0  Down, Depressed, Hopeless 0 0 3  PHQ - 2 Score 0 0 3   Altered sleeping - - 0  Tired, decreased energy - - 2  Change in appetite - - 0  Feeling bad or failure about yourself  - - 1  Trouble concentrating - - 0  Moving slowly or fidgety/restless - - 3  Suicidal thoughts - - 0  PHQ-9 Score - - 9  Some recent data might be hidden     Review of Systems  Musculoskeletal: Positive for gait problem.  Neurological: Positive for numbness.       Tingling  Psychiatric/Behavioral: Positive for confusion.  All other systems reviewed and are negative.      Objective:   Physical Exam  Constitutional: She is oriented to person, place, and time. She appears well-developed and well-nourished.  HENT:  Head: Normocephalic and atraumatic.  Neck: Normal range of motion. Neck supple.  Cardiovascular: Normal rate and regular rhythm.   Pulmonary/Chest: Effort normal and breath sounds normal.  Musculoskeletal:  Normal Muscle Bulk and Muscle Testing Reveals: Upper Extremities: Full ROM and Muscle Strength 5/5 Lumbar Paraspinal Tenderness: L-4- L-5 Lower Extremities: Full ROM and Muscle Strength 5/5 Arrived in wheelchair   Neurological: She is alert and oriented to person, place, and time.  Skin: Skin is warm and dry.  Psychiatric: She has a normal mood and affect.  Nursing note and vitals reviewed.         Assessment & Plan:  1. Severe cervical and lumbar degenerative disc with chronic spine pain.Continue Fentanyl 50 mcg one patch every three days #10 and Refilled: Hydrocodone 5/325 mg one tablet twice a day as needed for moderate pain. #60.  We will continue the opioid monitoring program, this consists of regular clinic visits, examinations, urine drug screen, pill counts as well as use of [place] Controlled Substance Reporting System. 2. Lumbar spinal stenosis with nerve root compression left S1. Continue to Monitor. 3. Diabetic neuropathy severe: Continue to monitor 4. Kyphoscoliosis: Continue to Monitor / Poor Mobility 5.  Bilateral Knee Pain: No complaints . Ortho Following  20 minutes of face to face patient care time was spent during this visit. All questions were encouraged and answered.  F/U in 1 month

## 2016-02-12 NOTE — Addendum Note (Signed)
Addended by: Valere Dross on: 02/12/2016 03:45 PM   Modules accepted: Orders

## 2016-02-20 LAB — TOXASSURE SELECT,+ANTIDEPR,UR

## 2016-02-22 NOTE — Progress Notes (Signed)
Urine drug screen for this encounter is consistent for prescribed medication 

## 2016-02-26 ENCOUNTER — Telehealth: Payer: Self-pay | Admitting: Internal Medicine

## 2016-02-26 NOTE — Telephone Encounter (Addendum)
Melissa, scheduler, calling patient to arrange.

## 2016-02-26 NOTE — Telephone Encounter (Signed)
Pt's son called  To reschedule can. Appt. Please give him a call back to do so.

## 2016-03-05 ENCOUNTER — Other Ambulatory Visit: Payer: Self-pay | Admitting: Internal Medicine

## 2016-03-07 ENCOUNTER — Ambulatory Visit (INDEPENDENT_AMBULATORY_CARE_PROVIDER_SITE_OTHER): Payer: Medicare Other | Admitting: Pharmacist

## 2016-03-07 DIAGNOSIS — I480 Paroxysmal atrial fibrillation: Secondary | ICD-10-CM

## 2016-03-07 DIAGNOSIS — Z5181 Encounter for therapeutic drug level monitoring: Secondary | ICD-10-CM

## 2016-03-07 LAB — POCT INR: INR: 5.3

## 2016-03-07 MED ORDER — WARFARIN SODIUM 5 MG PO TABS: ORAL_TABLET | ORAL | 0 refills | Status: AC

## 2016-03-15 ENCOUNTER — Ambulatory Visit: Payer: Medicare Other | Admitting: Internal Medicine

## 2016-03-16 ENCOUNTER — Encounter: Payer: Medicare Other | Admitting: Registered Nurse

## 2016-03-16 ENCOUNTER — Telehealth: Payer: Self-pay | Admitting: *Deleted

## 2016-03-16 NOTE — Telephone Encounter (Signed)
[name] called and said she has fallen 3 times and had to cancel her appt she had today. She is rescheduled for 03/30/16 with Zella Ball. I called the number back and got Mr Selzler.  He is very frustrated.  He said she is asleep in her chair about 5 miles away (I guess he was at work) but that she is worsening in her dementia and he feels it is her spiraling her way down the drain.  He guesses if her pain bothers her enough she will be willing to come in but he doesn't care.  He is having trouble getting her to eat or take her medicine. He says he is ready to "stick her in an assisted living and let someone else deal with her."

## 2016-03-17 NOTE — Telephone Encounter (Signed)
Needs to talk to PCP to arrange assisted living

## 2016-03-18 ENCOUNTER — Ambulatory Visit (INDEPENDENT_AMBULATORY_CARE_PROVIDER_SITE_OTHER): Payer: Medicare Other | Admitting: Pharmacist

## 2016-03-18 DIAGNOSIS — I480 Paroxysmal atrial fibrillation: Secondary | ICD-10-CM | POA: Diagnosis not present

## 2016-03-18 DIAGNOSIS — Z5181 Encounter for therapeutic drug level monitoring: Secondary | ICD-10-CM

## 2016-03-18 LAB — POCT INR: INR: 1.9

## 2016-03-30 ENCOUNTER — Encounter: Payer: Self-pay | Admitting: Registered Nurse

## 2016-03-30 ENCOUNTER — Encounter: Payer: Medicare Other | Attending: Physical Medicine & Rehabilitation | Admitting: Registered Nurse

## 2016-03-30 VITALS — BP 122/74 | HR 59 | Resp 16

## 2016-03-30 DIAGNOSIS — G894 Chronic pain syndrome: Secondary | ICD-10-CM | POA: Diagnosis not present

## 2016-03-30 DIAGNOSIS — M2010 Hallux valgus (acquired), unspecified foot: Secondary | ICD-10-CM | POA: Diagnosis not present

## 2016-03-30 DIAGNOSIS — Z5181 Encounter for therapeutic drug level monitoring: Secondary | ICD-10-CM | POA: Insufficient documentation

## 2016-03-30 DIAGNOSIS — Z79899 Other long term (current) drug therapy: Secondary | ICD-10-CM | POA: Diagnosis not present

## 2016-03-30 DIAGNOSIS — E1142 Type 2 diabetes mellitus with diabetic polyneuropathy: Secondary | ICD-10-CM | POA: Insufficient documentation

## 2016-03-30 DIAGNOSIS — M5030 Other cervical disc degeneration, unspecified cervical region: Secondary | ICD-10-CM | POA: Insufficient documentation

## 2016-03-30 DIAGNOSIS — M48061 Spinal stenosis, lumbar region without neurogenic claudication: Secondary | ICD-10-CM | POA: Diagnosis not present

## 2016-03-30 MED ORDER — HYDROCODONE-ACETAMINOPHEN 7.5-325 MG PO TABS: 1.0000 | ORAL_TABLET | Freq: Two times a day (BID) | ORAL | 0 refills | Status: AC

## 2016-03-30 NOTE — Progress Notes (Signed)
Subjective:    Patient ID: [name], female    DOB: 1933-02-03, 82 y.o.   MRN: [id number]  HPI:  [name] is a 82 year old female who returns for follow up for chronic pain and medication refill. She states her pain is located in her mid- lower back.She rates her pain 7. Her current exercise regime is chair exercises and walking with a walker in her home.    Mr. Rheams states [name] prescription for Fentanyl is at the pharmacy and he will be picking up her patches today and no prescription is needed. According to [facility] last prescription of Fentanyl was picked up on 01/02/2016. I asked Mr. Lavell regarding the above, he states " he usually puts her fentanyl patches on every 4 th or 5th day depending on her pain . [name] has a patch on today, right upper back. [name] states her husband applies the patches on her. Will continue to monitor.   Pain Inventory Average Pain 7 Pain Right Now 7 My pain is constant  In the last 24 hours, has pain interfered with the following? General activity 7 Relation with others 7 Enjoyment of life 9 What TIME of day is your pain at its worst? all Sleep (in general) Fair  Pain is worse with: walking, bending, sitting, standing and some activites Pain improves with: medication Relief from Meds: 6  Mobility walk with assistance use a walker  Function retired I need assistance with the following:  dressing, bathing, toileting, meal prep, household duties and shopping  Neuro/Psych numbness tingling trouble walking confusion  Prior Studies Any changes since last visit?  no  Physicians involved in your care Any changes since last visit?  no   Family History  Problem Relation Age of Onset  . Heart disease Mother   . Cancer Father     throat  . Atrial fibrillation Sister    Social History   Social History  . Marital status: Married    Spouse name: N/A  . Number of children: N/A  . Years of education: N/A   Social History  Main Topics  . Smoking status: Current Every Day Smoker    Packs/day: 0.50    Years: 65.00    Types: Cigarettes  . Smokeless tobacco: Never Used  . Alcohol use Yes     Comment: Occasional  . Drug use: No  . Sexual activity: No   Other Topics Concern  . None   Social History Narrative  . None   Past Surgical History:  Procedure Laterality Date  . INTRAMEDULLARY (IM) NAIL INTERTROCHANTERIC Right 06/26/2014   Procedure: INTRAMEDULLARY (IM) NAIL INTERTROCHANTRIC;  Surgeon: Mauri Pole, MD;  Location: [facility];  Service: Orthopedics;  Laterality: Right;  . KNEE ARTHROSCOPY    . right ankle surgery     Past Medical History:  Diagnosis Date  . Alcohol abuse   . ANXIETY DEPRESSION 10/01/2009   Qualifier: Diagnosis of  By: Niel Hummer MD, Lorinda Creed   . Arthritis   . Chronic back pain    narcotic dependence  . Compression fracture 02/2011   s/p KP  . COPD (chronic obstructive pulmonary disease) ([place])   . Dementia 01/28/2013  . Depression   . Diabetes mellitus without complication ([place]) 123456  . Diverticulosis   . Fall 01/21/2013  . Gastritis    RUT negative, 2005  . HLD (hyperlipidemia) 05/30/2007   Qualifier: Diagnosis of  By: Niel Hummer MD, [place]  Hypertension 02/20/2013  . Insomnia 01/28/2013  . Intertrochanteric fracture of right hip ([place]) 06/26/2014  . Lumbar disc disease    dr. [place] Gammon  . Macular degeneration   . Mitral valve prolapse   . Myocardial infarction   . Paroxysmal atrial fibrillation ([facility])    chronic anticoag until 01/2013 - stopped due to high fall risk  . Protein-calorie malnutrition, severe ([place]) 06/26/2014  . Right bundle branch block and incomplete left bundle branch block    with left axis deviation  . Tobacco abuse 06/26/2014  . VITAMIN D DEFICIENCY 03/20/2008   Qualifier: Diagnosis of  By: Cori Razor RN, Mikal Plane   . Weight loss, unintentional 02/08/2013   BP 122/74   Pulse (!) 59   Resp 16   SpO2 90%   Opioid Risk Score:   Fall Risk Score:   `1  Depression screen PHQ 2/9  Depression screen [facility] 2/9 03/30/2016 01/26/2015 12/30/2014 10/09/2014  Decreased Interest 0 0 0 0  Down, Depressed, Hopeless 0 0 0 3  PHQ - 2 Score 0 0 0 3  Altered sleeping - - - 0  Tired, decreased energy - - - 2  Change in appetite - - - 0  Feeling bad or failure about yourself  - - - 1  Trouble concentrating - - - 0  Moving slowly or fidgety/restless - - - 3  Suicidal thoughts - - - 0  PHQ-9 Score - - - 9  Some recent data might be hidden   Review of Systems  Constitutional: Negative.   HENT: Negative.   Eyes: Negative.   Respiratory: Negative.   Cardiovascular: Negative.   Gastrointestinal: Negative.   Endocrine: Negative.   Genitourinary: Negative.   Musculoskeletal: Negative.   Skin: Negative.   Allergic/Immunologic: Negative.   Neurological: Negative.   Hematological: Negative.   Psychiatric/Behavioral: Negative.   All other systems reviewed and are negative.      Objective:   Physical Exam        Assessment & Plan:  1. Severe cervical and lumbar degenerative disc with chronic spine pain.Continue Fentanyl 50 mcg one patch every three days #10 and Refilled: Hydrocodone 7.5/325 mg one tablet twice a day as needed for moderate pain. #60.  We will continue the opioid monitoring program, this consists of regular clinic visits, examinations, urine drug screen, pill counts as well as use of [place] Controlled Substance Reporting System. 2. Lumbar spinal stenosis with nerve root compression left S1. Continue to Monitor. 3. Diabetic neuropathy severe: Continue to monitor 4. Kyphoscoliosis: Continue to Monitor / Poor Mobility 5. Bilateral Knee Pain: No complaints . Ortho Following  20 minutes of face to face patient care time was spent during this visit. All questions were encouraged and answered.  F/U in 1 month

## 2016-04-18 ENCOUNTER — Ambulatory Visit (INDEPENDENT_AMBULATORY_CARE_PROVIDER_SITE_OTHER): Payer: Medicare Other | Admitting: Pharmacist Clinician (PhC)/ Clinical Pharmacy Specialist

## 2016-04-18 DIAGNOSIS — Z5181 Encounter for therapeutic drug level monitoring: Secondary | ICD-10-CM

## 2016-04-18 DIAGNOSIS — I480 Paroxysmal atrial fibrillation: Secondary | ICD-10-CM

## 2016-04-18 LAB — POCT INR: INR: 1.2

## 2016-04-27 ENCOUNTER — Encounter: Payer: Medicare Other | Admitting: Registered Nurse

## 2016-04-29 ENCOUNTER — Ambulatory Visit (INDEPENDENT_AMBULATORY_CARE_PROVIDER_SITE_OTHER): Payer: Medicare Other | Admitting: Pharmacist

## 2016-04-29 DIAGNOSIS — I480 Paroxysmal atrial fibrillation: Secondary | ICD-10-CM

## 2016-04-29 DIAGNOSIS — Z5181 Encounter for therapeutic drug level monitoring: Secondary | ICD-10-CM

## 2016-04-29 LAB — POCT INR: INR: 2.1

## 2016-05-04 ENCOUNTER — Telehealth: Payer: Self-pay | Admitting: Physical Medicine & Rehabilitation

## 2016-05-16 NOTE — Telephone Encounter (Signed)
Spoke to husband who states his wife was smoking in her chair while he was out on errands.  Had run out of hydrocodone about a week ago, was still on Fentanyl patch.  Husband asking about disposal of remaining 8-9 fentanyl patches.  He was appreciative of the care she received at our clinic.
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
## 2016-05-16 DEATH — deceased

## 2016-05-17 ENCOUNTER — Ambulatory Visit: Payer: Medicare Other | Admitting: Registered Nurse

## 2016-05-23 ENCOUNTER — Ambulatory Visit: Payer: Medicare Other | Admitting: Registered Nurse

## 2016-05-23 ENCOUNTER — Ambulatory Visit: Payer: Medicare Other | Admitting: Internal Medicine

## 2016-05-25 ENCOUNTER — Ambulatory Visit: Payer: Medicare Other | Admitting: Registered Nurse

## 2016-09-16 IMAGING — CT CT HEAD W/O CM
2 series · 15 of 30 positions shown, 19 images · non-contrast
Comparison: Head CTs without contrast 06/26/2014 and earlier. Brain
MRI 09/16/2009.

CLINICAL DATA: 82-year-old female with altered mental status since
the middle of the night. Initial encounter.

EXAM:
CT HEAD WITHOUT CONTRAST
TECHNIQUE: Contiguous axial images were obtained from the base of the skull
through the vertex without intravenous contrast.

[Series 2: head w/o · axial · non-contrast · 0.45mm/px · z∈[-115,+5]mm · 13 of 29 slices shown, 17 images]
[im 3/29  brain]
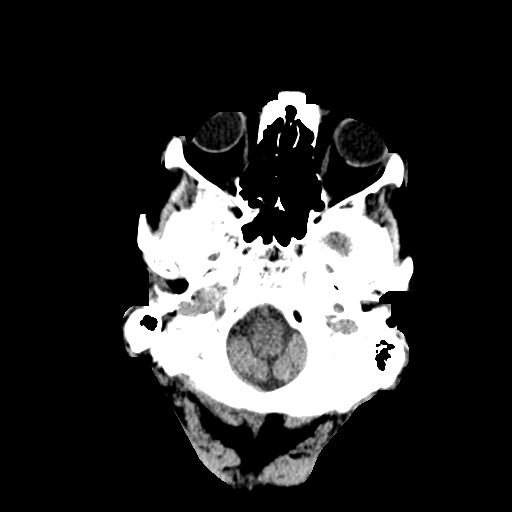
[im 3/29  bone]
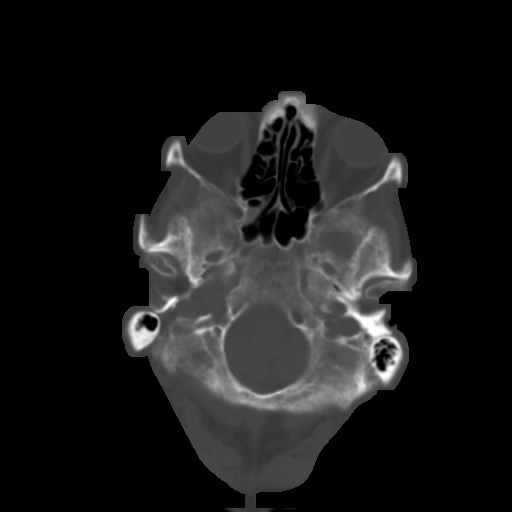
[im 5/29  brain]
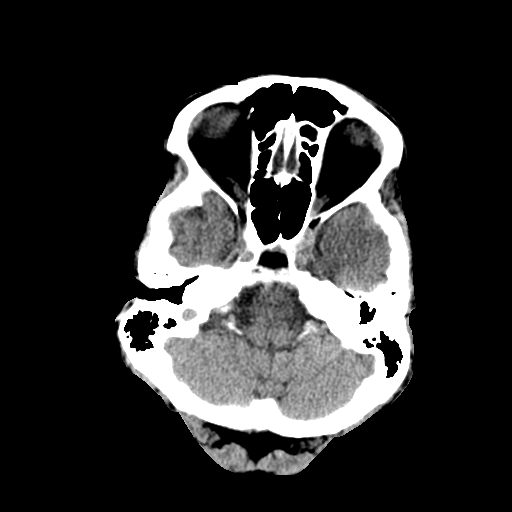
[im 7/29  brain]
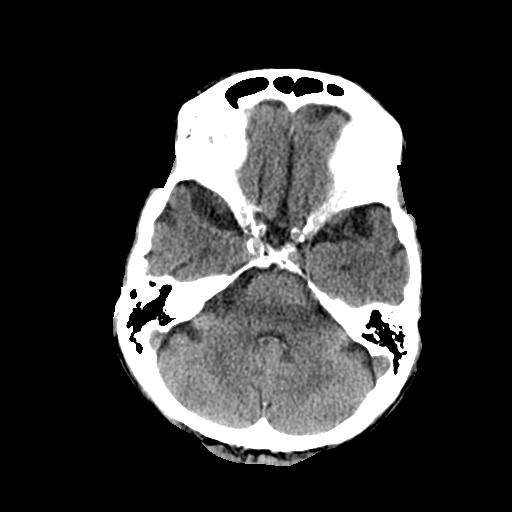
[im 9/29  brain]
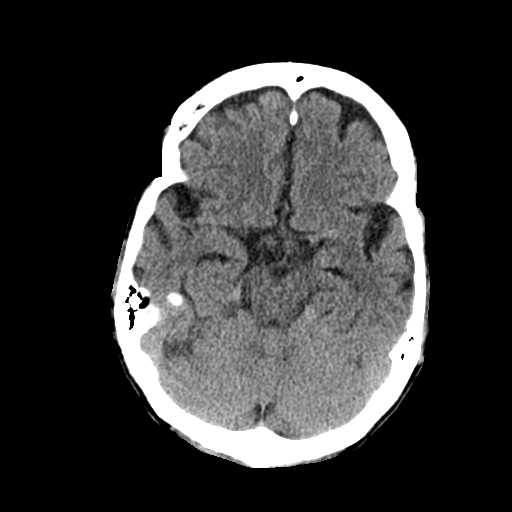
[im 11/29  brain]
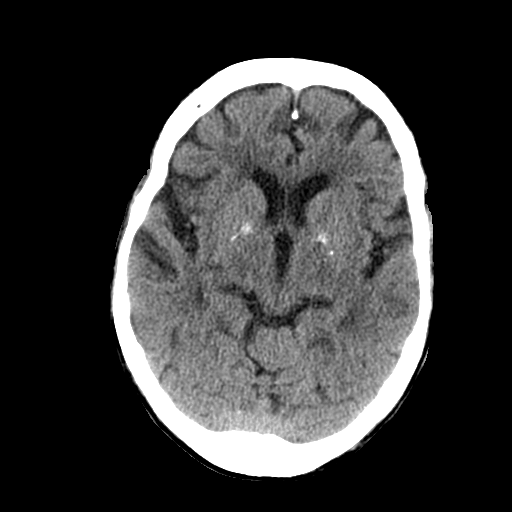
[im 11/29  bone]
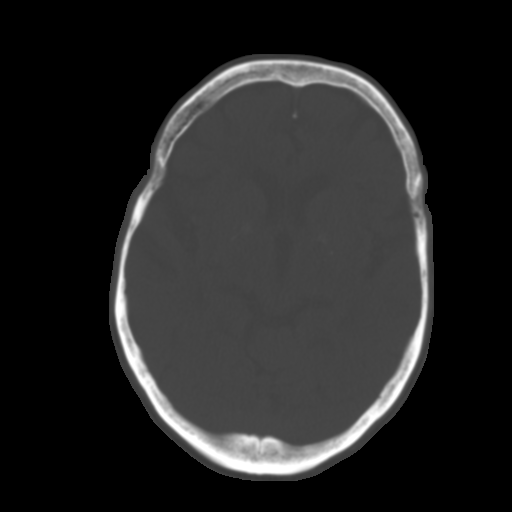
[im 13/29  brain]
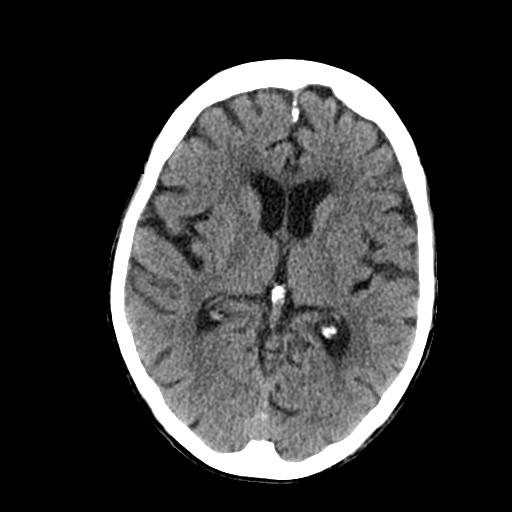
[im 15/29  brain]
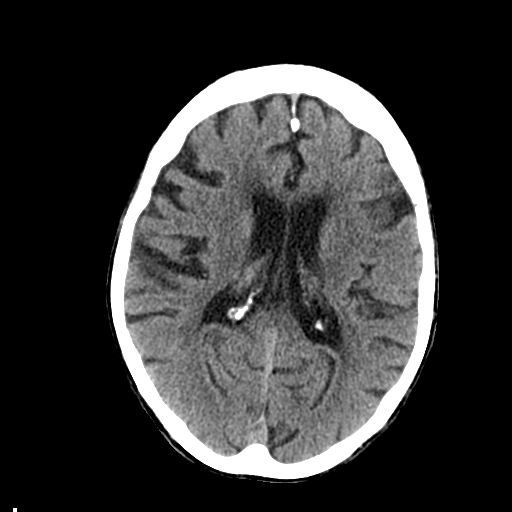
[im 17/29  brain]
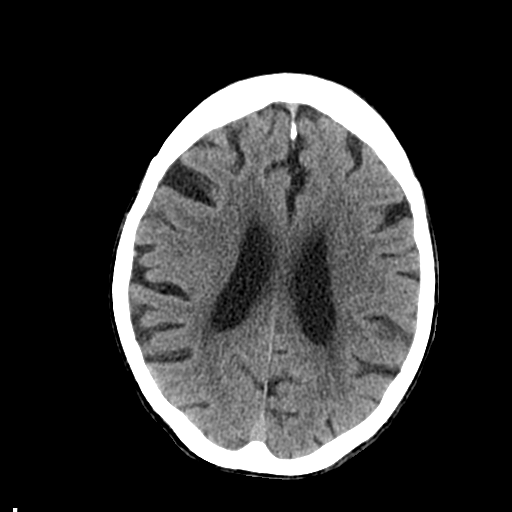
[im 19/29  brain]
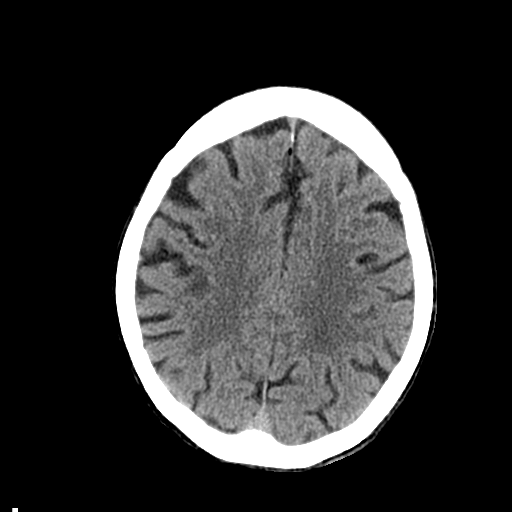
[im 19/29  bone]
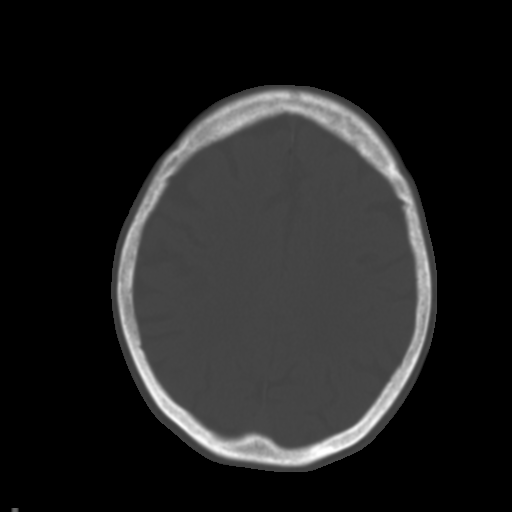
[im 21/29  brain]
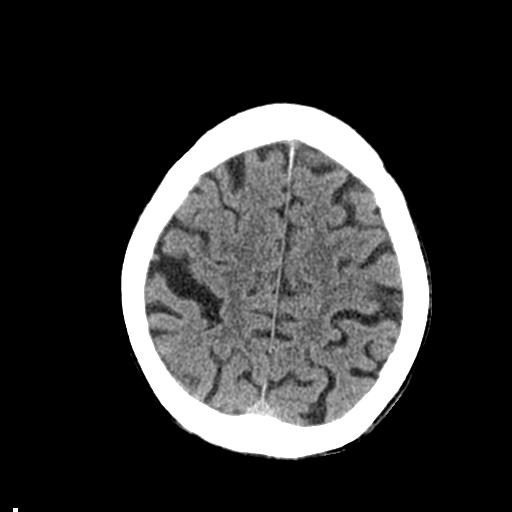
[im 23/29  brain]
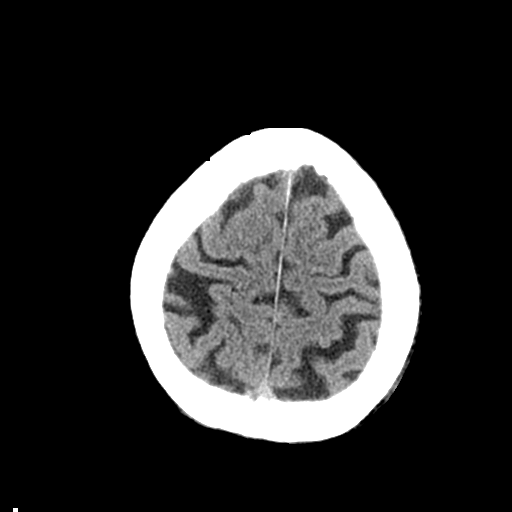
[im 25/29  brain]
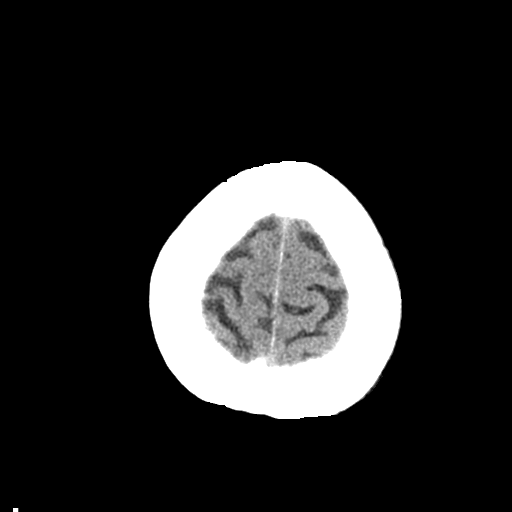
[im 27/29  brain]
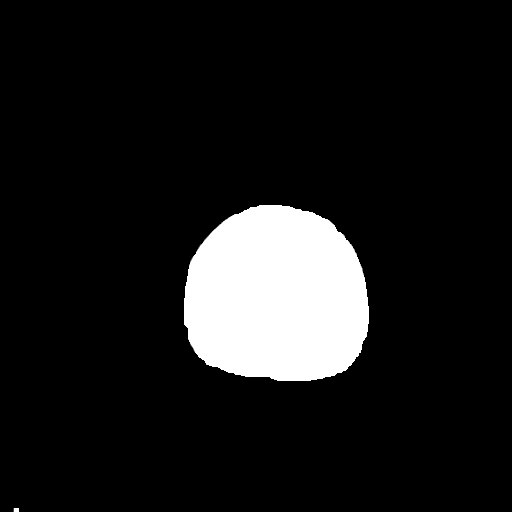
[im 27/29  bone]
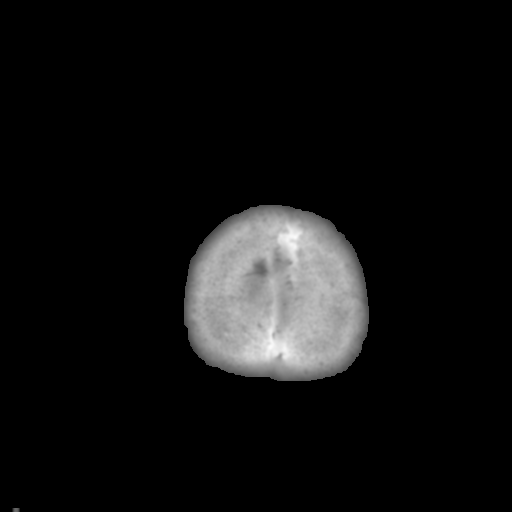

[Series 3: bone windows · axial · 0.45mm/px · z∈[-115,-95]mm · 2 of 29 slices shown]
[im 3/29  bone]
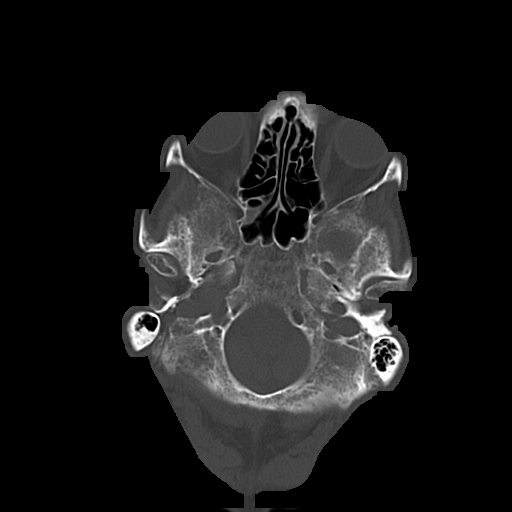
[im 7/29  bone]
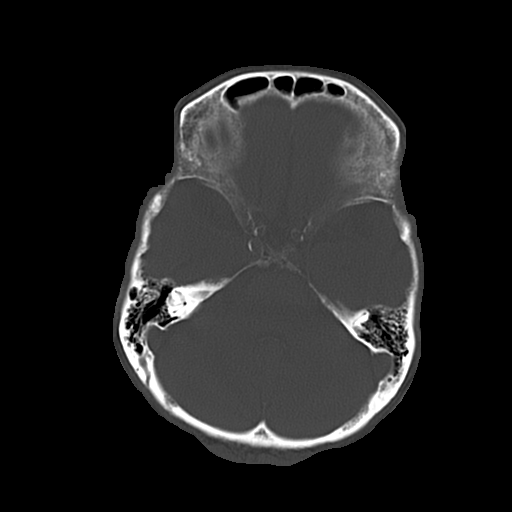

[15 of 30 positions shown; findings below may reference images not displayed]

FINDINGS: Chronic mild right sphenoid sinus opacification has not
significantly changed. Other Visualized paranasal sinuses and
mastoids are clear. Osteopenia. No acute osseous abnormality
identified. No acute orbit or scalp soft tissue findings.

Calcified atherosclerosis at the skull base. No midline shift,
ventriculomegaly, mass effect, evidence of mass lesion, intracranial
hemorrhage or evidence of cortically based acute infarction.
Gray-white matter differentiation is within normal limits throughout
the brain. Mild dystrophic basal ganglia calcifications again noted.
No suspicious intracranial vascular hyperdensity.
IMPRESSION: Stable and normal for age noncontrast CT appearance of the brain.

## 2016-09-16 IMAGING — CR DG CHEST 2V
2 series · 2 of 2 positions shown · non-contrast
Comparison: 06/26/2014

CLINICAL DATA: Weakness.  Disorientation.

EXAM:
CHEST  2 VIEW

[w chest lat]
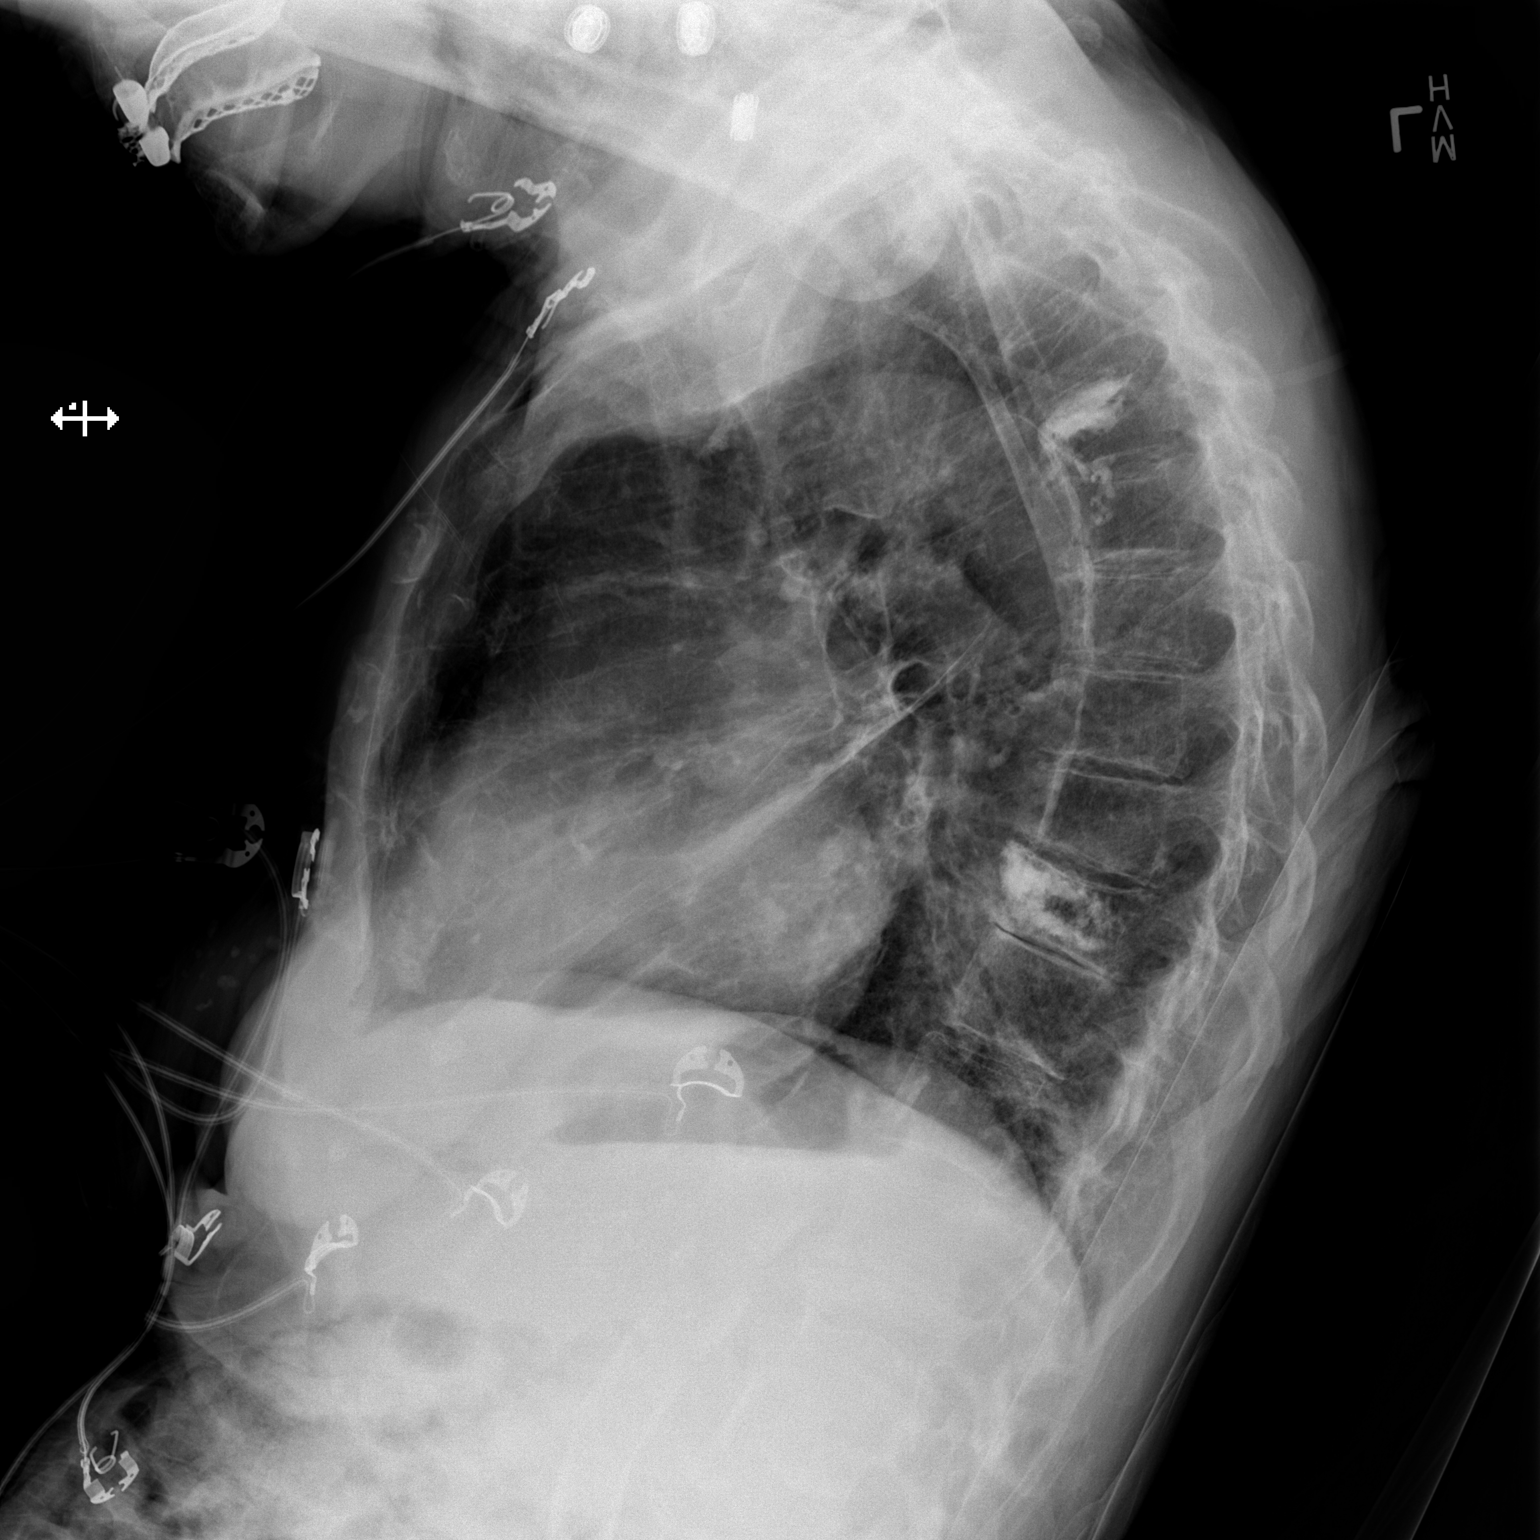

[x chest ap]
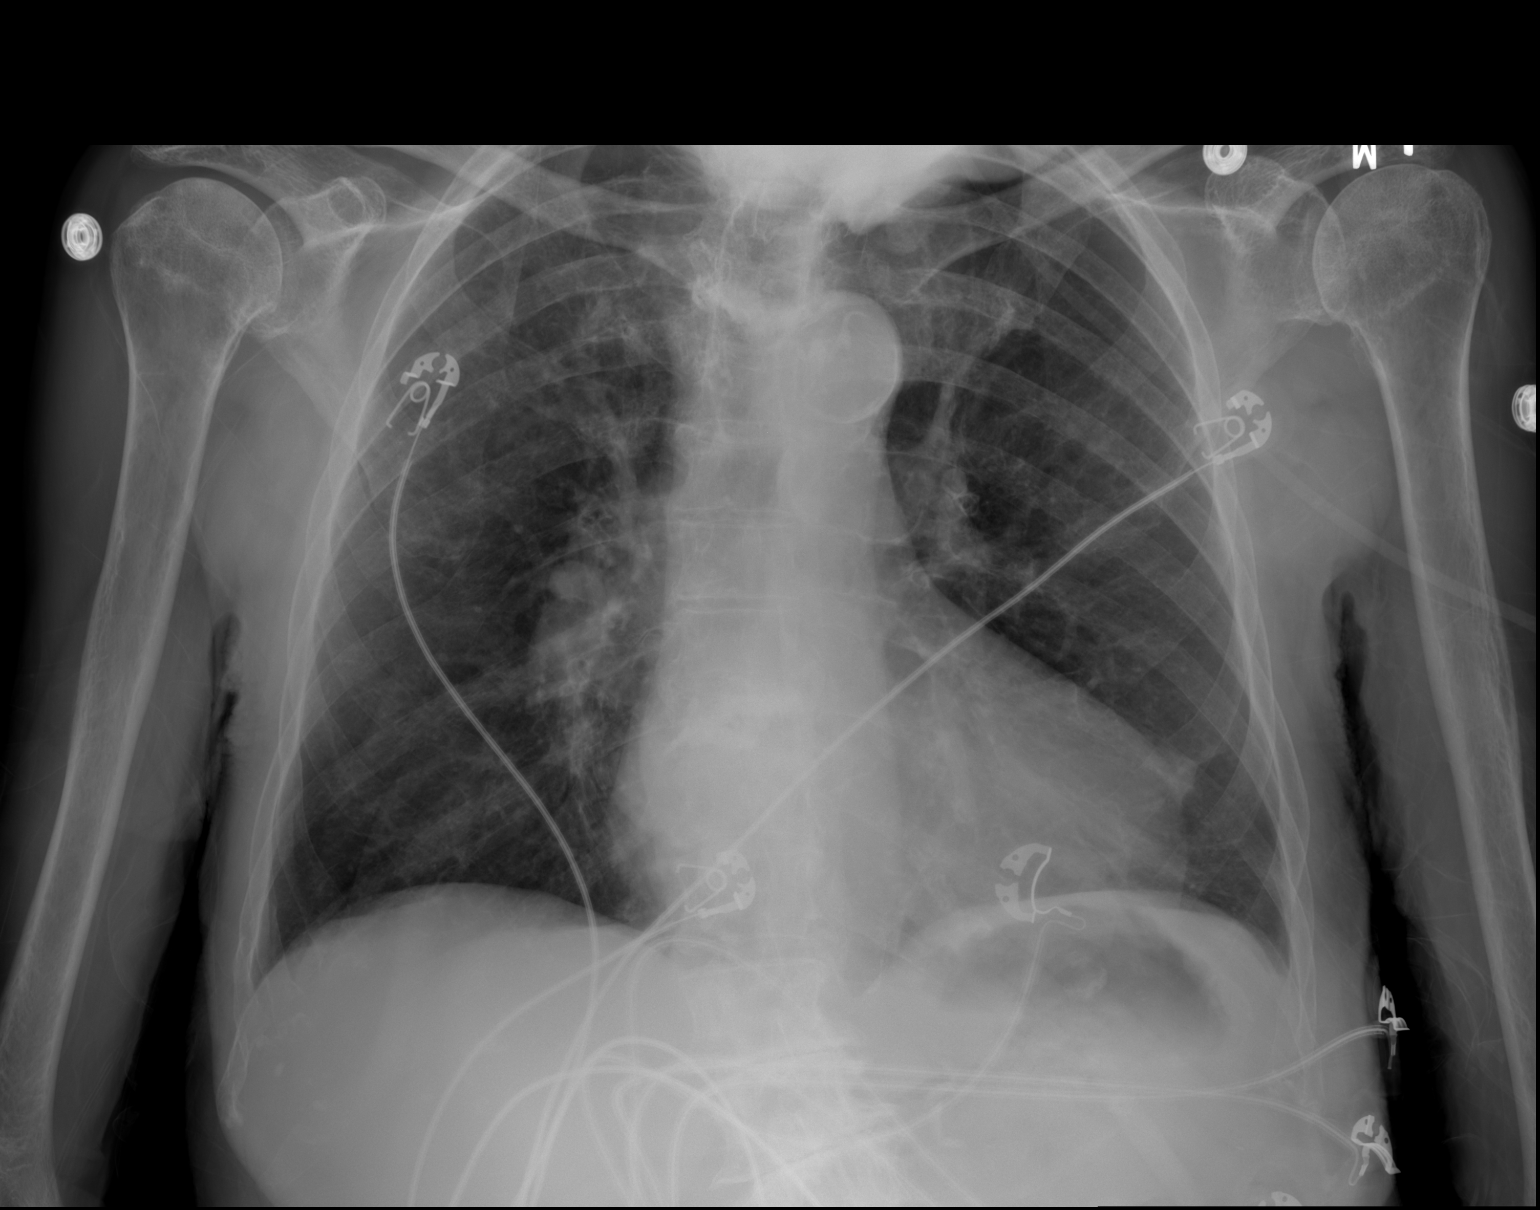

[2 of 2 positions shown; findings below may reference images not displayed]

FINDINGS: Cardiac silhouette is borderline enlarged. No mediastinal or hilar
masses or convincing adenopathy.

Lungs are clear.  No pleural effusion or pneumothorax.

Bony thorax is demineralized. There are chronic changes from
previous upper lower thoracic spine vertebroplasty.
IMPRESSION: No acute cardiopulmonary disease.
